# Patient Record
Sex: Female | Born: 1960 | Race: Black or African American | Hispanic: No
Health system: Western US, Academic
[De-identification: ages and names within clinical notes are randomized; demographics above are authoritative.]

## PROBLEM LIST (undated history)

## (undated) DIAGNOSIS — F4310 Post-traumatic stress disorder, unspecified: Secondary | ICD-10-CM

## (undated) DIAGNOSIS — E049 Nontoxic goiter, unspecified: Secondary | ICD-10-CM

## (undated) DIAGNOSIS — M1990 Unspecified osteoarthritis, unspecified site: Secondary | ICD-10-CM

## (undated) DIAGNOSIS — D573 Sickle-cell trait: Secondary | ICD-10-CM

## (undated) DIAGNOSIS — F319 Bipolar disorder, unspecified: Secondary | ICD-10-CM

## (undated) DIAGNOSIS — R7303 Prediabetes: Secondary | ICD-10-CM

## (undated) DIAGNOSIS — E785 Hyperlipidemia, unspecified: Secondary | ICD-10-CM

## (undated) DIAGNOSIS — D571 Sickle-cell disease without crisis: Secondary | ICD-10-CM

## (undated) DIAGNOSIS — F329 Major depressive disorder, single episode, unspecified: Secondary | ICD-10-CM

## (undated) DIAGNOSIS — T7840XA Allergy, unspecified, initial encounter: Secondary | ICD-10-CM

## (undated) DIAGNOSIS — L4050 Arthropathic psoriasis, unspecified: Secondary | ICD-10-CM

## (undated) DIAGNOSIS — F32A Depression, unspecified: Secondary | ICD-10-CM

## (undated) DIAGNOSIS — L409 Psoriasis, unspecified: Secondary | ICD-10-CM

## (undated) HISTORY — DX: Hyperlipidemia, unspecified: E78.5

## (undated) HISTORY — PX: TONSILLECTOMY: SUR1361

## (undated) HISTORY — DX: Post-traumatic stress disorder, unspecified: F43.10

## (undated) HISTORY — DX: Bipolar disorder, unspecified: F31.9

## (undated) HISTORY — PX: COLONOSCOPY: SHX174

## (undated) HISTORY — DX: Psoriasis, unspecified: L40.9

## (undated) HISTORY — DX: Nontoxic goiter, unspecified: E04.9

## (undated) HISTORY — DX: Arthropathic psoriasis, unspecified: L40.50

## (undated) HISTORY — DX: Unspecified osteoarthritis, unspecified site: M19.90

## (undated) HISTORY — DX: Allergy, unspecified, initial encounter: T78.40XA

## (undated) HISTORY — DX: Sickle-cell trait: D57.3

## (undated) HISTORY — DX: Prediabetes: R73.03

## (undated) HISTORY — DX: Depression, unspecified: F32.A

## (undated) HISTORY — PX: OTHER SURGICAL HISTORY: SHX169

## (undated) HISTORY — PX: WISDOM TOOTH EXTRACTION: SHX21

## (undated) HISTORY — PX: KNEE ARTHROSCOPY: SUR90

## (undated) HISTORY — PX: EYE SURGERY: SHX253

---
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
## 1999-01-21 ENCOUNTER — Other Ambulatory Visit: Admission: RE | Admit: 1999-01-21 | Discharge: 1999-01-21 | Payer: Self-pay | Admitting: *Deleted

## 1999-02-09 ENCOUNTER — Ambulatory Visit (HOSPITAL_COMMUNITY): Admission: RE | Admit: 1999-02-09 | Discharge: 1999-02-09 | Payer: Self-pay | Admitting: *Deleted

## 1999-02-09 ENCOUNTER — Encounter: Payer: Self-pay | Admitting: *Deleted

## 1999-04-04 ENCOUNTER — Ambulatory Visit (HOSPITAL_COMMUNITY): Admission: RE | Admit: 1999-04-04 | Discharge: 1999-04-04 | Payer: Self-pay | Admitting: *Deleted

## 1999-04-04 ENCOUNTER — Encounter (INDEPENDENT_AMBULATORY_CARE_PROVIDER_SITE_OTHER): Payer: Self-pay | Admitting: *Deleted

## 1999-09-15 ENCOUNTER — Other Ambulatory Visit: Admission: RE | Admit: 1999-09-15 | Discharge: 1999-09-15 | Payer: Self-pay | Admitting: *Deleted

## 2005-09-06 ENCOUNTER — Encounter: Admission: RE | Admit: 2005-09-06 | Discharge: 2005-09-06 | Payer: Self-pay | Admitting: Obstetrics and Gynecology

## 2006-11-05 ENCOUNTER — Inpatient Hospital Stay (HOSPITAL_COMMUNITY): Admission: RE | Admit: 2006-11-05 | Discharge: 2006-11-07 | Payer: Self-pay | Admitting: Obstetrics and Gynecology

## 2006-11-05 ENCOUNTER — Encounter (HOSPITAL_COMMUNITY): Payer: Self-pay | Admitting: Obstetrics and Gynecology

## 2008-11-05 ENCOUNTER — Other Ambulatory Visit (HOSPITAL_COMMUNITY): Admission: RE | Admit: 2008-11-05 | Discharge: 2008-11-16 | Payer: Self-pay | Admitting: Psychiatry

## 2008-11-06 ENCOUNTER — Ambulatory Visit: Payer: Self-pay | Admitting: Psychiatry

## 2009-10-22 ENCOUNTER — Other Ambulatory Visit: Admission: RE | Admit: 2009-10-22 | Discharge: 2009-10-22 | Payer: Self-pay | Admitting: Family Medicine

## 2010-08-30 NOTE — H&P (Signed)
NAME:  Nichole Fox, Nichole Fox            ACCOUNT NO.:  [id number]   MEDICAL RECORD NO.:  [id number]          PATIENT TYPE:  AMB   LOCATION:                                F[facility]:  [facility]   PHYSICIAN:  Zelphia Cairo, MD    DATE OF BIRTH:  20-Dec-1960   DATE OF ADMISSION:  11/05/2006  DATE OF DISCHARGE:                              HISTORY & PHYSICAL   HISTORY:  A 50 year old African-American female who complains of  increasing dysmenorrhea and menorrhagia.  Ultrasound has shown enlarging  fibroids.  The patient presents today for surgical management.   PAST MEDICAL HISTORY:  Negative.   SURGICAL HISTORY:  1. Tonsillectomy.  2. LASIK eye surgery.   ALLERGIES:  Erythromycin.   MEDICATIONS:  Vitex and multivitamin.   OBSTETRICAL HISTORY:  One vaginal delivery, one miscarriage without  complications.  She does have a history of abnormal Pap smears and is  status post cryotherapy.  Her most recent Pap smear showed low-grade  SIL.  Colposcopy has been performed.   FAMILY HISTORY:  Significant for diabetes, high blood pressure, lung  disease, and thyroid disease   PHYSICAL EXAMINATION:  VITAL SIGNS:  On exam height is 5 feet 4 inches,  weight 197, blood pressure 120/76. Hemoglobin 12.3.  HEAD AND NECK EXAM:  Normal.  No thyromegaly or nodularity.  HEART:  Regular rate and rhythm.  LUNGS:  Clear bilaterally.  ABDOMEN:  Soft, nontender, nondistended.  PELVIC EXAM:  Shows normal external female genitalia.  Vagina and cervix  are normal without lesions.  Uterus is enlarged and slightly tender.  No  adnexal masses are identified.  Ultrasound performed Sep 11, 2006 shows  uterine fibroids, two subserosal fibroids measuring 2.6 and 5.2 cm.  There are also 7.3 cm, 3.2 cm, 3.2 cm, 1.5 cm intramural fibroids.  Bilateral ovaries appear normal by ultrasound.   ASSESSMENT/PLAN:  A 50 year old black female with dysmenorrhea and  pelvic pain who presents today for abdominal myomectomy.     Zelphia Cairo, MD  Electronically Signed    GA/MEDQ  D:  10/26/2006  T:  10/28/2006  Job:  [id number]

## 2010-08-30 NOTE — Op Note (Signed)
NAME:  Nichole Fox, Nichole Fox            ACCOUNT NO.:  [id number]   MEDICAL RECORD NO.:  [id number]          PATIENT TYPE:  INP   LOCATION:  9310                          FACILITY:  [facility]   PHYSICIAN:  Zelphia Cairo, MD    DATE OF BIRTH:  Sep 09, 1960   DATE OF PROCEDURE:  11/06/2006  DATE OF DISCHARGE:                               OPERATIVE REPORT   PREOPERATIVE DIAGNOSIS:  1. Menorrhagia.  2. Fibroid uterus.   POSTOPERATIVE DIAGNOSIS:  1. Menorrhagia.  2. Fibroid uterus.   PROCEDURE:  Was abdominal myomectomy.   SURGEON:  Dr. Renaldo Fiddler   ASSISTANT:  Juluis Mire, M.D.   ESTIMATED BLOOD LOSS:  300 mL   COMPLICATIONS:  None.   CONDITION:  Stable and extubated to recovery room.   PROCEDURE:  The patient was taken to the operating room where general  anesthesia was obtained. She was prepped and draped in sterile fashion  and a Foley catheter was inserted sterilely.  A Pfannenstiel skin  incision was made with a scalpel and this was carried down to the  underlying fascia.  The fascia was incised in the midline.  This was  extended laterally using Mayo scissors.  The fascia was then grasped  with Kocher clamps, tented upwards and dissected off the underlying  rectus muscles using the Bovie.  The Kocher clamps were then moved to  the inferior portion of the fascia which were then tented upwards and  the underlying rectus muscles dissected off using the Bovie.  The  peritoneum was then identified and entered sharply using Metzenbaum  scissors.  This was extended superiorly and inferiorly with good  visualization of the bladder. The fibroid uterus was then delivered  through the abdominal incision.  There was noted the patient had one  large approximately 4.5 cm fibroid located adjacent to the left  fallopian tube.  Another large uterine fibroid approximately 4 cm in  size on the anterior uterine wall.  Multiple smaller fibroids were also  noted throughout the endometrium. An incision  at the transverse incision  over the myoma was made using the scalpel.  The myomatas were then  bluntly dissected free of the myometrium.  The Bovie and Metzenbaum  scissors were also used for sharp dissection at the base.  Once all  palpable myomas were removed from the uterine cavity the uterine defects  were closed in a double layer closure using 2-0 Vicryl.  The pelvis was  then irrigated with warm normal saline. Interceed was placed over the  uterine incisions and the uterus was placed back  into the pelvic cavity.  The fascia was then closed in a running locked  stitch using Monocryl and the skin was closed using 3-0 Vicryl  subcuticular stitch.  The patient tolerated the procedure well.  Sponge,  lap, needle and instrument counts were correct x2.  She was taken to the  recovery room in stable condition.      Zelphia Cairo, MD  Electronically Signed     GA/MEDQ  D:  11/06/2006  T:  11/07/2006  Job:  [id number]

## 2011-01-30 LAB — CBC
HCT: 26.9 — ABNORMAL LOW
Hemoglobin: 8.9 — ABNORMAL LOW
MCHC: 33
MCHC: 33
MCV: 82
MCV: 83.2
Platelets: 231
Platelets: 349
RDW: 13.8
RDW: 13.9

## 2011-01-30 LAB — ABO/RH: ABO/RH(D): O POS

## 2011-01-30 LAB — TYPE AND SCREEN: ABO/RH(D): O POS

## 2011-01-30 LAB — PREGNANCY, URINE: Preg Test, Ur: NEGATIVE

## 2015-02-08 DIAGNOSIS — M25569 Pain in unspecified knee: Secondary | ICD-10-CM | POA: Insufficient documentation

## 2015-02-08 DIAGNOSIS — F319 Bipolar disorder, unspecified: Secondary | ICD-10-CM | POA: Insufficient documentation

## 2015-02-08 DIAGNOSIS — F4310 Post-traumatic stress disorder, unspecified: Secondary | ICD-10-CM | POA: Insufficient documentation

## 2015-03-05 DIAGNOSIS — Z9889 Other specified postprocedural states: Secondary | ICD-10-CM | POA: Insufficient documentation

## 2015-07-28 ENCOUNTER — Ambulatory Visit (HOSPITAL_BASED_OUTPATIENT_CLINIC_OR_DEPARTMENT_OTHER)

## 2015-09-08 DIAGNOSIS — L301 Dyshidrosis [pompholyx]: Secondary | ICD-10-CM | POA: Insufficient documentation

## 2015-09-08 DIAGNOSIS — E041 Nontoxic single thyroid nodule: Secondary | ICD-10-CM | POA: Insufficient documentation

## 2015-09-08 DIAGNOSIS — L732 Hidradenitis suppurativa: Secondary | ICD-10-CM | POA: Insufficient documentation

## 2016-08-31 NOTE — Telephone Encounter (Signed)
Call to patient, Patient is due for health care maintenance, mammogram and pap.  Pt needs appointment with new PCP for transfer  Of care first per office practice.  Left message to call back.

## 2016-09-12 NOTE — Telephone Encounter (Signed)
Pt returned my call.  We are no longer the PCP.  EMR updated

## 2017-09-26 ENCOUNTER — Ambulatory Visit: Admitting: Family

## 2017-09-26 LAB — HX LIPID PANEL
CASE NUMBER: 2019163001148
HX CHOL: 200 mg/dL — NL
HX HDL: 64 mg/dL — NL
HX LDL: 116 mg/dL — NL
HX TRIG: 98 mg/dL — NL

## 2017-09-26 LAB — HX HEMOGLOBIN A1C
CASE NUMBER: 2019163001148
HX EST AVERAGE GLUCOSE (EAG): 131 mg/dL
HX HBF (INTERNAL): 1.1 % — NL
HX HEMOGLOBIN A1C: 6.2 % — ABNORMAL HIGH
HX LA1C (INTERNAL): 1.3 % — NL
HX P3 PEAK (INTERNAL): 2.4 % — NL
HX P4 PEAK (INTERNAL): 1 % — NL
HX TOTAL AREA RANGE (INTERNAL): 1.66 microvolt/sec — NL (ref 1.0–3.5)
HX VARIANT WINDOW: 33.4 % — ABNORMAL HIGH (ref 0.0–1.0)

## 2017-09-26 LAB — HX COMPREHENSIVE METABOLIC PANEL
CASE NUMBER: 2019163001148
HX ALBUMIN LVL: 4 g/dL — NL (ref 3.2–5.0)
HX ALKALINE PHOSPHATASE: 108 U/L — NL (ref 30.0–117.0)
HX ALT: 35 U/L — NL (ref 6.0–55.0)
HX ANION GAP: 3 — NL (ref 3.0–11.0)
HX AST: 21 U/L — NL (ref 6.0–40.0)
HX BILIRUBIN TOTAL: 0.3 mg/dL — NL (ref 0.2–1.2)
HX BUN: 13 mg/dL — NL (ref 6.0–20.0)
HX CALCIUM LVL: 9.8 mg/dL — NL (ref 8.5–10.5)
HX CHLORIDE: 108 mmol/L — NL (ref 98.0–110.0)
HX CO2: 29 mmol/L — NL (ref 21.0–32.0)
HX CREATININE: 0.613 mg/dL — NL (ref 0.55–1.3)
HX GLUCOSE LVL: 90 mg/dL — NL (ref 70.0–110.0)
HX POTASSIUM LVL: 3.8 mmol/L — NL (ref 3.6–5.2)
HX SODIUM LVL: 140 mmol/L — NL (ref 136.0–146.0)
HX TOTAL PROTEIN: 6.8 g/dL — NL (ref 6.0–8.4)

## 2017-09-26 LAB — HX HEPATITIS C ANTIBODY
CASE NUMBER: 2019163001148
HX HEP C AB INST: NONREACTIVE
HX HEP C AB MEASURED: <0.02 — NL (ref 0.0–0.8)
HX HEP C AB: NONREACTIVE

## 2017-09-26 LAB — HX TSH REFLEX PANEL (RECOMMENDED)
CASE NUMBER: 2019163001148
HX 3RD GEN TSH: 2.17 u[IU]/mL — NL (ref 0.358–3.74)

## 2017-09-26 LAB — HX CBC W/ INDICES
CASE NUMBER: 2019163001148
HX ABSOLUTE NRBC COUNT: 0 10*3/uL
HX HCT: 39 % — NL (ref 36.0–47.0)
HX HGB: 12.9 g/dL — NL (ref 11.8–16.0)
HX MCH: 27.2 pg — NL (ref 26.0–34.0)
HX MCHC: 33.1 g/dL — NL (ref 31.0–37.0)
HX MCV: 82.1 fL — NL (ref 80.0–100.0)
HX MPV: 10 fL — NL (ref 9.4–12.4)
HX NRBC PERCENT: 0 % — NL
HX PLATELET: 238 10*3/uL — NL (ref 150.0–400.0)
HX RBC: 4.75 10*6/uL — NL (ref 3.9–5.2)
HX RDW-CV: 13.8 % — NL (ref 11.5–14.5)
HX RDW-SD: 40.9 fL — NL (ref 35.0–51.0)
HX WBC: 4.8 10*3/uL — NL (ref 3.7–11.2)

## 2017-09-26 LAB — HX GLOMERULAR FILTRATION RATE (ESTIMATED)
CASE NUMBER: 2019163001148
HX AFN AMER GLOMERULAR FILTRATION RATE: >90
HX NON-AFN AMER GLOMERULAR FILTRATION RATE: >90

## 2017-09-26 LAB — HX HIV 1/2 ANTIGEN/ANTIBODY COMBINATION ASS
CASE NUMBER: 2019163001149
HX HIV-1/HIV-2 AG/AB COMBO SCREEN: NONREACTIVE

## 2017-09-27 LAB — HX SEXUALLY TRAN DIS (AMP PRB)
CASE NUMBER: 2019163001181
HX C TRACHOMATIS DNA: NOT DETECTED — NL
HX N. GONORRHOEAE DNA: NOT DETECTED — NL
HX TOTAL RLU: 9

## 2017-09-27 LAB — HX RPR
CASE NUMBER: 2019163001148
HX RPR QUAL: NONREACTIVE — NL

## 2017-12-28 ENCOUNTER — Ambulatory Visit (HOSPITAL_BASED_OUTPATIENT_CLINIC_OR_DEPARTMENT_OTHER): Admitting: "Endocrinology

## 2017-12-28 NOTE — Progress Notes (Signed)
Electronically signed by: Geannie Risen on 12/29/2017 12:03 PM    ATTENTION: Wess Botts M.D.  From and electronically signed by: Geannie Risen 12/29/2017 12:03 PM  Electronically signed by: Wess Botts M.D. on 12/30/2017 2:32 PM

## 2017-12-30 ENCOUNTER — Ambulatory Visit (HOSPITAL_BASED_OUTPATIENT_CLINIC_OR_DEPARTMENT_OTHER): Admitting: "Endocrinology

## 2017-12-30 NOTE — Progress Notes (Signed)
Electronically signed by: Geannie Risen on 12/31/2017 8:49 AM    ATTENTION: Wess Botts M.D.  From and electronically signed by: Geannie Risen 12/31/2017 8:49 AM  Electronically signed by: Wess Botts M.D. on 12/31/2017 3:08 PM

## 2018-08-19 ENCOUNTER — Ambulatory Visit: Payer: Self-pay | Admitting: Family Medicine

## 2018-10-07 ENCOUNTER — Telehealth: Payer: Self-pay | Admitting: Family Medicine

## 2018-10-07 NOTE — Telephone Encounter (Signed)
Records received from [facility]. Pt was scheduled for new appt and was cancelled due to [place]. Paper chart was made with records.

## 2018-10-28 ENCOUNTER — Ambulatory Visit: Payer: Self-pay | Admitting: Family Medicine

## 2018-11-18 ENCOUNTER — Ambulatory Visit (INDEPENDENT_AMBULATORY_CARE_PROVIDER_SITE_OTHER): Payer: Medicare Other | Admitting: Family Medicine

## 2018-11-18 ENCOUNTER — Other Ambulatory Visit: Payer: Self-pay

## 2018-11-18 ENCOUNTER — Encounter: Payer: Self-pay | Admitting: Family Medicine

## 2018-11-18 VITALS — BP 138/84 | HR 102 | Temp 98.0°F | Resp 17 | Ht 64.0 in | Wt 241.8 lb

## 2018-11-18 DIAGNOSIS — R632 Polyphagia: Secondary | ICD-10-CM | POA: Diagnosis not present

## 2018-11-18 DIAGNOSIS — R7301 Impaired fasting glucose: Secondary | ICD-10-CM

## 2018-11-18 DIAGNOSIS — R635 Abnormal weight gain: Secondary | ICD-10-CM

## 2018-11-18 DIAGNOSIS — E785 Hyperlipidemia, unspecified: Secondary | ICD-10-CM

## 2018-11-18 DIAGNOSIS — F3170 Bipolar disorder, currently in remission, most recent episode unspecified: Secondary | ICD-10-CM | POA: Diagnosis not present

## 2018-11-18 DIAGNOSIS — L4050 Arthropathic psoriasis, unspecified: Secondary | ICD-10-CM

## 2018-11-18 DIAGNOSIS — L409 Psoriasis, unspecified: Secondary | ICD-10-CM | POA: Insufficient documentation

## 2018-11-18 DIAGNOSIS — Z5181 Encounter for therapeutic drug level monitoring: Secondary | ICD-10-CM

## 2018-11-18 MED ORDER — ZOLPIDEM TARTRATE 10 MG PO TABS: 10.0000 mg | ORAL_TABLET | Freq: Every evening | ORAL | 3 refills | Status: DC | PRN

## 2018-11-18 MED ORDER — BETAMETHASONE DIPROPIONATE 0.05 % EX OINT: TOPICAL_OINTMENT | Freq: Two times a day (BID) | CUTANEOUS | 11 refills | Status: DC

## 2018-11-18 MED ORDER — ARIPIPRAZOLE 10 MG PO TABS: 10.0000 mg | ORAL_TABLET | Freq: Every day | ORAL | 0 refills | Status: DC

## 2018-11-18 MED ORDER — VENLAFAXINE HCL ER 150 MG PO CP24: 150.0000 mg | ORAL_CAPSULE | Freq: Every day | ORAL | 1 refills | Status: DC

## 2018-11-18 MED ORDER — FLUTICASONE FUROATE 27.5 MCG/SPRAY NA SUSP: 2.0000 | Freq: Every day | NASAL | 11 refills | Status: DC

## 2018-11-18 MED ORDER — METFORMIN HCL ER 500 MG PO TB24: 1000.0000 mg | ORAL_TABLET | Freq: Two times a day (BID) | ORAL | 3 refills | Status: DC

## 2018-11-18 MED ORDER — CLOBETASOL PROPIONATE 0.05 % EX OINT: 1.0000 "application " | TOPICAL_OINTMENT | Freq: Two times a day (BID) | CUTANEOUS | 11 refills | Status: AC

## 2018-11-18 NOTE — Patient Instructions (Addendum)
If you have lab work done today you will be contacted with your lab results within the next 2 weeks.  If you have not heard from us then please contact us. The fastest way to get your results is to register for My Chart.   IF you received an x-ray today, you will receive an invoice from [facility]. Please contact [facility] at [phone] with questions or concerns regarding your invoice.   IF you received labwork today, you will receive an invoice from [facility]. Please contact [facility] at [phone] with questions or concerns regarding your invoice.   Our billing staff will not be able to assist you with questions regarding bills from these companies.  You will be contacted with the lab results as soon as they are available. The fastest way to get your results is to activate your My Chart account. Instructions are located on the last page of this paperwork. If you have not heard from us regarding the results in 2 weeks, please contact this office.     Managing Bipolar Disorder When someone is diagnosed with bipolar disorder, the person may be relieved to now know why he or she has felt or behaved a certain way. The person may also feel overwhelmed about the treatment ahead, how to get needed support, and how to deal with the condition each day. With care and support, a person with bipolar disorder can learn to manage his or her symptoms and live with the condition. How to manage lifestyle changes Managing stress Stress is your body's reaction to life changes and events, both good and bad. Stress can play a major role in bipolar disorder, so it is important to learn how to manage stress. Some techniques to help you manage stress include:  Meditation, muscle relaxation, and breathing exercises.  Exercise. Even a short daily walk can help to lower stress levels.  Getting enough good-quality sleep. Too little sleep can cause mania to start (can trigger  mania).  Making a schedule to manage your time. Knowing your daily schedule can help to keep you from feeling overwhelmed by tasks and deadlines.  Spending time on hobbies you enjoy.  Medicines Your health care provider may suggest certain medicines if he or she feels that they will help improve your condition. Avoid using caffeine, alcohol, and other substances that may prevent your medicines from working properly. It is also important to:  Talk with your pharmacist or health care provider about all the medicines that you take, their possible side effects, and which medicines are safe to take together.  Make it your goal to take part in all treatment decisions (shared decision-making). Ask about possible side effects of medicines that your health care provider recommends, and tell him or her how you feel about having those side effects. It is best if shared decision-making with your health care provider is part of your total treatment plan. If you are taking medicines as part of your treatment, do not stop taking medicines before you ask your health care provider if it is safe to stop. You may need to have the medicine slowly decreased (tapered) over time to lower the risk of harmful side effects. Relationships Spend time with people whom you trust and with whom you feel a sense of understanding and calm. Try to find friends or family members who make you feel safe and can help you control feelings of mania. Consider going to couples counseling, family education classes, or family therapy to:  Educate your loved ones about your condition and offer suggestions about how they can support you.  Help resolve conflicts.  Help develop communication skills in your relationships.  How to recognize changes in your condition Everyone responds differently to treatment for bipolar disorder. Some signs that your condition is improving include:  Leveling of your mood. You may have less anger and  excitement about daily activities, and your low moods may not be as bad.  Your symptoms being less intense.  Feeling calm more often.  Thinking clearly.  Not experiencing consequences for extreme behavior.  Feeling like your life is settling down.  Your behavior seeming more normal to you and to other people. Some signs that your condition may be getting worse include:  Sleep problems.  Moods cycling between deep lows and unusually high (excess) energy.  Extreme emotions.  More anger at loved ones.  Staying away from others, or isolating yourself.  A feeling of power or superiority.  Completing a lot of tasks in a very short amount of time.  Unusual thoughts and behaviors.  Suicidal thoughts. Follow these instructions at home: Medicines  Take over-the-counter and prescription medicines only as told by your health care provider or pharmacist.  Ask your pharmacist what over-the-counter cold medicines you should avoid. Some medicines can make symptoms worse. General instructions  Ask for support from trusted family members or friends to make sure you stay on track with your treatment.  Keep a journal to write down your daily moods, medicines, sleep habits, and life events. This may help you have more success with your treatment.  Make and follow a routine for daily meal times. Eat healthy foods, such as whole grains, vegetables, and fresh fruit.  Try to go to sleep and wake up around the same time every day.  Keep all follow-up visits as told by your health care provider. This is important. Where to find support Talking to others  Try making a list of the people you may want to tell about your condition, such as the people you trust most.  Plan what you are willing and not willing to talk about. Think about your needs ahead of time, and how your friends and family members can support you.  Let your loved ones know when they can share advice and when you would just  like them to listen.  Give your loved ones information about bipolar disorder, and encourage them to learn about the condition. Finances Not all insurance plans cover mental health care, so it is important to check with your insurance carrier. If paying for co-pays or counseling services is a problem, search for a local or county mental health care center. Public mental health care services may be offered there at a low cost or no cost when you are not able to see a private health care provider. If you are taking medicine for depression, you may be able to get the generic form, which may be less expensive than brand-name medicine. Some makers of prescription medicines also offer help to patients who cannot afford the medicines they need. Questions to ask your health care provider:  If you are taking medicines: ? How long do I need to take medicine? ? Are there any long-term side effects of my medicine? ? Are there any alternatives to taking medicine?  How would I benefit from therapy?  How often should I follow up with a health care provider? Contact a health care provider if:  Your symptoms get worse  or they do not get better with treatment. Get help right away if:  You have thoughts about harming yourself or others. If you ever feel like you may hurt yourself or others, or have thoughts about taking your own life, get help right away. You can go to your nearest emergency department or call:  Your local emergency services (911 in the [place]  A suicide crisis helpline, such as the National Suicide Prevention Lifeline at 434 269 39581-413-501-9306. This is open 24-hours a day. Summary  Learning ways to manage stress can help to calm you and may also help your treatment work better.  There is a wide range of medicines that can help to treat bipolar disorder.  Having healthy relationships can help to make your moods more stable.  Contact a health care provider if your symptoms get worse or they  do not get better with treatment. This information is not intended to replace advice given to you by your health care provider. Make sure you discuss any questions you have with your health care provider. Document Released: 08/03/2016 Document Revised: 07/26/2018 Document Reviewed: 08/03/2016 Elsevier Patient Education  2020 [employer]

## 2018-11-18 NOTE — Progress Notes (Signed)
New Patient Office Visit  Subjective:  Patient ID: Nichole Fox, female    DOB: 10-Jul-1960  Age: 59 y.o. MRN: [id number]  CC:  Chief Complaint  Patient presents with  . New Patient (Initial Visit)    establish care.  Needs referral for mental health and endocrinology and rheumatology  . Medication Refill    90 day supply on all: venlafaxine, clobetasol, betamethasone, aripiprazole metformin, zolpidem and fluticasone    HPI [name] presents for   Weight Gain Patient reports a 30# weight gain She reports that she is not exercising due to chronic knee issues but feels like she is hungrier than before and is eating more She would like something to cut her appetite She is on metformin for her sugars which incrased on ability '10mg'$  She denies polyuria, polydipsia, +polyphagia  Bipolar Disorder She reports that she is now stable on the venlafaxine and abilify She reports that she previously had depression and prior suicide attempts years ago requiring hospitalization She would like a referral to Psych for counseling and psychiatry   Psoriasis She has a history of rash on her feet and hands are affected by psoriasis She also has psoriatic arthritis She reports that she would like a referral to Rheumatology She is using Clobetasol and Betamethasone for her psoriasis She takes ibuprofen and tylenol when her arthritis gets bad. She also uses aspercreme. She reports that she is on hydroxychloroquine   Past Medical History:  Diagnosis Date  . Allergy   . Arthritis   . Depression   . Psoriasis   . Psoriatic arthritis ([place])   . Sickle cell anemia ([facility])    trait    Past Surgical History:  Procedure Laterality Date  . EYE SURGERY     lasik  . TONSILLECTOMY      Family History  Problem Relation Age of Onset  . Diabetes Mother   . Depression Mother   . Mental illness Mother   . Diabetes Sister     Social History   Socioeconomic History  . Marital status:  Single    Spouse name: Not on file  . Number of children: Not on file  . Years of education: Not on file  . Highest education level: Not on file  Occupational History    Comment: on disability due to mood disorder  Social Needs  . Financial resource strain: Not hard at all  . Food insecurity    Worry: Never true    Inability: Never true  . Transportation needs    Medical: No    Non-medical: No  Tobacco Use  . Smoking status: Never Smoker  . Smokeless tobacco: Never Used  Substance and Sexual Activity  . Alcohol use: Yes    Alcohol/week: 1.0 standard drinks    Types: 1 Glasses of wine per week    Comment: socially  . Drug use: Never  . Sexual activity: Not Currently  Lifestyle  . Physical activity    Days per week: 0 days    Minutes per session: 0 min  . Stress: To some extent  Relationships  . Social connections    Talks on phone: More than three times a week    Gets together: More than three times a week    Attends religious service: More than 4 times per year    Active member of club or organization: Yes    Attends meetings of clubs or organizations: More than 4 times per year    Relationship status:  Divorced  . Intimate partner violence    Fear of current or ex partner: No    Emotionally abused: No    Physically abused: No    Forced sexual activity: No  Other Topics Concern  . Not on file  Social History Narrative  . Not on file    ROS Review of Systems Review of Systems  Constitutional: Negative for activity change, appetite change, chills and fever.  HENT: Negative for congestion, nosebleeds, trouble swallowing and voice change.   Respiratory: Negative for cough, shortness of breath and wheezing.   Gastrointestinal: Negative for diarrhea, nausea and vomiting.  Genitourinary: Negative for difficulty urinating, dysuria, flank pain and hematuria.  Musculoskeletal: Negative for back pain, joint swelling and neck pain.  Neurological: Negative for dizziness,  speech difficulty, light-headedness and numbness.  See HPI. All other review of systems negative.   Objective:   Today's Vitals: BP 138/84 (BP Location: Left Arm, Patient Position: Sitting, Cuff Size: Large)   Pulse (!) 102   Temp 98 F (36.7 C) (Oral)   Resp 17   Ht '5\' 4"'$  (1.626 m)   Wt 241 lb 12.8 oz (109.7 kg)   SpO2 95%   BMI 41.50 kg/m   Physical Exam Physical Exam  Constitutional: Oriented to person, place, and time. Appears well-developed and well-nourished.  HENT:  Head: Normocephalic and atraumatic.  Eyes: Conjunctivae and EOM are normal.  Cardiovascular: Normal rate, regular rhythm, normal heart sounds and intact distal pulses.  No murmur heard. Pulmonary/Chest: Effort normal and breath sounds normal. No stridor. No respiratory distress. Has no wheezes.  Neurological: Is alert and oriented to person, place, and time.  Skin: Skin is warm. Capillary refill takes less than 2 seconds.  Psychiatric: Has a normal mood and affect. Behavior is normal. Judgment and thought content normal.   Assessment & Plan:   Problem List Items Addressed This Visit      Musculoskeletal and Integument   Psoriatic arthritis ([place]) - Primary - will refer to Rheumatology for continuity   Relevant Orders   Ambulatory referral to Rheumatology   CBC     Other   Bipolar affective disorder ([place])  - discussed that she should establish with psychiatry and psychology Patient informed that her records indicate that she has Bipolar disorder and not just depression   Relevant Orders   Ambulatory referral to Psychiatry   Ambulatory referral to Psychology   Lipid panel   Hemoglobin A1c   CMP14+EGFR   TSH   CBC    Other Visit Diagnoses    Increased appetite    -  Discussed that stimulant meds like phentermine can trigger mania Would recommend a weight watchers approach   Relevant Orders   Lipid panel   Hemoglobin A1c   CMP14+EGFR   TSH   CBC   Weight gain    - will assess for diabetes    Relevant Orders   Lipid panel   Hemoglobin A1c   CMP14+EGFR   TSH   Impaired fasting glucose       Relevant Orders   Lipid panel   Hemoglobin A1c   CMP14+EGFR   TSH   Encounter for medication monitoring       Relevant Orders   Lipid panel   Hemoglobin A1c   CMP14+EGFR   TSH   CBC   Morbid obesity ([place])    - discussed weight watchers   Relevant Medications   metFORMIN (GLUCOPHAGE-XR) 500 MG 24 hr tablet  Outpatient Encounter Medications as of 11/18/2018  Medication Sig  . ARIPiprazole (ABILIFY) 10 MG tablet Take 1 tablet (10 mg total) by mouth daily.  . betamethasone dipropionate (DIPROLENE) 0.05 % ointment Apply topically 2 (two) times daily.  . clobetasol ointment (TEMOVATE) 1.61 % Apply 1 application topically 2 (two) times daily.  . fluticasone (VERAMYST) 27.5 MCG/SPRAY nasal spray Place 2 sprays into the nose daily.  Marland Kitchen venlafaxine XR (EFFEXOR-XR) 150 MG 24 hr capsule Take 1 capsule (150 mg total) by mouth daily with breakfast.  . zolpidem (AMBIEN) 10 MG tablet Take 1 tablet (10 mg total) by mouth at bedtime as needed for sleep.  . [DISCONTINUED] ARIPiprazole (ABILIFY) 10 MG tablet Take 10 mg by mouth daily.  . [DISCONTINUED] betamethasone dipropionate (DIPROLENE) 0.05 % ointment Apply topically 2 (two) times daily.  . [DISCONTINUED] clobetasol ointment (TEMOVATE) 0.96 % Apply 1 application topically 2 (two) times daily.  . [DISCONTINUED] fluticasone (VERAMYST) 27.5 MCG/SPRAY nasal spray Place 2 sprays into the nose daily.  . [DISCONTINUED] metFORMIN (GLUMETZA) 1000 MG (MOD) 24 hr tablet Take 1,000 mg by mouth daily with breakfast.  . [DISCONTINUED] venlafaxine XR (EFFEXOR-XR) 150 MG 24 hr capsule Take 150 mg by mouth daily with breakfast.  . [DISCONTINUED] zolpidem (AMBIEN) 10 MG tablet Take 10 mg by mouth at bedtime as needed for sleep.  . metFORMIN (GLUCOPHAGE-XR) 500 MG 24 hr tablet Take 2 tablets (1,000 mg total) by mouth 2 (two) times daily with breakfast and lunch.    No facility-administered encounter medications on file as of 11/18/2018.     Follow-up: No follow-ups on file.   Forrest Moron, MD

## 2018-11-19 ENCOUNTER — Telehealth: Payer: Self-pay | Admitting: Family Medicine

## 2018-11-19 ENCOUNTER — Encounter: Payer: Self-pay | Admitting: Family Medicine

## 2018-11-19 DIAGNOSIS — E785 Hyperlipidemia, unspecified: Secondary | ICD-10-CM | POA: Insufficient documentation

## 2018-11-19 LAB — TSH: TSH: 1.2 u[IU]/mL (ref 0.450–4.500)

## 2018-11-19 LAB — CMP14+EGFR
ALT: 46 IU/L — ABNORMAL HIGH (ref 0–32)
AST: 33 IU/L (ref 0–40)
Albumin/Globulin Ratio: 2.4 — ABNORMAL HIGH (ref 1.2–2.2)
Albumin: 4.7 g/dL (ref 3.8–4.9)
Alkaline Phosphatase: 110 IU/L (ref 39–117)
BUN/Creatinine Ratio: 16 (ref 9–23)
BUN: 9 mg/dL (ref 6–24)
Bilirubin Total: 0.3 mg/dL (ref 0.0–1.2)
CO2: 24 mmol/L (ref 20–29)
Calcium: 10.1 mg/dL (ref 8.7–10.2)
Chloride: 105 mmol/L (ref 96–106)
Creatinine, Ser: 0.58 mg/dL (ref 0.57–1.00)
GFR calc Af Amer: 119 mL/min/{1.73_m2} (ref >59)
GFR calc non Af Amer: 103 mL/min/{1.73_m2} (ref >59)
Globulin, Total: 2 g/dL (ref 1.5–4.5)
Glucose: 88 mg/dL (ref 65–99)
Potassium: 3.9 mmol/L (ref 3.5–5.2)
Sodium: 144 mmol/L (ref 134–144)
Total Protein: 6.7 g/dL (ref 6.0–8.5)

## 2018-11-19 LAB — CBC
Hematocrit: 38.9 % (ref 34.0–46.6)
Hemoglobin: 12.2 g/dL (ref 11.1–15.9)
MCH: 26.8 pg (ref 26.6–33.0)
MCHC: 31.4 g/dL — ABNORMAL LOW (ref 31.5–35.7)
MCV: 86 fL (ref 79–97)
Platelets: 244 10*3/uL (ref 150–450)
RBC: 4.55 x10E6/uL (ref 3.77–5.28)
RDW: 14.3 % (ref 11.7–15.4)
WBC: 5.8 10*3/uL (ref 3.4–10.8)

## 2018-11-19 LAB — LIPID PANEL
Chol/HDL Ratio: 4.2 ratio (ref 0.0–4.4)
Cholesterol, Total: 250 mg/dL — ABNORMAL HIGH (ref 100–199)
HDL: 60 mg/dL (ref >39)
LDL Calculated: 171 mg/dL — ABNORMAL HIGH (ref 0–99)
Triglycerides: 94 mg/dL (ref 0–149)
VLDL Cholesterol Cal: 19 mg/dL (ref 5–40)

## 2018-11-19 LAB — HEMOGLOBIN A1C
Est. average glucose Bld gHb Est-mCnc: 120 mg/dL
Hgb A1c MFr Bld: 5.8 % — ABNORMAL HIGH (ref 4.8–5.6)

## 2018-11-19 NOTE — Telephone Encounter (Signed)
Sent My-Chart message

## 2018-11-20 ENCOUNTER — Other Ambulatory Visit: Payer: Self-pay

## 2018-11-20 DIAGNOSIS — Z20822 Contact with and (suspected) exposure to covid-19: Secondary | ICD-10-CM

## 2018-11-22 ENCOUNTER — Encounter: Payer: Self-pay | Admitting: Family Medicine

## 2018-11-22 LAB — NOVEL CORONAVIRUS, NAA: SARS-CoV-2, NAA: NOT DETECTED

## 2018-11-24 ENCOUNTER — Encounter: Payer: Self-pay | Admitting: Family Medicine

## 2018-11-25 ENCOUNTER — Telehealth: Payer: Self-pay | Admitting: Family Medicine

## 2018-11-25 ENCOUNTER — Other Ambulatory Visit: Payer: Self-pay | Admitting: Family Medicine

## 2018-11-25 ENCOUNTER — Encounter: Payer: Self-pay | Admitting: Family Medicine

## 2018-11-25 DIAGNOSIS — R635 Abnormal weight gain: Secondary | ICD-10-CM

## 2018-11-25 NOTE — Telephone Encounter (Signed)
Patient is calling to request through her [place] that has a program renew active that will support her going to the gym.  The patient is requesting a prescription to the [employer] to work out.  Patient is requesting a call when the prescription is ready for pick up.  Pleae advise CB- [phone]

## 2018-11-25 NOTE — Telephone Encounter (Signed)
Medication: betamethasone dipropionate (DIPROLENE) 0.05 % ointment [[id number]]  Patient states that she should be receiving 45 g  ° °Has the patient contacted their pharmacy? Yes  °(Agent: If no, request that the patient contact the pharmacy for the refill.) °(Agent: If yes, when and what did the pharmacy advise?) ° °Preferred Pharmacy (with phone number or street name):  °[id number] - [place], [place] - [address] [phone] (Phone) °[phone] (Fax) ° ° °Agent: Please be advised that RX refills may take up to 3 business days. We ask that you follow-up with your pharmacy. °

## 2018-11-25 NOTE — Telephone Encounter (Signed)
Requested medications are due for refill today?  No  Requested medications are on the active medication list?  Yes  Last refill: 11/18/2018  Future visit scheduled?  Yes- 02/18/2019  Notes to clinic: Patient is requesting 45 gram tube.   Requested Prescriptions  Pending Prescriptions Disp Refills   betamethasone dipropionate (DIPROLENE) 0.05 % ointment 30 g 11    Sig: Apply topically 2 (two) times daily.     Off-Protocol Failed - 11/25/2018  3:00 PM      Failed - Medication not assigned to a protocol, review manually.      Passed - Valid encounter within last 12 months    Recent Outpatient Visits          1 week ago Psoriatic arthritis [facility])   [facility] [place], [place]      Future Appointments            In 1 month Deveshwar, Abel Presto, MD [place]   In 2 months Forrest Moron, MD [facility] at [place], [facility]

## 2018-11-25 NOTE — Telephone Encounter (Signed)
Medication: betamethasone dipropionate (DIPROLENE) 0.05 % ointment [[id number]]  Patient states that she should be receiving 45 g   Has the patient contacted their pharmacy? Yes  (Agent: If no, request that the patient contact the pharmacy for the refill.) (Agent: If yes, when and what did the pharmacy advise?)  Preferred Pharmacy (with phone number or street name):  [facility] [phone] - [place], [place] - [address] [phone] (Phone) [phone] (Fax)   Agent: Please be advised that RX refills may take up to 3 business days. We ask that you follow-up with your pharmacy.

## 2018-11-27 MED ORDER — BETAMETHASONE DIPROPIONATE 0.05 % EX OINT: TOPICAL_OINTMENT | Freq: Two times a day (BID) | CUTANEOUS | 6 refills | Status: DC

## 2018-11-27 NOTE — Telephone Encounter (Signed)
Completed paper script. Please scan a copy and call the patient to pick up the original.

## 2018-11-27 NOTE — Addendum Note (Signed)
Addended by: Delia Chimes A on: 11/27/2018 02:44 PM   Modules accepted: Orders

## 2018-11-27 NOTE — Telephone Encounter (Signed)
New tube sent in with refills to pharmacy on file. Please notify the patient.

## 2018-11-27 NOTE — Telephone Encounter (Signed)
Tried contacting pt no answer and no voicemail option. Copy made for scanning and original with Delores. Dgaddy, CMA

## 2018-12-01 NOTE — Telephone Encounter (Signed)
Pt picked up original script on 11/29/2018.

## 2018-12-03 ENCOUNTER — Ambulatory Visit (INDEPENDENT_AMBULATORY_CARE_PROVIDER_SITE_OTHER): Payer: Medicare Other | Admitting: Psychology

## 2018-12-03 DIAGNOSIS — F3189 Other bipolar disorder: Secondary | ICD-10-CM

## 2018-12-12 ENCOUNTER — Ambulatory Visit (INDEPENDENT_AMBULATORY_CARE_PROVIDER_SITE_OTHER): Payer: Medicare Other | Admitting: Psychology

## 2018-12-12 DIAGNOSIS — F3189 Other bipolar disorder: Secondary | ICD-10-CM

## 2018-12-15 ENCOUNTER — Other Ambulatory Visit: Payer: Self-pay | Admitting: Family Medicine

## 2018-12-18 NOTE — Progress Notes (Signed)
Office Visit Note  Patient: Nichole Fox Wilbert             Date of Birth: 10/11/1960           MRN: [id number]             PCP: Doristine BosworthStallings, Zoe A, MD Referring: Doristine BosworthStallings, Zoe A, MD Visit Date: 01/01/2019 Occupation: disability  Subjective:  Pain in multiple joints.   History of Present Illness: Nichole Fox Rotert is a 58 y.o. female seen in consultation per request of her PCP.  According to patient she started having rashes when she was a child and was diagnosed with eczema.  In her 720s she was seen by dermatologist who diagnosed her with psoriasis.  She was given topical agents and light therapy for many years.  She has not been under care of a dermatologist recently.  She states for the last 1 year she has been experiencing increased pain in her hands.  She states her hands lock up at times.  She has not seen any visible swelling in her hands.  She also gives history of intermittent pain in her left knee joint where she had meniscal tear repair in the past.  She has lower back pain for the last month.  She states she has gained about 30 pounds since the onset of the pandemic and it is hard for her to tell if there is any swelling in her joints.  She continues to have psoriasis flares.  She also gives history of hidradenitis involving her inguinal region.  She states she has recurrent flares.  Last flare was last month.  She usually uses topical clindamycin to clear it up.  Activities of Daily Living:  Patient reports morning stiffness for several hours.   Patient Denies nocturnal pain.  Difficulty dressing/grooming: Denies Difficulty climbing stairs: Reports Difficulty getting out of chair: Reports Difficulty using hands for taps, buttons, cutlery, and/or writing: Reports  Review of Systems  Constitutional: Positive for fatigue and weight gain. Negative for night sweats and weight loss.  HENT: Negative for mouth sores, trouble swallowing, trouble swallowing, mouth dryness and nose dryness.    Eyes: Negative for pain, redness, itching, visual disturbance and dryness.  Respiratory: Negative for cough, shortness of breath and difficulty breathing.   Cardiovascular: Negative for chest pain, palpitations, hypertension, irregular heartbeat and swelling in legs/feet.  Gastrointestinal: Positive for constipation. Negative for abdominal pain, blood in stool and diarrhea.  Endocrine: Negative for increased urination.  Genitourinary: Negative for difficulty urinating, painful urination and vaginal dryness.  Musculoskeletal: Positive for arthralgias, joint pain and morning stiffness. Negative for joint swelling, myalgias, muscle weakness, muscle tenderness and myalgias.  Skin: Positive for rash. Negative for color change, hair loss, skin tightness, ulcers and sensitivity to sunlight.  Allergic/Immunologic: Negative for susceptible to infections.  Neurological: Positive for memory loss. Negative for dizziness, numbness, headaches, night sweats and weakness.  Hematological: Negative for bruising/bleeding tendency and swollen glands.  Psychiatric/Behavioral: Positive for depressed mood and sleep disturbance. Negative for confusion. The patient is nervous/anxious.     PMFS History:  Patient Active Problem List   Diagnosis Date Noted  . Dyslipidemia 11/19/2018  . Psoriatic arthritis (HCC)   . Thyroid nodule 09/08/2015  . 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 Hidradenitis 09/08/2015  . S/P arthroscopic surgery of left knee 03/05/2015  . Bipolar affective disorder (HCC) 02/08/2015  . PTSD (post-traumatic stress disorder) 02/08/2015  . Lateral knee pain 02/08/2015    Past Medical History:  Diagnosis Date  .  Allergy   . Arthritis   . Depression   . Psoriasis   . Psoriatic arthritis ([place])   . Sickle cell anemia (HCC)    trait    Family History  Problem Relation Age of Onset  . Diabetes Mother   . Depression Mother   . Mental illness Mother   . Thyroid disease Father   .  Anemia Father    Past Surgical History:  Procedure Laterality Date  . abortions     x2  . COLONOSCOPY    . EYE SURGERY     lasik  . TONSILLECTOMY     Social History   Social History Narrative  . Not on file   Immunization History  Administered Date(s) Administered  . Tdap 02/24/2015     Objective: Vital Signs: BP (!) 145/82 (BP Location: Right Wrist, Patient Position: Sitting, Cuff Size: Normal)   Pulse 83   Resp 15   Ht 5\' 5"  (1.651 m)   Wt 245 lb (111.1 kg)   BMI 40.77 kg/m    Physical Exam Vitals signs and nursing note reviewed.  Constitutional:      Appearance: She is well-developed.  HENT:     Head: Normocephalic and atraumatic.  Eyes:     Conjunctiva/sclera: Conjunctivae normal.  Neck:     Musculoskeletal: Normal range of motion.  Cardiovascular:     Rate and Rhythm: Normal rate and regular rhythm.     Heart sounds: Normal heart sounds.  Pulmonary:     Effort: Pulmonary effort is normal.     Breath sounds: Normal breath sounds.  Abdominal:     General: Bowel sounds are normal.     Palpations: Abdomen is soft.  Lymphadenopathy:     Cervical: No cervical adenopathy.  Skin:    General: Skin is warm and dry.     Capillary Refill: Capillary refill takes less than 2 seconds.     Comments: She has hyperpigmented pigmented dry scales on the dorsum of her right foot.  She also has dry scales on her bilateral hands and bilateral feet.  No other lesions were noted.  Neurological:     Mental Status: She is alert and oriented to person, place, and time.  Psychiatric:        Behavior: Behavior normal.      Musculoskeletal Exam: C-spine thoracic and lumbar spine with good range of motion.  She had no SI joint tenderness.  Shoulder joints, elbow joints, wrist joints, MCPs PIPs DIPs with good range of motion.  She had mild tenderness on palpation of her PIP joints of her hands.  Hip joints knee joints, ankles or MTPs PIPs with good range of motion.  She has some  discomfort range of motion of her left knee joint with crepitus.  No warmth swelling or effusion was noted.  CDAI Exam: CDAI Score: - Patient Global: -; Provider Global: - Swollen: -; Tender: - Joint Exam   No joint exam has been documented for this visit   There is currently no information documented on the homunculus. Go to the Rheumatology activity and complete the homunculus joint exam.  Investigation: No additional findings.  Imaging: Xr Hand 2 View Left  Result Date: 01/01/2019 No PIP DIP, CMC, intercarpal radiocarpal joint space narrowing was noted.  No erosive changes were noted. Impression: Unremarkable x-ray of the hand.  Xr Hand 2 View Right  Result Date: 01/01/2019 No MCP, PIP or DIP joint space narrowing was noted.  No intercarpal radiocarpal joint space  narrowing was noted.  No erosive changes were noted. Impression: X-ray was consistent with mild osteoarthritic changes.  Xr Knee 3 View Left  Result Date: 01/01/2019 Moderate medial compartment narrowing with medial osteophytes and intercondylar osteophytes was noted.  No chondrocalcinosis was noted.  Severe patellofemoral narrowing was noted. Impression: These findings are consistent with moderate osteoarthritis and severe chondromalacia patella.   Recent Labs: Lab Results  Component Value Date   WBC 5.8 11/18/2018   HGB 12.2 11/18/2018   PLT 244 11/18/2018   NA 144 11/18/2018   K 3.9 11/18/2018   CL 105 11/18/2018   CO2 24 11/18/2018   GLUCOSE 88 11/18/2018   BUN 9 11/18/2018   CREATININE 0.58 11/18/2018   BILITOT 0.3 11/18/2018   ALKPHOS 110 11/18/2018   AST 33 11/18/2018   ALT 46 (H) 11/18/2018   PROT 6.7 11/18/2018   ALBUMIN 4.7 11/18/2018   CALCIUM 10.1 11/18/2018   GFRAA 119 11/18/2018    Speciality Comments: No specialty comments available.  Procedures:  No procedures performed Allergies: Erythromycin, Lithium, Shellfish allergy, and Strawberry extract   Assessment / Plan:     Visit  Diagnoses: Pain in both hands -patient complains of increased pain and stiffness in her hands for the last 1 year.  The clinical findings and the radiographic findings are consistent with mild osteoarthritis.  I do not see any synovitis on examination.  There were no radiographic changes.  I have advised her to contact me in case she develops any increased swelling.  Plan: XR Hand 2 View Right, XR Hand 2 View Left, I will obtain following labs today.  Sedimentation rate, Rheumatoid factor, Cyclic citrul peptide antibody, IgG, Uric acid, 14-3-3 eta Protein.according to patient Dr. Gwyneth Sprout at [facility] in [place].  Diagnosed her with psoriatic arthritis and started her on Plaquenil.  Chronic pain of left knee -she has moderate osteoarthritis and severe chondromalacia patella on the radiographic examination.  She may benefit from knee joint aches strengthening exercises.  Have given her a handout.  Plan: XR KNEE 3 VIEW LEFT.  Weight loss diet and exercise was discussed.  We will discuss cortisone injection and possible Visco supplement injections at the follow-up visit.  Psoriasis-patient gives history of psoriasis since she was in her 12s.  She has been treated with topical agents and prover therapy.  Per her request I will refer her to dermatology.  Hidradenitis - inguinal, recurrent.  Patient states she really responds to topical clindamycin.  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  S/P arthroscopic surgery of left knee  PTSD (post-traumatic stress disorder)  Bipolar affective disorder in remission (HCC)  Dyslipidemia  Thyroid nodule  Orders: Orders Placed This Encounter  Procedures  . XR Hand 2 View Right  . XR Hand 2 View Left  . XR KNEE 3 VIEW LEFT  . Sedimentation rate  . Rheumatoid factor  . Cyclic citrul peptide antibody, IgG  . Uric acid  . 14-3-3 eta Protein  . Ambulatory referral to Dermatology   No orders of the defined types  were placed in this encounter.   Face-to-face time spent with patient was 45 minutes. Greater than 50% of time was spent in counseling and coordination of care.  Follow-Up Instructions: Return for Osteoarthritis, psoriasis.   Pollyann Savoy, MD  Note - This record has been created using Animal nutritionist.  Chart creation errors have been sought, but may not always  have been located. Such creation errors do not  reflect on  the standard of medical care.

## 2018-12-25 ENCOUNTER — Ambulatory Visit: Payer: Self-pay | Admitting: Psychology

## 2019-01-01 ENCOUNTER — Ambulatory Visit: Payer: Self-pay

## 2019-01-01 ENCOUNTER — Other Ambulatory Visit: Payer: Self-pay

## 2019-01-01 ENCOUNTER — Encounter: Payer: Self-pay | Admitting: Rheumatology

## 2019-01-01 ENCOUNTER — Ambulatory Visit (INDEPENDENT_AMBULATORY_CARE_PROVIDER_SITE_OTHER): Payer: Medicare Other | Admitting: Rheumatology

## 2019-01-01 VITALS — BP 145/82 | HR 83 | Resp 15 | Ht 65.0 in | Wt 245.0 lb

## 2019-01-01 DIAGNOSIS — M79642 Pain in left hand: Secondary | ICD-10-CM | POA: Diagnosis not present

## 2019-01-01 DIAGNOSIS — L409 Psoriasis, unspecified: Secondary | ICD-10-CM | POA: Diagnosis not present

## 2019-01-01 DIAGNOSIS — L732 Hidradenitis suppurativa: Secondary | ICD-10-CM

## 2019-01-01 DIAGNOSIS — G8929 Other chronic pain: Secondary | ICD-10-CM

## 2019-01-01 DIAGNOSIS — E041 Nontoxic single thyroid nodule: Secondary | ICD-10-CM

## 2019-01-01 DIAGNOSIS — M25562 Pain in left knee: Secondary | ICD-10-CM | POA: Diagnosis not present

## 2019-01-01 DIAGNOSIS — L301 Dyshidrosis [pompholyx]: Secondary | ICD-10-CM

## 2019-01-01 DIAGNOSIS — M79641 Pain in right hand: Secondary | ICD-10-CM

## 2019-01-01 DIAGNOSIS — F4310 Post-traumatic stress disorder, unspecified: Secondary | ICD-10-CM

## 2019-01-01 DIAGNOSIS — Z9889 Other specified postprocedural states: Secondary | ICD-10-CM

## 2019-01-01 DIAGNOSIS — F3170 Bipolar disorder, currently in remission, most recent episode unspecified: Secondary | ICD-10-CM

## 2019-01-01 DIAGNOSIS — E785 Hyperlipidemia, unspecified: Secondary | ICD-10-CM

## 2019-01-01 NOTE — Patient Instructions (Signed)
Hand Exercises Hand exercises can be helpful for almost anyone. These exercises can strengthen the hands, improve flexibility and movement, and increase blood flow to the hands. These results can make work and daily tasks easier. Hand exercises can be especially helpful for people who have joint pain from arthritis or have nerve damage from overuse (carpal tunnel syndrome). These exercises can also help people who have injured a hand. Exercises Most of these hand exercises are gentle stretching and motion exercises. It is usually safe to do them often throughout the day. Warming up your hands before exercise may help to reduce stiffness. You can do this with gentle massage or by placing your hands in warm water for 10-15 minutes. It is normal to feel some stretching, pulling, tightness, or mild discomfort as you begin new exercises. This will gradually improve. Stop an exercise right away if you feel sudden, severe pain or your pain gets worse. Ask your health care provider which exercises are best for you. Knuckle bend or "claw" fist 1. Stand or sit with your arm, hand, and all five fingers pointed straight up. Make sure to keep your wrist straight during the exercise. 2. Gently bend your fingers down toward your palm until the tips of your fingers are touching the top of your palm. Keep your big knuckle straight and just bend the small knuckles in your fingers. 3. Hold this position for __________ seconds. 4. Straighten (extend) your fingers back to the starting position. Repeat this exercise 5-10 times with each hand. Full finger fist 1. Stand or sit with your arm, hand, and all five fingers pointed straight up. Make sure to keep your wrist straight during the exercise. 2. Gently bend your fingers into your palm until the tips of your fingers are touching the middle of your palm. 3. Hold this position for __________ seconds. 4. Extend your fingers back to the starting position, stretching every  joint fully. Repeat this exercise 5-10 times with each hand. Straight fist 1. Stand or sit with your arm, hand, and all five fingers pointed straight up. Make sure to keep your wrist straight during the exercise. 2. Gently bend your fingers at the big knuckle, where your fingers meet your hand, and the middle knuckle. Keep the knuckle at the tips of your fingers straight and try to touch the bottom of your palm. 3. Hold this position for __________ seconds. 4. Extend your fingers back to the starting position, stretching every joint fully. Repeat this exercise 5-10 times with each hand. Tabletop 1. Stand or sit with your arm, hand, and all five fingers pointed straight up. Make sure to keep your wrist straight during the exercise. 2. Gently bend your fingers at the big knuckle, where your fingers meet your hand, as far down as you can while keeping the small knuckles in your fingers straight. Think of forming a tabletop with your fingers. 3. Hold this position for __________ seconds. 4. Extend your fingers back to the starting position, stretching every joint fully. Repeat this exercise 5-10 times with each hand. Finger spread 1. Place your hand flat on a table with your palm facing down. Make sure your wrist stays straight as you do this exercise. 2. Spread your fingers and thumb apart from each other as far as you can until you feel a gentle stretch. Hold this position for __________ seconds. 3. Bring your fingers and thumb tight together again. Hold this position for __________ seconds. Repeat this exercise 5-10 times with each hand.   Making circles 1. Stand or sit with your arm, hand, and all five fingers pointed straight up. Make sure to keep your wrist straight during the exercise. 2. Make a circle by touching the tip of your thumb to the tip of your index finger. 3. Hold for __________ seconds. Then open your hand wide. 4. Repeat this motion with your thumb and each finger on your hand.  Repeat this exercise 5-10 times with each hand. Thumb motion 1. Sit with your forearm resting on a table and your wrist straight. Your thumb should be facing up toward the ceiling. Keep your fingers relaxed as you move your thumb. 2. Lift your thumb up as high as you can toward the ceiling. Hold for __________ seconds. 3. Bend your thumb across your palm as far as you can, reaching the tip of your thumb for the small finger (pinkie) side of your palm. Hold for __________ seconds. Repeat this exercise 5-10 times with each hand. Grip strengthening  1. Hold a stress ball or other soft ball in the middle of your hand. 2. Slowly increase the pressure, squeezing the ball as much as you can without causing pain. Think of bringing the tips of your fingers into the middle of your palm. All of your finger joints should bend when doing this exercise. 3. Hold your squeeze for __________ seconds, then relax. Repeat this exercise 5-10 times with each hand. Contact a health care provider if:  Your hand pain or discomfort gets much worse when you do an exercise.  Your hand pain or discomfort does not improve within 2 hours after you exercise. If you have any of these problems, stop doing these exercises right away. Do not do them again unless your health care provider says that you can. Get help right away if:  You develop sudden, severe hand pain or swelling. If this happens, stop doing these exercises right away. Do not do them again unless your health care provider says that you can. This information is not intended to replace advice given to you by your health care provider. Make sure you discuss any questions you have with your health care provider. Document Released: 03/15/2015 Document Revised: 07/25/2018 Document Reviewed: 04/04/2018 Elsevier Patient Education  2020 [employer] Inc[employer] Journal for Nurse Practitioners, 15(4), 263-267. Retrieved January 21, 2018 from http://clinicalkey.com/nursing">  Knee  Exercises Ask your health care provider which exercises are safe for you. Do exercises exactly as told by your health care provider and adjust them as directed. It is normal to feel mild stretching, pulling, tightness, or discomfort as you do these exercises. Stop right away if you feel sudden pain or your pain gets worse. Do not begin these exercises until told by your health care provider. Stretching and range-of-motion exercises These exercises warm up your muscles and joints and improve the movement and flexibility of your knee. These exercises also help to relieve pain and swelling. Knee extension, prone 5. Lie on your abdomen (prone position) on a bed. 6. Place your left / right knee just beyond the edge of the surface so your knee is not on the bed. You can put a towel under your left / right thigh just above your kneecap for comfort. 7. Relax your leg muscles and allow gravity to straighten your knee (extension). You should feel a stretch behind your left / right knee. 8. Hold this position for __________ seconds. 9. Scoot up so your knee is supported between repetitions. Repeat __________ times. Complete this exercise __________ times   a day. Knee flexion, active  5. Lie on your back with both legs straight. If this causes back discomfort, bend your left / right knee so your foot is flat on the floor. 6. Slowly slide your left / right heel back toward your buttocks. Stop when you feel a gentle stretch in the front of your knee or thigh (flexion). 7. Hold this position for __________ seconds. 8. Slowly slide your left / right heel back to the starting position. Repeat __________ times. Complete this exercise __________ times a day. Quadriceps stretch, prone  5. Lie on your abdomen on a firm surface, such as a bed or padded floor. 6. Bend your left / right knee and hold your ankle. If you cannot reach your ankle or pant leg, loop a belt around your foot and grab the belt instead. 7.  Gently pull your heel toward your buttocks. Your knee should not slide out to the side. You should feel a stretch in the front of your thigh and knee (quadriceps). 8. Hold this position for __________ seconds. Repeat __________ times. Complete this exercise __________ times a day. Hamstring, supine 1. Lie on your back (supine position). 2. Loop a belt or towel over the ball of your left / right foot. The ball of your foot is on the walking surface, right under your toes. 3. Straighten your left / right knee and slowly pull on the belt to raise your leg until you feel a gentle stretch behind your knee (hamstring). ? Do not let your knee bend while you do this. ? Keep your other leg flat on the floor. 4. Hold this position for __________ seconds. Repeat __________ times. Complete this exercise __________ times a day. Strengthening exercises These exercises build strength and endurance in your knee. Endurance is the ability to use your muscles for a long time, even after they get tired. Quadriceps, isometric This exercise stretches the muscles in front of your thigh (quadriceps) without moving your knee joint (isometric). 4. Lie on your back with your left / right leg extended and your other knee bent. Put a rolled towel or small pillow under your knee if told by your health care provider. 5. Slowly tense the muscles in the front of your left / right thigh. You should see your kneecap slide up toward your hip or see increased dimpling just above the knee. This motion will push the back of the knee toward the floor. 6. For __________ seconds, hold the muscle as tight as you can without increasing your pain. 7. Relax the muscles slowly and completely. Repeat __________ times. Complete this exercise __________ times a day. Straight leg raises This exercise stretches the muscles in front of your thigh (quadriceps) and the muscles that move your hips (hip flexors). 5. Lie on your back with your left /  right leg extended and your other knee bent. 6. Tense the muscles in the front of your left / right thigh. You should see your kneecap slide up or see increased dimpling just above the knee. Your thigh may even shake a bit. 7. Keep these muscles tight as you raise your leg 4-6 inches (10-15 cm) off the floor. Do not let your knee bend. 8. Hold this position for __________ seconds. 9. Keep these muscles tense as you lower your leg. 10. Relax your muscles slowly and completely after each repetition. Repeat __________ times. Complete this exercise __________ times a day. Hamstring, isometric 4. Lie on your back on a firm surface. 5.   Bend your left / right knee about __________ degrees. 6. Dig your left / right heel into the surface as if you are trying to pull it toward your buttocks. Tighten the muscles in the back of your thighs (hamstring) to "dig" as hard as you can without increasing any pain. 7. Hold this position for __________ seconds. 8. Release the tension gradually and allow your muscles to relax completely for __________ seconds after each repetition. Repeat __________ times. Complete this exercise __________ times a day. Hamstring curls If told by your health care provider, do this exercise while wearing ankle weights. Begin with __________ lb weights. Then increase the weight by 1 lb (0.5 kg) increments. Do not wear ankle weights that are more than __________ lb. 4. Lie on your abdomen with your legs straight. 5. Bend your left / right knee as far as you can without feeling pain. Keep your hips flat against the floor. 6. Hold this position for __________ seconds. 7. Slowly lower your leg to the starting position. Repeat __________ times. Complete this exercise __________ times a day. Squats This exercise strengthens the muscles in front of your thigh and knee (quadriceps). 1. Stand in front of a table, with your feet and knees pointing straight ahead. You may rest your hands on the  table for balance but not for support. 2. Slowly bend your knees and lower your hips like you are going to sit in a chair. ? Keep your weight over your heels, not over your toes. ? Keep your lower legs upright so they are parallel with the table legs. ? Do not let your hips go lower than your knees. ? Do not bend lower than told by your health care provider. ? If your knee pain increases, do not bend as low. 3. Hold the squat position for __________ seconds. 4. Slowly push with your legs to return to standing. Do not use your hands to pull yourself to standing. Repeat __________ times. Complete this exercise __________ times a day. Wall slides This exercise strengthens the muscles in front of your thigh and knee (quadriceps). 1. Lean your back against a smooth wall or door, and walk your feet out 18-24 inches (46-61 cm) from it. 2. Place your feet hip-width apart. 3. Slowly slide down the wall or door until your knees bend __________ degrees. Keep your knees over your heels, not over your toes. Keep your knees in line with your hips. 4. Hold this position for __________ seconds. Repeat __________ times. Complete this exercise __________ times a day. Straight leg raises This exercise strengthens the muscles that rotate the leg at the hip and move it away from your body (hip abductors). 1. Lie on your side with your left / right leg in the top position. Lie so your head, shoulder, knee, and hip line up. You may bend your bottom knee to help you keep your balance. 2. Roll your hips slightly forward so your hips are stacked directly over each other and your left / right knee is facing forward. 3. Leading with your heel, lift your top leg 4-6 inches (10-15 cm). You should feel the muscles in your outer hip lifting. ? Do not let your foot drift forward. ? Do not let your knee roll toward the ceiling. 4. Hold this position for __________ seconds. 5. Slowly return your leg to the starting position.  6. Let your muscles relax completely after each repetition. Repeat __________ times. Complete this exercise __________ times a day. Straight leg raises   This exercise stretches the muscles that move your hips away from the front of the pelvis (hip extensors). 1. Lie on your abdomen on a firm surface. You can put a pillow under your hips if that is more comfortable. 2. Tense the muscles in your buttocks and lift your left / right leg about 4-6 inches (10-15 cm). Keep your knee straight as you lift your leg. 3. Hold this position for __________ seconds. 4. Slowly lower your leg to the starting position. 5. Let your leg relax completely after each repetition. Repeat __________ times. Complete this exercise __________ times a day. This information is not intended to replace advice given to you by your health care provider. Make sure you discuss any questions you have with your health care provider. Document Released: 02/15/2005 Document Revised: 01/22/2018 Document Reviewed: 01/22/2018 Elsevier Patient Education  2020 Elsevier Inc.  

## 2019-01-05 LAB — SEDIMENTATION RATE: Sed Rate: 6 mm/h (ref 0–30)

## 2019-01-05 LAB — URIC ACID: Uric Acid, Serum: 5.3 mg/dL (ref 2.5–7.0)

## 2019-01-05 LAB — RHEUMATOID FACTOR: Rhuematoid fact SerPl-aCnc: <14 IU/mL (ref <14)

## 2019-01-05 LAB — 14-3-3 ETA PROTEIN: 14-3-3 eta Protein: <0.2 ng/mL (ref <0.2)

## 2019-01-05 LAB — CYCLIC CITRUL PEPTIDE ANTIBODY, IGG: Cyclic Citrullin Peptide Ab: <16 UNITS

## 2019-01-07 NOTE — Progress Notes (Signed)
Office Visit Note  Patient: Nichole Fox             Date of Birth: 1960/09/28           MRN: [id number]             PCP: Forrest Moron, MD Referring: Forrest Moron, MD Visit Date: 01/21/2019 Occupation: '@GUAROCC'$ @  Subjective:  Pain in hands and knees  History of Present Illness: Nichole Fox is a 58 y.o. female with history of psoriasis and osteoarthritis.  She is just continues to have some discomfort in her hands and her knee joints.  She denies any joint swelling.  She states she has appointment coming up with the dermatologist.  She has not had any recurrence of hidradenitis.  Activities of Daily Living:  Patient reports morning stiffness for 2 minutes.   Patient Denies nocturnal pain.  Difficulty dressing/grooming: Denies Difficulty climbing stairs: Reports Difficulty getting out of chair: Denies Difficulty using hands for taps, buttons, cutlery, and/or writing: Denies  Review of Systems  Constitutional: Positive for fatigue. Negative for night sweats, weight gain and weight loss.  HENT: Negative for mouth sores, trouble swallowing, trouble swallowing, mouth dryness and nose dryness.   Eyes: Negative for pain, redness, visual disturbance and dryness.  Respiratory: Negative for cough, shortness of breath and difficulty breathing.   Cardiovascular: Negative for chest pain, palpitations, hypertension, irregular heartbeat and swelling in legs/feet.  Gastrointestinal: Negative for blood in stool, constipation and diarrhea.  Endocrine: Negative for increased urination.  Genitourinary: Negative for vaginal dryness.  Musculoskeletal: Positive for arthralgias, joint pain and morning stiffness. Negative for joint swelling, myalgias, muscle weakness, muscle tenderness and myalgias.  Skin: Positive for rash. Negative for color change, hair loss, skin tightness, ulcers and sensitivity to sunlight.  Allergic/Immunologic: Negative for susceptible to infections.   Neurological: Negative for dizziness, memory loss, night sweats and weakness.  Hematological: Negative for swollen glands.  Psychiatric/Behavioral: Positive for depressed mood and sleep disturbance. The patient is not nervous/anxious.     PMFS History:  Patient Active Problem List   Diagnosis Date Noted  . Dyslipidemia 11/19/2018  . Psoriatic arthritis ([place])   . Thyroid nodule 09/08/2015  . 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 Hidradenitis 09/08/2015  . S/P arthroscopic surgery of left knee 03/05/2015  . Bipolar affective disorder ([place]) 02/08/2015  . PTSD (post-traumatic stress disorder) 02/08/2015  . Lateral knee pain 02/08/2015    Past Medical History:  Diagnosis Date  . Allergy   . Arthritis   . Depression   . Psoriasis   . Psoriatic arthritis ([place])   . Sickle cell anemia ([facility])    trait    Family History  Problem Relation Age of Onset  . Diabetes Mother   . Depression Mother   . Mental illness Mother   . Thyroid disease Father   . Anemia Father    Past Surgical History:  Procedure Laterality Date  . abortions     x2  . COLONOSCOPY    . EYE SURGERY     lasik  . TONSILLECTOMY     Social History   Social History Narrative  . Not on file   Immunization History  Administered Date(s) Administered  . Tdap 02/24/2015     Objective: Vital Signs: BP 125/80 (BP Location: Left Arm, Patient Position: Sitting, Cuff Size: Large)   Pulse 93   Resp 14   Ht '5\' 4"'$  (1.626 m)   Wt 245 lb (111.1 kg)   BMI 42.05  kg/m    Physical Exam Vitals signs and nursing note reviewed.  Constitutional:      Appearance: She is well-developed.  HENT:     Head: Normocephalic and atraumatic.  Eyes:     Conjunctiva/sclera: Conjunctivae normal.  Neck:     Musculoskeletal: Normal range of motion.  Cardiovascular:     Rate and Rhythm: Normal rate and regular rhythm.     Heart sounds: Normal heart sounds.  Pulmonary:     Effort: Pulmonary effort is normal.     Breath  sounds: Normal breath sounds.  Abdominal:     General: Bowel sounds are normal.     Palpations: Abdomen is soft.  Lymphadenopathy:     Cervical: No cervical adenopathy.  Skin:    General: Skin is warm and dry.     Capillary Refill: Capillary refill takes less than 2 seconds.  Neurological:     Mental Status: She is alert and oriented to person, place, and time.  Psychiatric:        Behavior: Behavior normal.      Musculoskeletal Exam: C-spine, thoracic and lumbar spine were in good range of motion.  She had no SI joint tenderness.  Shoulder joints, elbow joints, wrist joints, MCPs PIPs DIPs with good range of motion with no synovitis.  Hip joints, knee joints, ankles, MTPs PIPs DIPs been good range of motion with no synovitis.  CDAI Exam: CDAI Score: - Patient Global: -; Provider Global: - Swollen: -; Tender: - Joint Exam   No joint exam has been documented for this visit   There is currently no information documented on the homunculus. Go to the Rheumatology activity and complete the homunculus joint exam.  Investigation: No additional findings.  Imaging: Xr Hand 2 View Left  Result Date: 01/01/2019 No PIP DIP, CMC, intercarpal radiocarpal joint space narrowing was noted.  No erosive changes were noted. Impression: Unremarkable x-ray of the hand.  Xr Hand 2 View Right  Result Date: 01/01/2019 No MCP, PIP or DIP joint space narrowing was noted.  No intercarpal radiocarpal joint space narrowing was noted.  No erosive changes were noted. Impression: X-ray was consistent with mild osteoarthritic changes.  Xr Knee 3 View Left  Result Date: 01/01/2019 Moderate medial compartment narrowing with medial osteophytes and intercondylar osteophytes was noted.  No chondrocalcinosis was noted.  Severe patellofemoral narrowing was noted. Impression: These findings are consistent with moderate osteoarthritis and severe chondromalacia patella.   Recent Labs: Lab Results  Component Value  Date   WBC 5.8 11/18/2018   HGB 12.2 11/18/2018   PLT 244 11/18/2018   NA 144 11/18/2018   K 3.9 11/18/2018   CL 105 11/18/2018   CO2 24 11/18/2018   GLUCOSE 88 11/18/2018   BUN 9 11/18/2018   CREATININE 0.58 11/18/2018   BILITOT 0.3 11/18/2018   ALKPHOS 110 11/18/2018   AST 33 11/18/2018   ALT 46 (H) 11/18/2018   PROT 6.7 11/18/2018   ALBUMIN 4.7 11/18/2018   CALCIUM 10.1 11/18/2018   GFRAA 119 11/18/2018  January 01, 2019 ESR 6, RF negative, anti-CCP negative, '14 3 3 '$ eta negative, uric acid 5.3  Speciality Comments: No specialty comments available.  Procedures:  No procedures performed Allergies: Erythromycin, Lithium, Shellfish allergy, and Strawberry extract   Assessment / Plan:     Visit Diagnoses: Pain in both hands - X-rays were consistent with osteoarthritis.  All autoimmune work-up was negative.  Patient was treated with Plaquenil in the past for psoriatic arthritis in [place].  Patient has no synovitis on examination.  I have advised her to contact me in case she develops any increased pain or swelling.  I also discussed possible use of ultrasound to look for synovitis.  Joint protection muscle strengthening was discussed.  A handout on exercises was given.  Primary osteoarthritis of both knees - Bilateral moderate osteoarthritis and severe chondromalacia patella.  Weight loss diet and exercise was discussed.  A handout on exercises was given.  We also discussed in the future she may benefit from cortisone injection if she has ongoing discomfort.  Visco supplement injections but also could be beneficial.  Psoriasis - She was diagnosed in her 37s and was treated with topical agents.  We referred her to dermatology.  Her appointment is pending at this time.  Hidradenitis-patient had no recurrence.  Vesicular palmoplantar eczema  S/P arthroscopic surgery of left knee  Bipolar affective disorder in remission (HCC)  PTSD (post-traumatic stress disorder)   Dyslipidemia  Thyroid nodule  Orders: No orders of the defined types were placed in this encounter.  No orders of the defined types were placed in this encounter.   Face-to-face time spent with patient was  minutes. Greater than 50% of time was spent in counseling and coordination of care.  Follow-Up Instructions: Return in 3 months (on 04/23/2019) for Osteoarthritis.   Bo Merino, MD  Note - This record has been created using Editor, commissioning.  Chart creation errors have been sought, but may not always  have been located. Such creation errors do not reflect on  the standard of medical care.

## 2019-01-21 ENCOUNTER — Other Ambulatory Visit: Payer: Self-pay

## 2019-01-21 ENCOUNTER — Encounter: Payer: Self-pay | Admitting: Rheumatology

## 2019-01-21 ENCOUNTER — Ambulatory Visit (INDEPENDENT_AMBULATORY_CARE_PROVIDER_SITE_OTHER): Payer: Medicare Other | Admitting: Rheumatology

## 2019-01-21 VITALS — BP 125/80 | HR 93 | Resp 14 | Ht 64.0 in | Wt 245.0 lb

## 2019-01-21 DIAGNOSIS — M79641 Pain in right hand: Secondary | ICD-10-CM | POA: Diagnosis not present

## 2019-01-21 DIAGNOSIS — E785 Hyperlipidemia, unspecified: Secondary | ICD-10-CM

## 2019-01-21 DIAGNOSIS — M79642 Pain in left hand: Secondary | ICD-10-CM

## 2019-01-21 DIAGNOSIS — L301 Dyshidrosis [pompholyx]: Secondary | ICD-10-CM

## 2019-01-21 DIAGNOSIS — L732 Hidradenitis suppurativa: Secondary | ICD-10-CM

## 2019-01-21 DIAGNOSIS — L409 Psoriasis, unspecified: Secondary | ICD-10-CM | POA: Diagnosis not present

## 2019-01-21 DIAGNOSIS — F3170 Bipolar disorder, currently in remission, most recent episode unspecified: Secondary | ICD-10-CM

## 2019-01-21 DIAGNOSIS — M170 Bilateral primary osteoarthritis of knee: Secondary | ICD-10-CM | POA: Diagnosis not present

## 2019-01-21 DIAGNOSIS — F4310 Post-traumatic stress disorder, unspecified: Secondary | ICD-10-CM

## 2019-01-21 DIAGNOSIS — Z9889 Other specified postprocedural states: Secondary | ICD-10-CM

## 2019-01-21 DIAGNOSIS — E041 Nontoxic single thyroid nodule: Secondary | ICD-10-CM

## 2019-01-21 NOTE — Patient Instructions (Addendum)
Journal for Nurse Practitioners, 15(4), 263-267. Retrieved January 21, 2018 from [email or web address]">  Knee Exercises Ask your health care provider which exercises are safe for you. Do exercises exactly as told by your health care provider and adjust them as directed. It is normal to feel mild stretching, pulling, tightness, or discomfort as you do these exercises. Stop right away if you feel sudden pain or your pain gets worse. Do not begin these exercises until told by your health care provider. Stretching and range-of-motion exercises These exercises warm up your muscles and joints and improve the movement and flexibility of your knee. These exercises also help to relieve pain and swelling. Knee extension, prone 1. Lie on your abdomen (prone position) on a bed. 2. Place your left / right knee just beyond the edge of the surface so your knee is not on the bed. You can put a towel under your left / right thigh just above your kneecap for comfort. 3. Relax your leg muscles and allow gravity to straighten your knee (extension). You should feel a stretch behind your left / right knee. 4. Hold this position for __________ seconds. 5. Scoot up so your knee is supported between repetitions. Repeat __________ times. Complete this exercise __________ times a day. Knee flexion, active  1. Lie on your back with both legs straight. If this causes back discomfort, bend your left / right knee so your foot is flat on the floor. 2. Slowly slide your left / right heel back toward your buttocks. Stop when you feel a gentle stretch in the front of your knee or thigh (flexion). 3. Hold this position for __________ seconds. 4. Slowly slide your left / right heel back to the starting position. Repeat __________ times. Complete this exercise __________ times a day. Quadriceps stretch, prone  1. Lie on your abdomen on a firm surface, such as a bed or padded floor. 2. Bend your left / right knee and hold  your ankle. If you cannot reach your ankle or pant leg, loop a belt around your foot and grab the belt instead. 3. Gently pull your heel toward your buttocks. Your knee should not slide out to the side. You should feel a stretch in the front of your thigh and knee (quadriceps). 4. Hold this position for __________ seconds. Repeat __________ times. Complete this exercise __________ times a day. Hamstring, supine 1. Lie on your back (supine position). 2. Loop a belt or towel over the ball of your left / right foot. The ball of your foot is on the walking surface, right under your toes. 3. Straighten your left / right knee and slowly pull on the belt to raise your leg until you feel a gentle stretch behind your knee (hamstring). ? Do not let your knee bend while you do this. ? Keep your other leg flat on the floor. 4. Hold this position for __________ seconds. Repeat __________ times. Complete this exercise __________ times a day. Strengthening exercises These exercises build strength and endurance in your knee. Endurance is the ability to use your muscles for a long time, even after they get tired. Quadriceps, isometric This exercise stretches the muscles in front of your thigh (quadriceps) without moving your knee joint (isometric). 1. Lie on your back with your left / right leg extended and your other knee bent. Put a rolled towel or small pillow under your knee if told by your health care provider. 2. Slowly tense the muscles in the front of your left /   right thigh. You should see your kneecap slide up toward your hip or see increased dimpling just above the knee. This motion will push the back of the knee toward the floor. 3. For __________ seconds, hold the muscle as tight as you can without increasing your pain. 4. Relax the muscles slowly and completely. Repeat __________ times. Complete this exercise __________ times a day. Straight leg raises This exercise stretches the muscles in front  of your thigh (quadriceps) and the muscles that move your hips (hip flexors). 1. Lie on your back with your left / right leg extended and your other knee bent. 2. Tense the muscles in the front of your left / right thigh. You should see your kneecap slide up or see increased dimpling just above the knee. Your thigh may even shake a bit. 3. Keep these muscles tight as you raise your leg 4-6 inches (10-15 cm) off the floor. Do not let your knee bend. 4. Hold this position for __________ seconds. 5. Keep these muscles tense as you lower your leg. 6. Relax your muscles slowly and completely after each repetition. Repeat __________ times. Complete this exercise __________ times a day. Hamstring, isometric 1. Lie on your back on a firm surface. 2. Bend your left / right knee about __________ degrees. 3. Dig your left / right heel into the surface as if you are trying to pull it toward your buttocks. Tighten the muscles in the back of your thighs (hamstring) to "dig" as hard as you can without increasing any pain. 4. Hold this position for __________ seconds. 5. Release the tension gradually and allow your muscles to relax completely for __________ seconds after each repetition. Repeat __________ times. Complete this exercise __________ times a day. Hamstring curls If told by your health care provider, do this exercise while wearing ankle weights. Begin with __________ lb weights. Then increase the weight by 1 lb (0.5 kg) increments. Do not wear ankle weights that are more than __________ lb. 1. Lie on your abdomen with your legs straight. 2. Bend your left / right knee as far as you can without feeling pain. Keep your hips flat against the floor. 3. Hold this position for __________ seconds. 4. Slowly lower your leg to the starting position. Repeat __________ times. Complete this exercise __________ times a day. Squats This exercise strengthens the muscles in front of your thigh and knee  (quadriceps). 1. Stand in front of a table, with your feet and knees pointing straight ahead. You may rest your hands on the table for balance but not for support. 2. Slowly bend your knees and lower your hips like you are going to sit in a chair. ? Keep your weight over your heels, not over your toes. ? Keep your lower legs upright so they are parallel with the table legs. ? Do not let your hips go lower than your knees. ? Do not bend lower than told by your health care provider. ? If your knee pain increases, do not bend as low. 3. Hold the squat position for __________ seconds. 4. Slowly push with your legs to return to standing. Do not use your hands to pull yourself to standing. Repeat __________ times. Complete this exercise __________ times a day. Wall slides This exercise strengthens the muscles in front of your thigh and knee (quadriceps). 1. Lean your back against a smooth wall or door, and walk your feet out 18-24 inches (46-61 cm) from it. 2. Place your feet hip-width apart. 3.   Slowly slide down the wall or door until your knees bend __________ degrees. Keep your knees over your heels, not over your toes. Keep your knees in line with your hips. 4. Hold this position for __________ seconds. Repeat __________ times. Complete this exercise __________ times a day. Straight leg raises This exercise strengthens the muscles that rotate the leg at the hip and move it away from your body (hip abductors). 1. Lie on your side with your left / right leg in the top position. Lie so your head, shoulder, knee, and hip line up. You may bend your bottom knee to help you keep your balance. 2. Roll your hips slightly forward so your hips are stacked directly over each other and your left / right knee is facing forward. 3. Leading with your heel, lift your top leg 4-6 inches (10-15 cm). You should feel the muscles in your outer hip lifting. ? Do not let your foot drift forward. ? Do not let your knee  roll toward the ceiling. 4. Hold this position for __________ seconds. 5. Slowly return your leg to the starting position. 6. Let your muscles relax completely after each repetition. Repeat __________ times. Complete this exercise __________ times a day. Straight leg raises This exercise stretches the muscles that move your hips away from the front of the pelvis (hip extensors). 1. Lie on your abdomen on a firm surface. You can put a pillow under your hips if that is more comfortable. 2. Tense the muscles in your buttocks and lift your left / right leg about 4-6 inches (10-15 cm). Keep your knee straight as you lift your leg. 3. Hold this position for __________ seconds. 4. Slowly lower your leg to the starting position. 5. Let your leg relax completely after each repetition. Repeat __________ times. Complete this exercise __________ times a day. This information is not intended to replace advice given to you by your health care provider. Make sure you discuss any questions you have with your health care provider. Document Released: 02/15/2005 Document Revised: 01/22/2018 Document Reviewed: 01/22/2018 [employer] Patient Education  2020 [employer] Hand Exercises Hand exercises can be helpful for almost anyone. These exercises can strengthen the hands, improve flexibility and movement, and increase blood flow to the hands. These results can make work and daily tasks easier. Hand exercises can be especially helpful for people who have joint pain from arthritis or have nerve damage from overuse (carpal tunnel syndrome). These exercises can also help people who have injured a hand. Exercises Most of these hand exercises are gentle stretching and motion exercises. It is usually safe to do them often throughout the day. Warming up your hands before exercise may help to reduce stiffness. You can do this with gentle massage or by placing your hands in warm water for 10-15 minutes. It is normal to feel  some stretching, pulling, tightness, or mild discomfort as you begin new exercises. This will gradually improve. Stop an exercise right away if you feel sudden, severe pain or your pain gets worse. Ask your health care provider which exercises are best for you. Knuckle bend or "claw" fist 6. Stand or sit with your arm, hand, and all five fingers pointed straight up. Make sure to keep your wrist straight during the exercise. 7. Gently bend your fingers down toward your palm until the tips of your fingers are touching the top of your palm. Keep your big knuckle straight and just bend the small knuckles in your fingers. 8. Hold   this position for __________ seconds. 9. Straighten (extend) your fingers back to the starting position. Repeat this exercise 5-10 times with each hand. Full finger fist 5. Stand or sit with your arm, hand, and all five fingers pointed straight up. Make sure to keep your wrist straight during the exercise. 6. Gently bend your fingers into your palm until the tips of your fingers are touching the middle of your palm. 7. Hold this position for __________ seconds. 8. Extend your fingers back to the starting position, stretching every joint fully. Repeat this exercise 5-10 times with each hand. Straight fist 5. Stand or sit with your arm, hand, and all five fingers pointed straight up. Make sure to keep your wrist straight during the exercise. 6. Gently bend your fingers at the big knuckle, where your fingers meet your hand, and the middle knuckle. Keep the knuckle at the tips of your fingers straight and try to touch the bottom of your palm. 7. Hold this position for __________ seconds. 8. Extend your fingers back to the starting position, stretching every joint fully. Repeat this exercise 5-10 times with each hand. Tabletop 1. Stand or sit with your arm, hand, and all five fingers pointed straight up. Make sure to keep your wrist straight during the exercise. 2. Gently bend  your fingers at the big knuckle, where your fingers meet your hand, as far down as you can while keeping the small knuckles in your fingers straight. Think of forming a tabletop with your fingers. 3. Hold this position for __________ seconds. 4. Extend your fingers back to the starting position, stretching every joint fully. Repeat this exercise 5-10 times with each hand. Finger spread 5. Place your hand flat on a table with your palm facing down. Make sure your wrist stays straight as you do this exercise. 6. Spread your fingers and thumb apart from each other as far as you can until you feel a gentle stretch. Hold this position for __________ seconds. 7. Bring your fingers and thumb tight together again. Hold this position for __________ seconds. Repeat this exercise 5-10 times with each hand. Making circles 7. Stand or sit with your arm, hand, and all five fingers pointed straight up. Make sure to keep your wrist straight during the exercise. 8. Make a circle by touching the tip of your thumb to the tip of your index finger. 9. Hold for __________ seconds. Then open your hand wide. 10. Repeat this motion with your thumb and each finger on your hand. Repeat this exercise 5-10 times with each hand. Thumb motion 6. Sit with your forearm resting on a table and your wrist straight. Your thumb should be facing up toward the ceiling. Keep your fingers relaxed as you move your thumb. 7. Lift your thumb up as high as you can toward the ceiling. Hold for __________ seconds. 8. Bend your thumb across your palm as far as you can, reaching the tip of your thumb for the small finger (pinkie) side of your palm. Hold for __________ seconds. Repeat this exercise 5-10 times with each hand. Grip strengthening  5. Hold a stress ball or other soft ball in the middle of your hand. 6. Slowly increase the pressure, squeezing the ball as much as you can without causing pain. Think of bringing the tips of your  fingers into the middle of your palm. All of your finger joints should bend when doing this exercise. 7. Hold your squeeze for __________ seconds, then relax. Repeat this exercise 5-10   times with each hand. Contact a health care provider if:  Your hand pain or discomfort gets much worse when you do an exercise.  Your hand pain or discomfort does not improve within 2 hours after you exercise. If you have any of these problems, stop doing these exercises right away. Do not do them again unless your health care provider says that you can. Get help right away if:  You develop sudden, severe hand pain or swelling. If this happens, stop doing these exercises right away. Do not do them again unless your health care provider says that you can. This information is not intended to replace advice given to you by your health care provider. Make sure you discuss any questions you have with your health care provider. Document Released: 03/15/2015 Document Revised: 07/25/2018 Document Reviewed: 04/04/2018 [employer] Patient Education  2020 [employer].  Journal for Nurse Practitioners, 15(4), [phone]. Retrieved January 21, 2018 from [email or web address]">  Knee Exercises Ask your health care provider which exercises are safe for you. Do exercises exactly as told by your health care provider and adjust them as directed. It is normal to feel mild stretching, pulling, tightness, or discomfort as you do these exercises. Stop right away if you feel sudden pain or your pain gets worse. Do not begin these exercises until told by your health care provider. Stretching and range-of-motion exercises These exercises warm up your muscles and joints and improve the movement and flexibility of your knee. These exercises also help to relieve pain and swelling. Knee extension, prone 10. Lie on your abdomen (prone position) on a bed. 11. Place your left / right knee just beyond the edge of the surface so your knee is  not on the bed. You can put a towel under your left / right thigh just above your kneecap for comfort. 12. Relax your leg muscles and allow gravity to straighten your knee (extension). You should feel a stretch behind your left / right knee. 13. Hold this position for __________ seconds. 14. Scoot up so your knee is supported between repetitions. Repeat __________ times. Complete this exercise __________ times a day. Knee flexion, active  9. Lie on your back with both legs straight. If this causes back discomfort, bend your left / right knee so your foot is flat on the floor. 10. Slowly slide your left / right heel back toward your buttocks. Stop when you feel a gentle stretch in the front of your knee or thigh (flexion). 11. Hold this position for __________ seconds. 12. Slowly slide your left / right heel back to the starting position. Repeat __________ times. Complete this exercise __________ times a day. Quadriceps stretch, prone  9. Lie on your abdomen on a firm surface, such as a bed or padded floor. 10. Bend your left / right knee and hold your ankle. If you cannot reach your ankle or pant leg, loop a belt around your foot and grab the belt instead. 11. Gently pull your heel toward your buttocks. Your knee should not slide out to the side. You should feel a stretch in the front of your thigh and knee (quadriceps). 12. Hold this position for __________ seconds. Repeat __________ times. Complete this exercise __________ times a day. Hamstring, supine 5. Lie on your back (supine position). 6. Loop a belt or towel over the ball of your left / right foot. The ball of your foot is on the walking surface, right under your toes. 7. Straighten your left / right knee and  slowly pull on the belt to raise your leg until you feel a gentle stretch behind your knee (hamstring). ? Do not let your knee bend while you do this. ? Keep your other leg flat on the floor. 8. Hold this position for  __________ seconds. Repeat __________ times. Complete this exercise __________ times a day. Strengthening exercises These exercises build strength and endurance in your knee. Endurance is the ability to use your muscles for a long time, even after they get tired. Quadriceps, isometric This exercise stretches the muscles in front of your thigh (quadriceps) without moving your knee joint (isometric). 8. Lie on your back with your left / right leg extended and your other knee bent. Put a rolled towel or small pillow under your knee if told by your health care provider. 9. Slowly tense the muscles in the front of your left / right thigh. You should see your kneecap slide up toward your hip or see increased dimpling just above the knee. This motion will push the back of the knee toward the floor. 10. For __________ seconds, hold the muscle as tight as you can without increasing your pain. 11. Relax the muscles slowly and completely. Repeat __________ times. Complete this exercise __________ times a day. Straight leg raises This exercise stretches the muscles in front of your thigh (quadriceps) and the muscles that move your hips (hip flexors). 81. Lie on your back with your left / right leg extended and your other knee bent. 12. Tense the muscles in the front of your left / right thigh. You should see your kneecap slide up or see increased dimpling just above the knee. Your thigh may even shake a bit. 13. Keep these muscles tight as you raise your leg 4-6 inches (10-15 cm) off the floor. Do not let your knee bend. 14. Hold this position for __________ seconds. 15. Keep these muscles tense as you lower your leg. 16. Relax your muscles slowly and completely after each repetition. Repeat __________ times. Complete this exercise __________ times a day. Hamstring, isometric 9. Lie on your back on a firm surface. Keyes your left / right knee about __________ degrees. 11. Dig your left / right heel  into the surface as if you are trying to pull it toward your buttocks. Tighten the muscles in the back of your thighs (hamstring) to "dig" as hard as you can without increasing any pain. 12. Hold this position for __________ seconds. 13. Release the tension gradually and allow your muscles to relax completely for __________ seconds after each repetition. Repeat __________ times. Complete this exercise __________ times a day. Hamstring curls If told by your health care provider, do this exercise while wearing ankle weights. Begin with __________ lb weights. Then increase the weight by 1 lb (0.5 kg) increments. Do not wear ankle weights that are more than __________ lb. 8. Lie on your abdomen with your legs straight. Portis your left / right knee as far as you can without feeling pain. Keep your hips flat against the floor. 10. Hold this position for __________ seconds. 11. Slowly lower your leg to the starting position. Repeat __________ times. Complete this exercise __________ times a day. Squats This exercise strengthens the muscles in front of your thigh and knee (quadriceps). 5. Stand in front of a table, with your feet and knees pointing straight ahead. You may rest your hands on the table for balance but not for support. 6. Slowly bend your knees and lower your hips  like you are going to sit in a chair. ? Keep your weight over your heels, not over your toes. ? Keep your lower legs upright so they are parallel with the table legs. ? Do not let your hips go lower than your knees. ? Do not bend lower than told by your health care provider. ? If your knee pain increases, do not bend as low. 7. Hold the squat position for __________ seconds. 8. Slowly push with your legs to return to standing. Do not use your hands to pull yourself to standing. Repeat __________ times. Complete this exercise __________ times a day. Wall slides This exercise strengthens the muscles in front of your thigh and  knee (quadriceps). 5. Lean your back against a smooth wall or door, and walk your feet out 18-24 inches (46-61 cm) from it. 6. Place your feet hip-width apart. 7. Slowly slide down the wall or door until your knees bend __________ degrees. Keep your knees over your heels, not over your toes. Keep your knees in line with your hips. 8. Hold this position for __________ seconds. Repeat __________ times. Complete this exercise __________ times a day. Straight leg raises This exercise strengthens the muscles that rotate the leg at the hip and move it away from your body (hip abductors). 7. Lie on your side with your left / right leg in the top position. Lie so your head, shoulder, knee, and hip line up. You may bend your bottom knee to help you keep your balance. 8. Roll your hips slightly forward so your hips are stacked directly over each other and your left / right knee is facing forward. 9. Leading with your heel, lift your top leg 4-6 inches (10-15 cm). You should feel the muscles in your outer hip lifting. ? Do not let your foot drift forward. ? Do not let your knee roll toward the ceiling. 10. Hold this position for __________ seconds. 11. Slowly return your leg to the starting position. 12. Let your muscles relax completely after each repetition. Repeat __________ times. Complete this exercise __________ times a day. Straight leg raises This exercise stretches the muscles that move your hips away from the front of the pelvis (hip extensors). 6. Lie on your abdomen on a firm surface. You can put a pillow under your hips if that is more comfortable. 7. Tense the muscles in your buttocks and lift your left / right leg about 4-6 inches (10-15 cm). Keep your knee straight as you lift your leg. 8. Hold this position for __________ seconds. 9. Slowly lower your leg to the starting position. 10. Let your leg relax completely after each repetition. Repeat __________ times. Complete this exercise  __________ times a day. This information is not intended to replace advice given to you by your health care provider. Make sure you discuss any questions you have with your health care provider. Document Released: 02/15/2005 Document Revised: 01/22/2018 Document Reviewed: 01/22/2018 Else[employer] Patient Education  2020 [employer]  Hand Exercises Hand exercises can be helpful for almost anyone. These exercises can strengthen the hands, improve flexibility and movement, and increase blood flow to the hands. These results can make work and daily tasks easier. Hand exercises can be especially helpful for people who have joint pain from arthritis or have nerve damage from overuse (carpal tunnel syndrome). These exercises can also help people who have injured a hand. Exercises Most of these hand exercises are gentle stretching and motion exercises. It is usually safe to do  them often throughout the day. Warming up your hands before exercise may help to reduce stiffness. You can do this with gentle massage or by placing your hands in warm water for 10-15 minutes. It is normal to feel some stretching, pulling, tightness, or mild discomfort as you begin new exercises. This will gradually improve. Stop an exercise right away if you feel sudden, severe pain or your pain gets worse. Ask your health care provider which exercises are best for you. Knuckle bend or "claw" fist 15. Stand or sit with your arm, hand, and all five fingers pointed straight up. Make sure to keep your wrist straight during the exercise. 16. Gently bend your fingers down toward your palm until the tips of your fingers are touching the top of your palm. Keep your big knuckle straight and just bend the small knuckles in your fingers. 17. Hold this position for __________ seconds. 18. Straighten (extend) your fingers back to the starting position. Repeat this exercise 5-10 times with each hand. Full finger fist 13. Stand or sit with your arm,  hand, and all five fingers pointed straight up. Make sure to keep your wrist straight during the exercise. 14. Gently bend your fingers into your palm until the tips of your fingers are touching the middle of your palm. 15. Hold this position for __________ seconds. 16. Extend your fingers back to the starting position, stretching every joint fully. Repeat this exercise 5-10 times with each hand. Straight fist 13. Stand or sit with your arm, hand, and all five fingers pointed straight up. Make sure to keep your wrist straight during the exercise. 14. Gently bend your fingers at the big knuckle, where your fingers meet your hand, and the middle knuckle. Keep the knuckle at the tips of your fingers straight and try to touch the bottom of your palm. 15. Hold this position for __________ seconds. 16. Extend your fingers back to the starting position, stretching every joint fully. Repeat this exercise 5-10 times with each hand. Tabletop 5. Stand or sit with your arm, hand, and all five fingers pointed straight up. Make sure to keep your wrist straight during the exercise. 6. Gently bend your fingers at the big knuckle, where your fingers meet your hand, as far down as you can while keeping the small knuckles in your fingers straight. Think of forming a tabletop with your fingers. 7. Hold this position for __________ seconds. 8. Extend your fingers back to the starting position, stretching every joint fully. Repeat this exercise 5-10 times with each hand. Finger spread 12. Place your hand flat on a table with your palm facing down. Make sure your wrist stays straight as you do this exercise. 13. Spread your fingers and thumb apart from each other as far as you can until you feel a gentle stretch. Hold this position for __________ seconds. 14. Bring your fingers and thumb tight together again. Hold this position for __________ seconds. Repeat this exercise 5-10 times with each hand. Making circles  17. Stand or sit with your arm, hand, and all five fingers pointed straight up. Make sure to keep your wrist straight during the exercise. 18. Make a circle by touching the tip of your thumb to the tip of your index finger. 19. Hold for __________ seconds. Then open your hand wide. 20. Repeat this motion with your thumb and each finger on your hand. Repeat this exercise 5-10 times with each hand. Thumb motion 14. Sit with your forearm resting on a table  and your wrist straight. Your thumb should be facing up toward the ceiling. Keep your fingers relaxed as you move your thumb. 15. Lift your thumb up as high as you can toward the ceiling. Hold for __________ seconds. 16. Bend your thumb across your palm as far as you can, reaching the tip of your thumb for the small finger (pinkie) side of your palm. Hold for __________ seconds. Repeat this exercise 5-10 times with each hand. Grip strengthening  12. Hold a stress ball or other soft ball in the middle of your hand. 13. Slowly increase the pressure, squeezing the ball as much as you can without causing pain. Think of bringing the tips of your fingers into the middle of your palm. All of your finger joints should bend when doing this exercise. 14. Hold your squeeze for __________ seconds, then relax. Repeat this exercise 5-10 times with each hand. Contact a health care provider if:  Your hand pain or discomfort gets much worse when you do an exercise.  Your hand pain or discomfort does not improve within 2 hours after you exercise. If you have any of these problems, stop doing these exercises right away. Do not do them again unless your health care provider says that you can. Get help right away if:  You develop sudden, severe hand pain or swelling. If this happens, stop doing these exercises right away. Do not do them again unless your health care provider says that you can. This information is not intended to replace advice given to you by  your health care provider. Make sure you discuss any questions you have with your health care provider. Document Released: 03/15/2015 Document Revised: 07/25/2018 Document Reviewed: 04/04/2018 Elsevier Patient Education  2020 [employer].

## 2019-02-18 ENCOUNTER — Encounter: Payer: Medicare Other | Admitting: Family Medicine

## 2019-02-21 ENCOUNTER — Other Ambulatory Visit: Payer: Self-pay

## 2019-02-21 ENCOUNTER — Ambulatory Visit (INDEPENDENT_AMBULATORY_CARE_PROVIDER_SITE_OTHER): Payer: Medicare Other | Admitting: Family Medicine

## 2019-02-21 ENCOUNTER — Encounter: Payer: Self-pay | Admitting: Family Medicine

## 2019-02-21 ENCOUNTER — Other Ambulatory Visit (HOSPITAL_COMMUNITY)
Admission: RE | Admit: 2019-02-21 | Discharge: 2019-02-21 | Disposition: A | Discharge status: Home / Self Care | Payer: Medicare Other | Source: Ambulatory Visit | Attending: Family Medicine | Admitting: Family Medicine

## 2019-02-21 ENCOUNTER — Encounter: Payer: Self-pay | Admitting: Gastroenterology

## 2019-02-21 VITALS — BP 115/76 | HR 89 | Temp 98.7°F | Resp 12 | Ht 65.0 in | Wt 242.0 lb

## 2019-02-21 DIAGNOSIS — Z0000 Encounter for general adult medical examination without abnormal findings: Secondary | ICD-10-CM

## 2019-02-21 DIAGNOSIS — Z0001 Encounter for general adult medical examination with abnormal findings: Secondary | ICD-10-CM

## 2019-02-21 DIAGNOSIS — E785 Hyperlipidemia, unspecified: Secondary | ICD-10-CM | POA: Diagnosis not present

## 2019-02-21 DIAGNOSIS — Z113 Encounter for screening for infections with a predominantly sexual mode of transmission: Secondary | ICD-10-CM

## 2019-02-21 DIAGNOSIS — Z1231 Encounter for screening mammogram for malignant neoplasm of breast: Secondary | ICD-10-CM

## 2019-02-21 DIAGNOSIS — Z1211 Encounter for screening for malignant neoplasm of colon: Secondary | ICD-10-CM

## 2019-02-21 DIAGNOSIS — E041 Nontoxic single thyroid nodule: Secondary | ICD-10-CM | POA: Diagnosis not present

## 2019-02-21 DIAGNOSIS — Z124 Encounter for screening for malignant neoplasm of cervix: Secondary | ICD-10-CM | POA: Diagnosis not present

## 2019-02-21 DIAGNOSIS — Z1151 Encounter for screening for human papillomavirus (HPV): Secondary | ICD-10-CM | POA: Diagnosis not present

## 2019-02-21 DIAGNOSIS — R7301 Impaired fasting glucose: Secondary | ICD-10-CM

## 2019-02-21 DIAGNOSIS — B373 Candidiasis of vulva and vagina: Secondary | ICD-10-CM | POA: Insufficient documentation

## 2019-02-21 DIAGNOSIS — Z1159 Encounter for screening for other viral diseases: Secondary | ICD-10-CM

## 2019-02-21 MED ORDER — NYSTATIN 100000 UNIT/GM EX POWD: Freq: Two times a day (BID) | CUTANEOUS | 1 refills | Status: AC

## 2019-02-21 NOTE — Progress Notes (Signed)
Chief Complaint  Patient presents with  . Annual Exam    Subjective:  Nichole Fox is a 58 y.o. female here for a medicare wellness visit.  Patient is established pt  Colon Cancer Screening She had a colonoscopy more than 10 years ago without any polyps She denies blood in her stool, unexpected weight loss or pain with defecation No rectal itching She does not smoke She does not have a family history of colon cancer    Patient Active Problem List   Diagnosis Date Noted  . Dyslipidemia 11/19/2018  . Psoriatic arthritis ([place])   . Thyroid nodule 09/08/2015  . 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 Hidradenitis 09/08/2015  . S/P arthroscopic surgery of left knee 03/05/2015  . Bipolar affective disorder ([place] [address]) 02/08/2015  . PTSD (post-traumatic stress disorder) 02/08/2015  . Lateral knee pain 02/08/2015    Past Medical History:  Diagnosis Date  . Allergy   . Arthritis   . Bipolar disorder ([place])   . Depression   . Goiter    on thyrodi, MD just watching  . Hyperlipidemia    diet controlled no meds  . Pre-diabetes    metformin, ozempic  . Psoriasis   . Psoriasis   . Psoriatic arthritis ([place])   . PTSD (post-traumatic stress disorder)   . Sickle cell trait [facility])     Past Surgical History:  Procedure Laterality Date  . abortions     x2  . COLONOSCOPY     greater 10 yrs ago no polyps  . EYE SURGERY     lasik  . KNEE ARTHROSCOPY Left   . TONSILLECTOMY    . WISDOM TOOTH EXTRACTION       Outpatient Medications Prior to Visit  Medication Sig Dispense Refill  . betamethasone dipropionate (DIPROLENE) 0.05 % ointment Apply topically 2 (two) times daily. 45 g 6  . clobetasol ointment (TEMOVATE) 5.78 % Apply 1 application topically 2 (two) times daily. 30 g 11  . fluticasone (VERAMYST) 27.5 MCG/SPRAY nasal spray Place 2 sprays into the nose daily. (Patient not taking: Reported on 03/06/2019) 10 g 11  . ARIPiprazole (ABILIFY) 10 MG tablet Take 1 tablet (10 mg  total) by mouth daily. 90 tablet 0  . metFORMIN (GLUCOPHAGE-XR) 500 MG 24 hr tablet Take 2 tablets (1,000 mg total) by mouth 2 (two) times daily with breakfast and lunch. 180 tablet 3  . venlafaxine XR (EFFEXOR-XR) 150 MG 24 hr capsule Take 1 capsule (150 mg total) by mouth daily with breakfast. 90 capsule 1  . zolpidem (AMBIEN) 10 MG tablet Take 1 tablet (10 mg total) by mouth at bedtime as needed for sleep. 30 tablet 3   No facility-administered medications prior to visit.     Allergies  Allergen Reactions  . Erythromycin Nausea Only, Other (See Comments) and Nausea And Vomiting    Upset stomach  . Lithium Rash    Exacerbates eczema and psoriasis  . Shellfish Allergy Swelling, Rash and Nausea And Vomiting  . Strawberry Extract      Family History  Problem Relation Age of Onset  . Diabetes Mother   . Depression Mother   . Mental illness Mother   . Thyroid disease Father   . Anemia Father   . Colon cancer Neg Hx   . Stomach cancer Neg Hx   . Rectal cancer Neg Hx      Health Habits: Dental Exam: up to date Eye Exam: up to date Exercise: 0 times/week on average Current exercise activities: none  Diet: balanced  Social History   Socioeconomic History  . Marital status: Single    Spouse name: Not on file  . Number of children: Not on file  . Years of education: Not on file  . Highest education level: Not on file  Occupational History    Comment: on disability due to mood disorder  Social Needs  . Financial resource strain: Not hard at all  . Food insecurity    Worry: Never true    Inability: Never true  . Transportation needs    Medical: No    Non-medical: No  Tobacco Use  . Smoking status: Never Smoker  . Smokeless tobacco: Never Used  Substance and Sexual Activity  . Alcohol use: Yes    Comment: socially  . Drug use: Never  . Sexual activity: Not Currently    Birth control/protection: Post-menopausal  Lifestyle  . Physical activity    Days per week: 0  days    Minutes per session: 0 min  . Stress: To some extent  Relationships  . Social connections    Talks on phone: More than three times a week    Gets together: More than three times a week    Attends religious service: More than 4 times per year    Active member of club or organization: Yes    Attends meetings of clubs or organizations: More than 4 times per year    Relationship status: Divorced  . Intimate partner violence    Fear of current or ex partner: No    Emotionally abused: No    Physically abused: No    Forced sexual activity: No  Other Topics Concern  . Not on file  Social History Narrative  . Not on file   Social History   Substance and Sexual Activity  Alcohol Use Yes   Comment: socially   Social History   Tobacco Use  Smoking Status Never Smoker  Smokeless Tobacco Never Used   Social History   Substance and Sexual Activity  Drug Use Never    GYN: Sexual Health Menstrual status: regular menses LMP: No LMP recorded. Patient is postmenopausal. Last pap smear: see HM section History of abnormal pap smears:  Sexually active: with  partner Current contraception: none  Health Maintenance: See under health Maintenance activity for review of completion dates as well. Immunization History  Administered Date(s) Administered  . Tdap 02/24/2015    Depression Screen-PHQ2/9 Depression screen [facility] 2/9 02/21/2019 11/18/2018  Decreased Interest 3 0  Down, Depressed, Hopeless 1 0  PHQ - 2 Score 4 0  Altered sleeping 3 -  Tired, decreased energy 1 -  Change in appetite 3 -  Feeling bad or failure about yourself  0 -  Trouble concentrating 0 -  Moving slowly or fidgety/restless 3 -  Suicidal thoughts 1 -  PHQ-9 Score 15 -  Difficult doing work/chores Somewhat difficult -    Depression Severity and Treatment Recommendations:  0-4= None  5-9= Mild / Treatment: Support, educate to call if worse; return in one month  10-14= Moderate / Treatment: Support,  watchful waiting; Antidepressant or Psycotherapy  15-19= Moderately severe / Treatment: Antidepressant OR Psychotherapy  >= 20 = Major depression, severe / Antidepressant AND Psychotherapy  Functional Status Survey: Is the patient deaf or have difficulty hearing?: No Does the patient have difficulty seeing, even when wearing glasses/contacts?: No Does the patient have difficulty concentrating, remembering, or making decisions?: No Does the patient have difficulty walking or climbing stairs?:  No Does the patient have difficulty dressing or bathing?: No Does the patient have difficulty doing errands alone such as visiting a doctor's office or shopping?: No   Review of Systems   ROS  See HPI for ROS as well.  Review of Systems  Constitutional: Negative for activity change, appetite change, chills and fever.  HENT: Negative for congestion, nosebleeds, trouble swallowing and voice change.   Respiratory: Negative for cough, shortness of breath and wheezing.   Gastrointestinal: Negative for diarrhea, nausea and vomiting.  Genitourinary: Negative for difficulty urinating, dysuria, flank pain and hematuria.  Musculoskeletal: Negative for back pain, joint swelling and neck pain.  Neurological: Negative for dizziness, speech difficulty, light-headedness and numbness.  See HPI. All other review of systems negative.    Objective:   Vitals:   02/21/19 1120  BP: 115/76  Pulse: 89  Resp: 12  Temp: 98.7 F (37.1 C)  SpO2: 95%  Weight: 242 lb (109.8 kg)  Height: '5\' 5"'$  (1.651 m)   Wt Readings from Last 3 Encounters:  03/06/19 242 lb 12.8 oz (110.1 kg)  02/21/19 242 lb (109.8 kg)  01/21/19 245 lb (111.1 kg)    Body mass index is 40.27 kg/m.  Physical Exam  Physical Exam  Constitutional: Oriented to person, place, and time. Appears well-developed and well-nourished.  HENT:  Head: Normocephalic and atraumatic.  Eyes: Conjunctivae and EOM are normal.  Cardiovascular: Normal rate,  regular rhythm, normal heart sounds and intact distal pulses.  No murmur heard. Pulmonary/Chest: Effort normal and breath sounds normal. No stridor. No respiratory distress. Has no wheezes.  Abdomen/; nondistended, normoactive bs, soft, nontender Neurological: Is alert and oriented to person, place, and time.  Skin: Skin is warm. Capillary refill takes less than 2 seconds.  Psychiatric: Has a normal mood and affect. Behavior is normal. Judgment and thought content normal.  Chaperone present Breast exam showed symmetric breasts without masses External vaginal labia normal, no palpable masses, normal cervix, uterus nontender, pap smear collected   Assessment/Plan:   Patient was seen for a health maintenance exam.  Counseled the patient on health maintenance issues. Reviewed her health mainteance schedule and ordered appropriate tests (see orders.) Counseled on regular exercise and weight management. Recommend regular eye exams and dental cleaning.   The following issues were addressed today for health maintenance:   Val was seen today for annual exam.  Diagnoses and all orders for this visit:  Encounter for Medicare annual wellness exam Women's Health Maintenance Plan Advised monthly breast exam and annual mammogram Advised dental exam every six months Discussed stress management Discussed pap smear screening guidelines   Dyslipidemia -     CMP14+EGFR -     Lipid panel  Impaired fasting glucose -     Hemoglobin A1c  Encounter for Papanicolaou smear for cervical cancer screening -     Cytology - PAP  Screen for STD (sexually transmitted disease) -     HIV antibody -     Hepatitis C antibody, Reflex if positive  Screening for colon cancer -     Ambulatory referral to Gastroenterology  Encounter for screening mammogram for malignant neoplasm of breast -     MM Digital Screening; Future  Thyroid nodule -     TSH  Other orders -     nystatin (MYCOSTATIN/NYSTOP)  powder; Apply topically 2 (two) times daily. Apply to creases. Keep area dry. -     metFORMIN (GLUCOPHAGE-XR) 500 MG 24 hr tablet; Take 2 tablets (1,000 mg total)  by mouth 2 (two) times daily with breakfast and lunch. -     venlafaxine XR (EFFEXOR-XR) 150 MG 24 hr capsule; Take 1 capsule (150 mg total) by mouth daily with breakfast. -     zolpidem (AMBIEN) 10 MG tablet; Take 1 tablet (10 mg total) by mouth at bedtime as needed for sleep. -     Interpretation:    No follow-ups on file.    Body mass index is 40.27 kg/m.:  Discussed the patient's BMI with patient. The BMI body mass index is 40.27 kg/m.     Future Appointments  Date Time Provider [place]  03/18/2019  2:00 PM [facility] PREVISIT RM 68 [facility]  03/21/2019  1:30 PM Mauri Pole, MD [facility]  04/22/2019  4:10 PM [facility] MM 3 [facility] GI-BRE[facility] CE  04/30/2019 11:15 AM Deveshwar, Abel Presto, MD [facility] None    Patient Instructions  We recommend that you schedule a mammogram for breast cancer screening. Typically, you do not need a referral to do this. Please contact a local imaging center to schedule your mammogram.  [facility] ([place]) - [phone] or [phone]    Intertrigo Intertrigo is skin irritation or inflammation (dermatitis) that occurs when folds of skin rub together. The irritation can cause a rash and make skin raw and itchy. This condition most commonly occurs in the skin folds of these areas:  Toes.  Armpits.  Groin.  Under the belly.  Under the breasts.  Buttocks. Intertrigo is not passed from person to person (is not contagious). What are the causes? This condition is caused by heat, moisture, rubbing (friction), and not enough air circulation. The condition can be made worse by:  Sweat.  Bacteria.  A fungus, such as yeast. What increases the risk? This condition is more likely to occur if you have moisture in your skin folds. You are  more likely to develop this condition if you:  Have diabetes.  Are overweight.  Are not able to move around or are not active.  Live in a warm and moist climate.  Wear splints, braces, or other medical devices.  Are not able to control your bowels or bladder (have incontinence). What are the signs or symptoms? Symptoms of this condition include:  A pink or red skin rash in the skin fold or near the skin fold.  Raw or scaly skin.  Itchiness.  A burning feeling.  Bleeding.  Leaking fluid.  A bad smell. How is this diagnosed? This condition is diagnosed with a medical history and physical exam. You may also have a skin swab to test for bacteria or a fungus. How is this treated? This condition may be treated by:  Cleaning and drying your skin.  Taking an antibiotic medicine or using an antibiotic skin cream for a bacterial infection.  Using an antifungal cream on your skin or taking pills for an infection that was caused by a fungus, such as yeast.  Using a steroid ointment to relieve itchiness and irritation.  Separating the skin fold with a clean cotton cloth to absorb moisture and allow air to flow into the area. Follow these instructions at home:  Keep the affected area clean and dry.  Do not scratch your skin.  Stay in a cool environment as much as possible. Use an air conditioner or fan, if available.  Apply over-the-counter and prescription medicines only as told by your health care provider.  If you were prescribed an antibiotic medicine, use it  as told by your health care provider. Do not stop using the antibiotic even if your condition improves.  Keep all follow-up visits as told by your health care provider. This is important. How is this prevented?   Maintain a healthy weight.  Take care of your feet, especially if you have diabetes. Foot care includes: ? Wearing shoes that fit well. ? Keeping your feet dry. ? Wearing clean, breathable socks.   Protect the skin around your groin and buttocks, especially if you have incontinence. Skin protection includes: ? Following a regular cleaning routine. ? Using skin protectant creams, powders, or ointments. ? Changing protection pads frequently.  Do not wear tight clothes. Wear clothes that are loose, absorbent, and made of cotton.  Wear a bra that gives good support, if needed.  Shower and dry yourself well after activity or exercise. Use a hair dryer on a cool setting to dry between skin folds, especially after you bathe.  If you have diabetes, keep your blood sugar under control. Contact a health care provider if:  Your symptoms do not improve with treatment.  Your symptoms get worse or they spread.  You notice increased redness and warmth.  You have a fever. Summary  Intertrigo is skin irritation or inflammation (dermatitis) that occurs when folds of skin rub together.  This condition is caused by heat, moisture, rubbing (friction), and not enough air circulation.  This condition may be treated by cleaning and drying your skin and with medicines.  Apply over-the-counter and prescription medicines only as told by your health care provider.  Keep all follow-up visits as told by your health care provider. This is important. This information is not intended to replace advice given to you by your health care provider. Make sure you discuss any questions you have with your health care provider. Document Released: 04/03/2005 Document Revised: 09/03/2017 Document Reviewed: 09/03/2017 Elsevier Patient Education  2020 [employer].

## 2019-02-21 NOTE — Patient Instructions (Addendum)
We recommend that you schedule a mammogram for breast cancer screening. Typically, you do not need a referral to do this. Please contact a local imaging center to schedule your mammogram.  [facility] ([place]) - [phone] or [phone]    Intertrigo Intertrigo is skin irritation or inflammation (dermatitis) that occurs when folds of skin rub together. The irritation can cause a rash and make skin raw and itchy. This condition most commonly occurs in the skin folds of these areas:  Toes.  Armpits.  Groin.  Under the belly.  Under the breasts.  Buttocks. Intertrigo is not passed from person to person (is not contagious). What are the causes? This condition is caused by heat, moisture, rubbing (friction), and not enough air circulation. The condition can be made worse by:  Sweat.  Bacteria.  A fungus, such as yeast. What increases the risk? This condition is more likely to occur if you have moisture in your skin folds. You are more likely to develop this condition if you:  Have diabetes.  Are overweight.  Are not able to move around or are not active.  Live in a warm and moist climate.  Wear splints, braces, or other medical devices.  Are not able to control your bowels or bladder (have incontinence). What are the signs or symptoms? Symptoms of this condition include:  A pink or red skin rash in the skin fold or near the skin fold.  Raw or scaly skin.  Itchiness.  A burning feeling.  Bleeding.  Leaking fluid.  A bad smell. How is this diagnosed? This condition is diagnosed with a medical history and physical exam. You may also have a skin swab to test for bacteria or a fungus. How is this treated? This condition may be treated by:  Cleaning and drying your skin.  Taking an antibiotic medicine or using an antibiotic skin cream for a bacterial infection.  Using an antifungal cream on your skin or taking pills for an infection  that was caused by a fungus, such as yeast.  Using a steroid ointment to relieve itchiness and irritation.  Separating the skin fold with a clean cotton cloth to absorb moisture and allow air to flow into the area. Follow these instructions at home:  Keep the affected area clean and dry.  Do not scratch your skin.  Stay in a cool environment as much as possible. Use an air conditioner or fan, if available.  Apply over-the-counter and prescription medicines only as told by your health care provider.  If you were prescribed an antibiotic medicine, use it as told by your health care provider. Do not stop using the antibiotic even if your condition improves.  Keep all follow-up visits as told by your health care provider. This is important. How is this prevented?   Maintain a healthy weight.  Take care of your feet, especially if you have diabetes. Foot care includes: ? Wearing shoes that fit well. ? Keeping your feet dry. ? Wearing clean, breathable socks.  Protect the skin around your groin and buttocks, especially if you have incontinence. Skin protection includes: ? Following a regular cleaning routine. ? Using skin protectant creams, powders, or ointments. ? Changing protection pads frequently.  Do not wear tight clothes. Wear clothes that are loose, absorbent, and made of cotton.  Wear a bra that gives good support, if needed.  Shower and dry yourself well after activity or exercise. Use a hair dryer on a cool setting to dry  between skin folds, especially after you bathe.  If you have diabetes, keep your blood sugar under control. Contact a health care provider if:  Your symptoms do not improve with treatment.  Your symptoms get worse or they spread.  You notice increased redness and warmth.  You have a fever. Summary  Intertrigo is skin irritation or inflammation (dermatitis) that occurs when folds of skin rub together.  This condition is caused by heat,  moisture, rubbing (friction), and not enough air circulation.  This condition may be treated by cleaning and drying your skin and with medicines.  Apply over-the-counter and prescription medicines only as told by your health care provider.  Keep all follow-up visits as told by your health care provider. This is important. This information is not intended to replace advice given to you by your health care provider. Make sure you discuss any questions you have with your health care provider. Document Released: 04/03/2005 Document Revised: 09/03/2017 Document Reviewed: 09/03/2017 Elsevier Patient Education  2020 [employer].

## 2019-02-22 LAB — CMP14+EGFR
ALT: 42 IU/L — ABNORMAL HIGH (ref 0–32)
AST: 28 IU/L (ref 0–40)
Albumin/Globulin Ratio: 2.2 (ref 1.2–2.2)
Albumin: 4.7 g/dL (ref 3.8–4.9)
Alkaline Phosphatase: 126 IU/L — ABNORMAL HIGH (ref 39–117)
BUN/Creatinine Ratio: 13 (ref 9–23)
BUN: 8 mg/dL (ref 6–24)
Bilirubin Total: 0.3 mg/dL (ref 0.0–1.2)
CO2: 22 mmol/L (ref 20–29)
Calcium: 10 mg/dL (ref 8.7–10.2)
Chloride: 106 mmol/L (ref 96–106)
Creatinine, Ser: 0.64 mg/dL (ref 0.57–1.00)
GFR calc Af Amer: 115 mL/min/{1.73_m2} (ref >59)
GFR calc non Af Amer: 99 mL/min/{1.73_m2} (ref >59)
Globulin, Total: 2.1 g/dL (ref 1.5–4.5)
Glucose: 97 mg/dL (ref 65–99)
Potassium: 4 mmol/L (ref 3.5–5.2)
Sodium: 143 mmol/L (ref 134–144)
Total Protein: 6.8 g/dL (ref 6.0–8.5)

## 2019-02-22 LAB — TSH: TSH: 1.12 u[IU]/mL (ref 0.450–4.500)

## 2019-02-22 LAB — HEMOGLOBIN A1C
Est. average glucose Bld gHb Est-mCnc: 123 mg/dL
Hgb A1c MFr Bld: 5.9 % — ABNORMAL HIGH (ref 4.8–5.6)

## 2019-02-22 LAB — LIPID PANEL
Chol/HDL Ratio: 4.6 ratio — ABNORMAL HIGH (ref 0.0–4.4)
Cholesterol, Total: 230 mg/dL — ABNORMAL HIGH (ref 100–199)
HDL: 50 mg/dL (ref >39)
LDL Chol Calc (NIH): 156 mg/dL — ABNORMAL HIGH (ref 0–99)
Triglycerides: 132 mg/dL (ref 0–149)
VLDL Cholesterol Cal: 24 mg/dL (ref 5–40)

## 2019-02-22 LAB — HCV INTERPRETATION

## 2019-02-22 LAB — HCV AB W/RFLX TO VERIFICATION: HCV Ab: <0.1 s/co ratio (ref 0.0–0.9)

## 2019-02-22 LAB — HIV ANTIBODY (ROUTINE TESTING W REFLEX): HIV Screen 4th Generation wRfx: NONREACTIVE

## 2019-02-23 ENCOUNTER — Encounter: Payer: Self-pay | Admitting: Family Medicine

## 2019-02-25 ENCOUNTER — Telehealth: Payer: Self-pay | Admitting: *Deleted

## 2019-02-25 ENCOUNTER — Encounter: Payer: Self-pay | Admitting: Family Medicine

## 2019-02-25 DIAGNOSIS — F3170 Bipolar disorder, currently in remission, most recent episode unspecified: Secondary | ICD-10-CM

## 2019-02-25 NOTE — Telephone Encounter (Signed)
Dr. Nolon Rod,  Ivin Booty said she spoke with you at her last visit about a referral for physiatrist.  She said she would like to see Apolinar Junes on [facility] [phone].     She states she needs a refill of her Abilify.  She states that she talked about going on oziempic for  Her insurance will only cover the pen .25 or .05  They will cover rybelsus generic.

## 2019-02-26 ENCOUNTER — Encounter: Payer: Self-pay | Admitting: *Deleted

## 2019-02-26 LAB — CYTOLOGY - PAP
Chlamydia: NEGATIVE
Comment: NEGATIVE
Comment: NEGATIVE
Comment: NORMAL
Diagnosis: NEGATIVE
High risk HPV: NEGATIVE
Neisseria Gonorrhea: NEGATIVE

## 2019-02-26 MED ORDER — ARIPIPRAZOLE 10 MG PO TABS: 10.0000 mg | ORAL_TABLET | Freq: Every day | ORAL | 0 refills | Status: DC

## 2019-02-26 MED ORDER — OZEMPIC (0.25 OR 0.5 MG/DOSE) 2 MG/1.5ML ~~LOC~~ SOPN: 0.2500 mg | PEN_INJECTOR | SUBCUTANEOUS | 3 refills | Status: DC

## 2019-02-26 NOTE — Addendum Note (Signed)
Addended by: Delia Chimes A on: 02/26/2019 03:08 PM   Modules accepted: Orders

## 2019-02-26 NOTE — Telephone Encounter (Signed)
Will do one time refill until she establishes with Psychiatry.

## 2019-02-27 ENCOUNTER — Telehealth: Payer: Self-pay | Admitting: Family Medicine

## 2019-02-27 DIAGNOSIS — F3170 Bipolar disorder, currently in remission, most recent episode unspecified: Secondary | ICD-10-CM

## 2019-02-27 NOTE — Telephone Encounter (Signed)
I called for a psychiatrist and they said they need a referral please make a referral out for the following  [name]  Psychiatrist  [facility]  [phone]  [place], [place], [address]  please note the last referral that I received was for therapy and he could not write a prescription and he did not help with finding a provider that would do so. Also I am specifically requesting [name] as a female doctor   Pt sent msg via mychart as an appointment request for another facility. Please advise

## 2019-03-06 ENCOUNTER — Other Ambulatory Visit: Payer: Self-pay

## 2019-03-06 ENCOUNTER — Ambulatory Visit (AMBULATORY_SURGERY_CENTER): Payer: Medicare Other | Admitting: *Deleted

## 2019-03-06 VITALS — Temp 97.0°F | Ht 65.0 in | Wt 242.8 lb

## 2019-03-06 DIAGNOSIS — Z1159 Encounter for screening for other viral diseases: Secondary | ICD-10-CM

## 2019-03-06 DIAGNOSIS — Z1211 Encounter for screening for malignant neoplasm of colon: Secondary | ICD-10-CM

## 2019-03-06 MED ORDER — SUPREP BOWEL PREP KIT 17.5-3.13-1.6 GM/177ML PO SOLN: 1.0000 | Freq: Once | ORAL | 0 refills | Status: AC

## 2019-03-06 NOTE — Progress Notes (Signed)
Patient denies any allergies to egg or soy products. Patient denies complications with anesthesia/sedation.  Patient denies oxygen use at home and denies diet medications. Emmi instructions for colonoscopy/endoscopy explained and given to patient.   

## 2019-03-07 ENCOUNTER — Encounter: Payer: Self-pay | Admitting: Gastroenterology

## 2019-03-15 MED ORDER — METFORMIN HCL ER 500 MG PO TB24: 1000.0000 mg | ORAL_TABLET | Freq: Two times a day (BID) | ORAL | 3 refills | Status: DC

## 2019-03-15 MED ORDER — VENLAFAXINE HCL ER 150 MG PO CP24: 150.0000 mg | ORAL_CAPSULE | Freq: Every day | ORAL | 3 refills | Status: DC

## 2019-03-15 MED ORDER — ZOLPIDEM TARTRATE 10 MG PO TABS: 10.0000 mg | ORAL_TABLET | Freq: Every evening | ORAL | 3 refills | Status: DC | PRN

## 2019-03-18 ENCOUNTER — Other Ambulatory Visit: Payer: Self-pay | Admitting: Gastroenterology

## 2019-03-18 ENCOUNTER — Ambulatory Visit (INDEPENDENT_AMBULATORY_CARE_PROVIDER_SITE_OTHER): Payer: Medicare Other

## 2019-03-18 DIAGNOSIS — Z1159 Encounter for screening for other viral diseases: Secondary | ICD-10-CM

## 2019-03-19 LAB — SARS CORONAVIRUS 2 (TAT 6-24 HRS): SARS Coronavirus 2: NEGATIVE

## 2019-03-20 NOTE — Telephone Encounter (Signed)
Please advise 

## 2019-03-21 ENCOUNTER — Ambulatory Visit (AMBULATORY_SURGERY_CENTER): Payer: Medicare Other | Admitting: Gastroenterology

## 2019-03-21 ENCOUNTER — Other Ambulatory Visit: Payer: Self-pay

## 2019-03-21 ENCOUNTER — Encounter: Payer: Self-pay | Admitting: Gastroenterology

## 2019-03-21 VITALS — BP 120/56 | HR 71 | Temp 98.6°F | Resp 14 | Ht 65.0 in | Wt 242.8 lb

## 2019-03-21 DIAGNOSIS — Z1211 Encounter for screening for malignant neoplasm of colon: Secondary | ICD-10-CM

## 2019-03-21 MED ORDER — SODIUM CHLORIDE 0.9 % IV SOLN: 500.0000 mL | Freq: Once | INTRAVENOUS | Status: DC

## 2019-03-21 NOTE — Op Note (Signed)
[facility] Patient Name: Nichole BardSharon Nicolosi Procedure Date: 03/21/2019 1:25 PM MRN: [id number] Endoscopist: Napoleon FormKavitha V. Nandigam , MD Age: [age] Referring MD:  Date of Birth: Jul 14, 1960 Gender: Female Account #: [id number] Procedure:                Colonoscopy Indications:              Screening for colorectal malignant neoplasm Medicines:                Monitored Anesthesia Care Procedure:                Pre-Anesthesia Assessment:                           - Prior to the procedure, a History and Physical                            was performed, and patient medications and                            allergies were reviewed. The patient's tolerance of                            previous anesthesia was also reviewed. The risks                            and benefits of the procedure and the sedation                            options and risks were discussed with the patient.                            All questions were answered, and informed consent                            was obtained. Prior Anticoagulants: The patient has                            taken no previous anticoagulant or antiplatelet                            agents. ASA Grade Assessment: II - A patient with                            mild systemic disease. After reviewing the risks                            and benefits, the patient was deemed in                            satisfactory condition to undergo the procedure.                           After obtaining informed consent, the colonoscope  was passed under direct vision. Throughout the                            procedure, the patient's blood pressure, pulse, and                            oxygen saturations were monitored continuously. The                            Colonoscope was introduced through the anus and                            advanced to the the cecum, identified by                            appendiceal orifice  and ileocecal valve. The                            colonoscopy was performed without difficulty. The                            patient tolerated the procedure well. The quality                            of the bowel preparation was adequate. The                            ileocecal valve, appendiceal orifice, and rectum                            were photographed. Scope In: 1:32:13 PM Scope Out: 1:48:23 PM Scope Withdrawal Time: 0 hours 12 minutes 22 seconds  Total Procedure Duration: 0 hours 16 minutes 10 seconds  Findings:                 The perianal and digital rectal examinations were                            normal.                           Non-bleeding internal hemorrhoids were found during                            retroflexion. The hemorrhoids were small.                           The exam was otherwise without abnormality. Complications:            No immediate complications. Estimated Blood Loss:     Estimated blood loss: none. Impression:               - Non-bleeding internal hemorrhoids.                           - The examination was otherwise normal.                           -  No specimens collected. Recommendation:           - Patient has a contact number available for                            emergencies. The signs and symptoms of potential                            delayed complications were discussed with the                            patient. Return to normal activities tomorrow.                            Written discharge instructions were provided to the                            patient.                           - Resume previous diet.                           - Continue present medications.                           - Repeat colonoscopy in 10 years for screening                            purposes. Mauri Pole, MD 03/21/2019 1:53:20 PM This report has been signed electronically.

## 2019-03-21 NOTE — Patient Instructions (Signed)
Information on hemorrhoids given to you today.  YOU HAD AN ENDOSCOPIC PROCEDURE TODAY AT THE [facility]:   Refer to the procedure report that was given to you for any specific questions about what was found during the examination.  If the procedure report does not answer your questions, please call your gastroenterologist to clarify.  If you requested that your care partner not be given the details of your procedure findings, then the procedure report has been included in a sealed envelope for you to review at your convenience later.  YOU SHOULD EXPECT: Some feelings of bloating in the abdomen. Passage of more gas than usual.  Walking can help get rid of the air that was put into your GI tract during the procedure and reduce the bloating. If you had a lower endoscopy (such as a colonoscopy or flexible sigmoidoscopy) you may notice spotting of blood in your stool or on the toilet paper. If you underwent a bowel prep for your procedure, you may not have a normal bowel movement for a few days.  Please Note:  You might notice some irritation and congestion in your nose or some drainage.  This is from the oxygen used during your procedure.  There is no need for concern and it should clear up in a day or so.  SYMPTOMS TO REPORT IMMEDIATELY:   Following lower endoscopy (colonoscopy or flexible sigmoidoscopy):  Excessive amounts of blood in the stool  Significant tenderness or worsening of abdominal pains  Swelling of the abdomen that is new, acute  Fever of 100F or higher   For urgent or emergent issues, a gastroenterologist can be reached at any hour by calling [phone].   DIET:  We do recommend a small meal at first, but then you may proceed to your regular diet.  Drink plenty of fluids but you should avoid alcoholic beverages for 24 hours.  ACTIVITY:  You should plan to take it easy for the rest of today and you should NOT DRIVE or use heavy machinery until tomorrow (because  of the sedation medicines used during the test).    FOLLOW UP: Our staff will call the number listed on your records 48-72 hours following your procedure to check on you and address any questions or concerns that you may have regarding the information given to you following your procedure. If we do not reach you, we will leave a message.  We will attempt to reach you two times.  During this call, we will ask if you have developed any symptoms of COVID 19. If you develop any symptoms (ie: fever, flu-like symptoms, shortness of breath, cough etc.) before then, please call [phone].  If you test positive for Covid 19 in the 2 weeks post procedure, please call and report this information to us.    If any biopsies were taken you will be contacted by phone or by letter within the next 1-3 weeks.  Please call us at [phone] if you have not heard about the biopsies in 3 weeks.    SIGNATURES/CONFIDENTIALITY: You and/or your care partner have signed paperwork which will be entered into your electronic medical record.  These signatures attest to the fact that that the information above on your After Visit Summary has been reviewed and is understood.  Full responsibility of the confidentiality of this discharge information lies with you and/or your care-partner. 

## 2019-03-21 NOTE — Progress Notes (Signed)
VS-[place] Temp- JB  Pt's states no medical or surgical changes since previsit or office visit.  

## 2019-03-21 NOTE — Progress Notes (Signed)
To PACU, VSS. Report to [email or web address].tb 

## 2019-03-24 ENCOUNTER — Telehealth: Payer: Self-pay | Admitting: Family Medicine

## 2019-03-24 NOTE — Telephone Encounter (Signed)
Referral in [place]ax to pcp faxed to [facility] referral pool  12.07.2020   FR

## 2019-03-25 ENCOUNTER — Telehealth: Payer: Self-pay

## 2019-03-25 NOTE — Telephone Encounter (Signed)
  Follow up Call-  Call back number 03/21/2019  Post procedure Call Back phone  # [phone]  Permission to leave phone message Yes  Some recent data might be hidden     Patient questions:  Do you have a fever, pain , or abdominal swelling? No. Pain Score  0 *  Have you tolerated food without any problems? Yes.    Have you been able to return to your normal activities? Yes.    Do you have any questions about your discharge instructions: Diet   No. Medications  No. Follow up visit  No.  Do you have questions or concerns about your Care? No.  Actions: * If pain score is 4 or above: No action needed, pain <4. 1. Have you developed a fever since your procedure? no  2.   Have you had an respiratory symptoms (SOB or cough) since your procedure? no  3.   Have you tested positive for COVID 19 since your procedure no  4.   Have you had any family members/close contacts diagnosed with the COVID 19 since your procedure?  no   If yes to any of these questions please route to Joylene John, RN and Alphonsa Gin, Therapist, sports.

## 2019-03-26 ENCOUNTER — Ambulatory Visit: Payer: Medicare Other | Admitting: Psychiatry

## 2019-03-27 ENCOUNTER — Encounter: Payer: Self-pay | Admitting: Family Medicine

## 2019-04-01 ENCOUNTER — Telehealth: Payer: Self-pay | Admitting: Family Medicine

## 2019-04-01 NOTE — Telephone Encounter (Signed)
Medication Refill - Medication: Semaglutide,0.25 or 0.5MG /DOS, (OZEMPIC, 0.25 OR 0.5 MG/DOSE,) 2 MG/1.5ML SOPN  Pt would like the dose increased to 0.5  Has the patient contacted their pharmacy? No. (Agent: If no, request that the patient contact the pharmacy for the refill.) (Agent: If yes, when and what did the pharmacy advise?)  Preferred Pharmacy (with phone number or street name): [facility]  [id number] - [place], [address] Phone:  [phone]       Agent: Please be advised that RX refills may take up to 3 business days. We ask that you follow-up with your pharmacy.

## 2019-04-08 ENCOUNTER — Telehealth: Payer: Self-pay | Admitting: Family Medicine

## 2019-04-08 NOTE — Telephone Encounter (Signed)
Pt is wanting to increase Semaglutide,0.25 or 0.5MG /DOS, (OZEMPIC, 0.25 OR 0.5 MG/DOSE,) 2 MG/1.5ML SOPN   Wants to increase to a different dose. Patient states that it has been more than a week for this request FR

## 2019-04-09 MED ORDER — OZEMPIC (0.25 OR 0.5 MG/DOSE) 2 MG/1.5ML ~~LOC~~ SOPN: 0.5000 mg | PEN_INJECTOR | SUBCUTANEOUS | 3 refills | Status: DC

## 2019-04-09 NOTE — Telephone Encounter (Signed)
Updated and increased rx has been sent to pharmacy

## 2019-04-09 NOTE — Telephone Encounter (Addendum)
Please advise on the medication dose increase for OZEMPIC.   Thanks,  I have spoken to the provider and increase of medication has been sent to pharmacy. I have called the pt and informed her of this and she stated understanding.

## 2019-04-17 ENCOUNTER — Ambulatory Visit (INDEPENDENT_AMBULATORY_CARE_PROVIDER_SITE_OTHER): Payer: Medicare Other | Admitting: Psychiatry

## 2019-04-17 ENCOUNTER — Other Ambulatory Visit: Payer: Self-pay

## 2019-04-17 ENCOUNTER — Telehealth: Payer: Self-pay

## 2019-04-17 ENCOUNTER — Encounter: Payer: Self-pay | Admitting: Psychiatry

## 2019-04-17 DIAGNOSIS — Z9189 Other specified personal risk factors, not elsewhere classified: Secondary | ICD-10-CM

## 2019-04-17 DIAGNOSIS — F3162 Bipolar disorder, current episode mixed, moderate: Secondary | ICD-10-CM

## 2019-04-17 DIAGNOSIS — F4310 Post-traumatic stress disorder, unspecified: Secondary | ICD-10-CM | POA: Diagnosis not present

## 2019-04-17 MED ORDER — ARIPIPRAZOLE 15 MG PO TABS: 15.0000 mg | ORAL_TABLET | Freq: Every day | ORAL | 1 refills | Status: DC

## 2019-04-17 MED ORDER — GABAPENTIN 100 MG PO CAPS: 100.0000 mg | ORAL_CAPSULE | Freq: Every day | ORAL | 1 refills | Status: DC

## 2019-04-17 MED ORDER — ZOLPIDEM TARTRATE ER 12.5 MG PO TBCR: 12.5000 mg | EXTENDED_RELEASE_TABLET | Freq: Every evening | ORAL | 0 refills | Status: DC | PRN

## 2019-04-17 NOTE — Telephone Encounter (Signed)
  went online to submit the prior authorization. just a FYI this medication is not in her network plan. it was asked   The following alternative(s) is/are the preferred alternative(s): Belsomra, Ramelteon, Trazodone. Would you like to switch to the provided preferred alternative(s)?   I went ahead and submitted the PA but it may not approve.
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
## 2019-04-17 NOTE — Telephone Encounter (Signed)
We can wait for PA .

## 2019-04-17 NOTE — Telephone Encounter (Signed)
Received a fax that a prior auth was needed for the zolpidemem

## 2019-04-21 ENCOUNTER — Encounter: Payer: Self-pay | Admitting: Family Medicine

## 2019-04-21 MED ORDER — BELSOMRA 10 MG PO TABS: 5.0000 mg | ORAL_TABLET | Freq: Every day | ORAL | 0 refills | Status: DC

## 2019-04-21 NOTE — Telephone Encounter (Signed)
Returned call to patient.  Discussed with her Ambien extended release will not be approved by her health insurance plan.  Discussed Belsomra which is alternative option.  She agrees with plan.  Will start Belsomra 5 mg and advised her to increase it to 10 mg if the 5 does not work.  We will send it to her pharmacy.

## 2019-04-21 NOTE — Telephone Encounter (Signed)
prior Nichole Fox was denied pt need to try and fail two of the following medication   belsomta, ramelteon, trazodone

## 2019-04-22 ENCOUNTER — Ambulatory Visit
Admission: RE | Admit: 2019-04-22 | Discharge: 2019-04-22 | Disposition: A | Discharge status: Home / Self Care | Payer: Medicare Other | Source: Ambulatory Visit | Attending: Family Medicine | Admitting: Family Medicine

## 2019-04-22 ENCOUNTER — Ambulatory Visit (HOSPITAL_COMMUNITY): Payer: Medicare Other | Admitting: Licensed Clinical Social Worker

## 2019-04-22 ENCOUNTER — Other Ambulatory Visit: Payer: Self-pay

## 2019-04-22 DIAGNOSIS — Z1231 Encounter for screening mammogram for malignant neoplasm of breast: Secondary | ICD-10-CM

## 2019-04-24 NOTE — Telephone Encounter (Signed)
Referral placed for Dr. Madaline Guthrie.  Please fax to the office so she can make the appointment.

## 2019-04-28 NOTE — Telephone Encounter (Signed)
Called Dr Madaline Guthrie office left a msg on their machine to see what we need to do or fax to their office to give them information so they can get this patient scheduled to be seen.

## 2019-04-30 ENCOUNTER — Ambulatory Visit: Payer: Medicare Other | Admitting: Rheumatology

## 2019-05-01 NOTE — Telephone Encounter (Signed)
Reach out to Dr Madaline Guthrie office once again to see have they got this patient scheduled to be seen. They stated the patient has registered and they will contact her to see about getting her scheduled for an appt.

## 2019-05-12 ENCOUNTER — Encounter: Payer: Self-pay | Admitting: Psychiatry

## 2019-05-12 ENCOUNTER — Ambulatory Visit (INDEPENDENT_AMBULATORY_CARE_PROVIDER_SITE_OTHER): Payer: Medicare Other | Admitting: Psychiatry

## 2019-05-12 ENCOUNTER — Other Ambulatory Visit: Payer: Self-pay

## 2019-05-12 DIAGNOSIS — F3162 Bipolar disorder, current episode mixed, moderate: Secondary | ICD-10-CM | POA: Diagnosis not present

## 2019-05-12 DIAGNOSIS — F4310 Post-traumatic stress disorder, unspecified: Secondary | ICD-10-CM | POA: Diagnosis not present

## 2019-05-12 MED ORDER — RAMELTEON 8 MG PO TABS: 8.0000 mg | ORAL_TABLET | Freq: Every day | ORAL | 0 refills | Status: DC

## 2019-05-12 NOTE — Progress Notes (Signed)
Virtual Visit via Video Note  I connected with Nichole Fox on 05/12/19 at  9:20 AM EST by a video enabled telemedicine application and verified that I am speaking with the correct person using two identifiers.   I discussed the limitations of evaluation and management by telemedicine and the availability of in person appointments. The patient expressed understanding and agreed to proceed.     I discussed the assessment and treatment plan with the patient. The patient was provided an opportunity to ask questions and all were answered. The patient agreed with the plan and demonstrated an understanding of the instructions.   The patient was advised to call back or seek an in-person evaluation if the symptoms worsen or if the condition fails to improve as anticipated.  BH MD OP Progress Note  05/12/2019 10:36 AM Nichole Fox  MRN:  [id number]  Chief Complaint:  Chief Complaint    Follow-up     [name] is a 59 year old African-American female, on disability, lives currently in [place] with her aunt, has a history of bipolar disorder, PTSD, prediabetes, arthritis was evaluated by telemedicine today.  Patient today reports she is currently struggling with sleep problems.  She reports the Belsomra did not help her so she increased the dosage herself to 20 mg.  She reports it did not give her deep sleep and so she stopped taking it.  She went back to her Ambien.  She reports the Ambien gives her only 4 to 5 hours of sleep.  She continues to have nightmares on and off.  Patient reports mood symptoms as improving.  She denies any significant mood lability, irritability at this time.  Patient reports gabapentin as helpful for her mood swings.  She denies side effects.  Patient denies any suicidality, homicidality or perceptual disturbances.  Patient reports she has appointment scheduled with therapist and she is motivated to start psychotherapy sessions  soon.  Discussed starting Rozerem for her sleep.  Patient agrees with plan.   Visit Diagnosis:    ICD-10-CM   1. Bipolar 1 disorder, mixed, moderate (HCC)  F31.62 ramelteon (ROZEREM) 8 MG tablet  2. PTSD (post-traumatic stress disorder)  F43.10 ramelteon (ROZEREM) 8 MG tablet    Past Psychiatric History: I have reviewed past psychiatric history from my progress note on 04/17/2019.  Past trials of lithium, Effexor, Depakote, Ambien, Belsomra, trazodone.  Past Medical History:  Past Medical History:  Diagnosis Date  . Allergy   . Arthritis   . Bipolar disorder (HCC)   . Depression   . Goiter    on thyrodi, MD just watching  . Hyperlipidemia    diet controlled no meds  . Pre-diabetes    metformin, ozempic  . Psoriasis   . Psoriasis   . Psoriatic arthritis (HCC)   . PTSD (post-traumatic stress disorder)   . Sickle cell trait [facility])     Past Surgical History:  Procedure Laterality Date  . abortions     x2  . COLONOSCOPY     greater 10 yrs ago no polyps  . EYE SURGERY     lasik  . KNEE ARTHROSCOPY Left   . TONSILLECTOMY    . WISDOM TOOTH EXTRACTION      Family Psychiatric History: I have reviewed family psychiatric history from my progress note on 04/17/2019.  Family History:  Family History  Problem Relation Age of Onset  . Diabetes Mother   . Depression Mother   . Mental illness Mother   . Bipolar disorder Mother   .  Thyroid disease Father   . Anemia Father   . Colon cancer Neg Hx   . Stomach cancer Neg Hx   . Rectal cancer Neg Hx   . Esophageal cancer Neg Hx     Social History: Reviewed social history from my progress note on 04/17/2019. Social History   Socioeconomic History  . Marital status: Single    Spouse name: Not on file  . Number of children: Not on file  . Years of education: Not on file  . Highest education level: Not on file  Occupational History    Comment: on disability due to mood disorder  Tobacco Use  . Smoking status: Never  Smoker  . Smokeless tobacco: Never Used  Substance and Sexual Activity  . Alcohol use: Yes    Comment: socially  . Drug use: Never  . Sexual activity: Not Currently    Birth control/protection: Post-menopausal  Other Topics Concern  . Not on file  Social History Narrative  . Not on file   Social Determinants of Health   Financial Resource Strain: Low Risk   . Difficulty of Paying Living Expenses: Not hard at all  Food Insecurity: No Food Insecurity  . Worried About Programme researcher, broadcasting/film/video in the Last Year: Never true  . Ran Out of Food in the Last Year: Never true  Transportation Needs: No Transportation Needs  . Lack of Transportation (Medical): No  . Lack of Transportation (Non-Medical): No  Physical Activity: Inactive  . Days of Exercise per Week: 0 days  . Minutes of Exercise per Session: 0 min  Stress: Stress Concern Present  . Feeling of Stress : To some extent  Social Connections: Slightly Isolated  . Frequency of Communication with Friends and Family: More than three times a week  . Frequency of Social Gatherings with Friends and Family: More than three times a week  . Attends Religious Services: More than 4 times per year  . Active Member of Clubs or Organizations: Yes  . Attends Banker Meetings: More than 4 times per year  . Marital Status: Divorced    Allergies:  Allergies  Allergen Reactions  . Erythromycin Nausea Only, Other (See Comments) and Nausea And Vomiting    Upset stomach  . Lithium Rash    Exacerbates eczema and psoriasis  . Shellfish Allergy Swelling, Rash and Nausea And Vomiting  . Strawberry Extract     Metabolic Disorder Labs: Lab Results  Component Value Date   HGBA1C 5.9 (H) 02/21/2019   No results found for: PROLACTIN Lab Results  Component Value Date   CHOL 230 (H) 02/21/2019   TRIG 132 02/21/2019   HDL 50 02/21/2019   CHOLHDL 4.6 (H) 02/21/2019   LDLCALC 156 (H) 02/21/2019   LDLCALC 171 (H) 11/18/2018   Lab  Results  Component Value Date   TSH 1.120 02/21/2019   TSH 1.200 11/18/2018    Therapeutic Level Labs: No results found for: LITHIUM No results found for: VALPROATE No components found for:  CBMZ  Current Medications: Current Outpatient Medications  Medication Sig Dispense Refill  . ARIPiprazole (ABILIFY) 15 MG tablet Take 1 tablet (15 mg total) by mouth daily with breakfast. 30 tablet 1  . betamethasone dipropionate (DIPROLENE) 0.05 % ointment Apply topically 2 (two) times daily. 45 g 6  . Calcium Carb-Cholecalciferol (CALCIUM 1000 + D PO) Take 1 tablet by mouth.    . clobetasol ointment (TEMOVATE) 0.05 % Apply 1 application topically 2 (two) times daily. 30 g  11  . fluticasone (VERAMYST) 27.5 MCG/SPRAY nasal spray Place 2 sprays into the nose daily. 10 g 11  . gabapentin (NEURONTIN) 100 MG capsule Take 1 capsule (100 mg total) by mouth at bedtime. 30 capsule 1  . metFORMIN (GLUCOPHAGE-XR) 500 MG 24 hr tablet Take 2 tablets (1,000 mg total) by mouth 2 (two) times daily with breakfast and lunch. 180 tablet 3  . nystatin (MYCOSTATIN/NYSTOP) powder Apply topically 2 (two) times daily. Apply to creases. Keep area dry. 15 g 1  . ramelteon (ROZEREM) 8 MG tablet Take 1 tablet (8 mg total) by mouth at bedtime. 10 tablet 0  . Semaglutide,0.25 or 0.5MG /DOS, (OZEMPIC, 0.25 OR 0.5 MG/DOSE,) 2 MG/1.5ML SOPN Inject 0.5 mg into the skin once a week. 4 pen 3  . venlafaxine XR (EFFEXOR-XR) 150 MG 24 hr capsule Take 1 capsule (150 mg total) by mouth daily with breakfast. 90 capsule 3   No current facility-administered medications for this visit.     Musculoskeletal: Strength & Muscle Tone: UTA Gait & Station: normal Patient leans: N/A  Psychiatric Specialty Exam: Review of Systems  Psychiatric/Behavioral: Positive for dysphoric mood and sleep disturbance.  All other systems reviewed and are negative.   There were no vitals taken for this visit.There is no height or weight on file to calculate  BMI.  General Appearance: Casual  Eye Contact:  Fair  Speech:  Clear and Coherent  Volume:  Normal  Mood:  Depressed  Affect:  Congruent  Thought Process:  Goal Directed and Descriptions of Associations: Intact  Orientation:  Full (Time, Place, and Person)  Thought Content: Logical   Suicidal Thoughts:  No  Homicidal Thoughts:  No  Memory:  Immediate;   Fair Recent;   Fair Remote;   Fair  Judgement:  Fair  Insight:  Fair  Psychomotor Activity:  Normal  Concentration:  Concentration: Fair and Attention Span: Fair  Recall:  [employer] of Knowledge: Fair  Language: Fair  Akathisia:  No  Handed:  Right  AIMS (if indicated):denies tremors, rigidity  Assets:  Communication Skills Desire for Improvement Housing Social Support  ADL's:  Intact  Cognition: WNL  Sleep:  Restless   Screenings: GAD-7     Office Visit from 02/21/2019 in Primary Care at [facility]  Total GAD-7 Score  9    PHQ2-9     Office Visit from 02/21/2019 in Primary Care at [place] from 11/18/2018 in [place] at [facility] Total Score  4  0  PHQ-9 Total Score  15  -       Assessment and Plan: Nichole Fox is a 59 year old African-American female who has a history of bipolar disorder, PTSD, arthritis, prediabetes was evaluated by telemedicine today.  Patient is biologically predisposed given her history of trauma as well as health problems and family history of mental health problems.  Patient also with psychosocial stressors of current pandemic, separation from her husband and recent relocation.  Patient however is currently making progress with regards to her mood symptoms although she continues to struggle with sleep.  She will benefit from medication readjustment and psychotherapy sessions.  Plan as noted below.  Plan Bipolar disorder-improving Gabapentin 100 mg p.o. nightly Abilify 15 mg p.o. daily in the morning Venlafaxine as prescribed  PTSD-unstable Patient continues to struggle with  sleep Discontinue Ambien for lack of benefit Discontinue Belsomra. Start Rozerem 8 mg p.o. nightly Patient has upcoming appointment with therapist for psychotherapy sessions.  Pending EKG-to monitor QTC.  Follow-up  in clinic in 2 weeks or sooner if needed.  February 10 at 1 PM  I have spent atleast 20 minutes non face to face with patient today. More than 50 % of the time was spent for ordering medications and test ,psychoeducation and supportive psychotherapy and care coordination,as well as documenting clinical information in electronic health record. This note was generated in part or whole with voice recognition software. Voice recognition is usually quite accurate but there are transcription errors that can and very often do occur. I apologize for any typographical errors that were not detected and corrected.         Jomarie Longs, MD 05/12/2019, 10:36 AM

## 2019-05-19 ENCOUNTER — Telehealth: Payer: Self-pay | Admitting: Psychiatry

## 2019-05-19 DIAGNOSIS — F3162 Bipolar disorder, current episode mixed, moderate: Secondary | ICD-10-CM

## 2019-05-19 DIAGNOSIS — F4310 Post-traumatic stress disorder, unspecified: Secondary | ICD-10-CM

## 2019-05-19 MED ORDER — RAMELTEON 8 MG PO TABS: 8.0000 mg | ORAL_TABLET | Freq: Every day | ORAL | 0 refills | Status: DC

## 2019-05-19 NOTE — Telephone Encounter (Signed)
Returned call to patient - patient reports Rozerem helps to some extent , however she has difficulty falling asleep . Advised to take it 2-3 hrs prior to bedtime and also give it few more days since it works some days.

## 2019-05-20 ENCOUNTER — Telehealth: Payer: Self-pay

## 2019-05-20 NOTE — Telephone Encounter (Signed)
Error

## 2019-05-28 ENCOUNTER — Encounter: Payer: Self-pay | Admitting: Psychiatry

## 2019-05-28 ENCOUNTER — Telehealth: Payer: Self-pay

## 2019-05-28 ENCOUNTER — Ambulatory Visit (INDEPENDENT_AMBULATORY_CARE_PROVIDER_SITE_OTHER): Payer: Medicare Other | Admitting: Psychiatry

## 2019-05-28 ENCOUNTER — Other Ambulatory Visit: Payer: Self-pay

## 2019-05-28 DIAGNOSIS — F3162 Bipolar disorder, current episode mixed, moderate: Secondary | ICD-10-CM

## 2019-05-28 DIAGNOSIS — F4310 Post-traumatic stress disorder, unspecified: Secondary | ICD-10-CM

## 2019-05-28 MED ORDER — ZOLPIDEM TARTRATE ER 12.5 MG PO TBCR: 12.5000 mg | EXTENDED_RELEASE_TABLET | Freq: Every evening | ORAL | 0 refills | Status: DC | PRN

## 2019-05-28 NOTE — Telephone Encounter (Signed)
received notice that a prior auth was needed on the zolpidem er

## 2019-05-28 NOTE — Telephone Encounter (Signed)
went online and submitted the prior auth for the zolpidem - pending review.

## 2019-05-28 NOTE — Progress Notes (Signed)
Virtual Visit via Video Note Provider Location : [facility] Patient Location : Home   I connected with Nichole Fox on 05/28/19 at  1:00 PM EST by a video enabled telemedicine application and verified that I am speaking with the correct person using two identifiers.   I discussed the limitations of evaluation and management by telemedicine and the availability of in person appointments. The patient expressed understanding and agreed to proceed.     I discussed the assessment and treatment plan with the patient. The patient was provided an opportunity to ask questions and all were answered. The patient agreed with the plan and demonstrated an understanding of the instructions.   The patient was advised to call back or seek an in-person evaluation if the symptoms worsen or if the condition fails to improve as anticipated.  BH MD OP Progress Note  05/28/2019 2:15 PM Nichole Fox  MRN:  [id number]  Chief Complaint:  Chief Complaint    Follow-up     HPI: Nichole Fox is a 59 year old African-American female on disability, lives currently in [place] with her aunt, has a history of bipolar disorder, PTSD, prediabetes, arthritis was evaluated by telemedicine today.  A video call was initiated however due to connection problem it had to be changed to a phone call.  Patient today reports she is currently not tolerating the ramelteon well.  She reports she has been struggling with sleep and when she wakes up she feels low energy and is tired during the day.  Patient otherwise reports mood symptoms are stable.  Patient reports she is compliant on the medications as prescribed and denies side effects.  She denies any suicidality, homicidality or perceptual disturbances.   Visit Diagnosis:    ICD-10-CM   1. Bipolar 1 disorder, mixed, moderate (HCC)  F31.62   2. PTSD (post-traumatic stress disorder)  F43.10 zolpidem (AMBIEN CR) 12.5 MG CR tablet    Past Psychiatric History:  I have reviewed past psychiatric history from my progress note on 04/17/2019.  Past trials of lithium, Effexor, Depakote, Ambien, Belsomra, trazodone.  Past Medical History:  Past Medical History:  Diagnosis Date  . Allergy   . Arthritis   . Bipolar disorder (HCC)   . Depression   . Goiter    on thyrodi, MD just watching  . Hyperlipidemia    diet controlled no meds  . Pre-diabetes    metformin, ozempic  . Psoriasis   . Psoriasis   . Psoriatic arthritis (HCC)   . PTSD (post-traumatic stress disorder)   . Sickle cell trait [facility])     Past Surgical History:  Procedure Laterality Date  . abortions     x2  . COLONOSCOPY     greater 10 yrs ago no polyps  . EYE SURGERY     lasik  . KNEE ARTHROSCOPY Left   . TONSILLECTOMY    . WISDOM TOOTH EXTRACTION      Family Psychiatric History: I have reviewed family psychiatric history from my progress note on 04/17/2019.  Family History:  Family History  Problem Relation Age of Onset  . Diabetes Mother   . Depression Mother   . Mental illness Mother   . Bipolar disorder Mother   . Thyroid disease Father   . Anemia Father   . Colon cancer Neg Hx   . Stomach cancer Neg Hx   . Rectal cancer Neg Hx   . Esophageal cancer Neg Hx     Social History: I have reviewed social history  from my progress note on 04/17/2019. Social History   Socioeconomic History  . Marital status: Single    Spouse name: Not on file  . Number of children: Not on file  . Years of education: Not on file  . Highest education level: Not on file  Occupational History    Comment: on disability due to mood disorder  Tobacco Use  . Smoking status: Never Smoker  . Smokeless tobacco: Never Used  Substance and Sexual Activity  . Alcohol use: Yes    Comment: socially  . Drug use: Never  . Sexual activity: Not Currently    Birth control/protection: Post-menopausal  Other Topics Concern  . Not on file  Social History Narrative  . Not on file   Social  Determinants of Health   Financial Resource Strain: Low Risk   . Difficulty of Paying Living Expenses: Not hard at all  Food Insecurity: No Food Insecurity  . Worried About Programme researcher, broadcasting/film/video in the Last Year: Never true  . Ran Out of Food in the Last Year: Never true  Transportation Needs: No Transportation Needs  . Lack of Transportation (Medical): No  . Lack of Transportation (Non-Medical): No  Physical Activity: Inactive  . Days of Exercise per Week: 0 days  . Minutes of Exercise per Session: 0 min  Stress: Stress Concern Present  . Feeling of Stress : To some extent  Social Connections: Slightly Isolated  . Frequency of Communication with Friends and Family: More than three times a week  . Frequency of Social Gatherings with Friends and Family: More than three times a week  . Attends Religious Services: More than 4 times per year  . Active Member of Clubs or Organizations: Yes  . Attends Banker Meetings: More than 4 times per year  . Marital Status: Divorced    Allergies:  Allergies  Allergen Reactions  . Erythromycin Nausea Only, Other (See Comments) and Nausea And Vomiting    Upset stomach  . Lithium Rash    Exacerbates eczema and psoriasis  . Shellfish Allergy Swelling, Rash and Nausea And Vomiting  . Strawberry Extract     Metabolic Disorder Labs: Lab Results  Component Value Date   HGBA1C 5.9 (H) 02/21/2019   No results found for: PROLACTIN Lab Results  Component Value Date   CHOL 230 (H) 02/21/2019   TRIG 132 02/21/2019   HDL 50 02/21/2019   CHOLHDL 4.6 (H) 02/21/2019   LDLCALC 156 (H) 02/21/2019   LDLCALC 171 (H) 11/18/2018   Lab Results  Component Value Date   TSH 1.120 02/21/2019   TSH 1.200 11/18/2018    Therapeutic Level Labs: No results found for: LITHIUM No results found for: VALPROATE No components found for:  CBMZ  Current Medications: Current Outpatient Medications  Medication Sig Dispense Refill  . ARIPiprazole  (ABILIFY) 15 MG tablet Take 1 tablet (15 mg total) by mouth daily with breakfast. 30 tablet 1  . betamethasone dipropionate (DIPROLENE) 0.05 % ointment Apply topically 2 (two) times daily. 45 g 6  . Calcium Carb-Cholecalciferol (CALCIUM 1000 + D PO) Take 1 tablet by mouth.    . clobetasol ointment (TEMOVATE) 0.05 % Apply 1 application topically 2 (two) times daily. 30 g 11  . fluticasone (VERAMYST) 27.5 MCG/SPRAY nasal spray Place 2 sprays into the nose daily. 10 g 11  . gabapentin (NEURONTIN) 100 MG capsule Take 1 capsule (100 mg total) by mouth at bedtime. 30 capsule 1  . metFORMIN (GLUCOPHAGE) 1000 MG  tablet     . metFORMIN (GLUCOPHAGE-XR) 500 MG 24 hr tablet Take 2 tablets (1,000 mg total) by mouth 2 (two) times daily with breakfast and lunch. 180 tablet 3  . nystatin (MYCOSTATIN/NYSTOP) powder Apply topically 2 (two) times daily. Apply to creases. Keep area dry. 15 g 1  . Semaglutide,0.25 or 0.5MG /DOS, (OZEMPIC, 0.25 OR 0.5 MG/DOSE,) 2 MG/1.5ML SOPN Inject 0.5 mg into the skin once a week. 4 pen 3  . venlafaxine XR (EFFEXOR-XR) 150 MG 24 hr capsule Take 1 capsule (150 mg total) by mouth daily with breakfast. 90 capsule 3  . zolpidem (AMBIEN CR) 12.5 MG CR tablet Take 1 tablet (12.5 mg total) by mouth at bedtime as needed for sleep. 30 tablet 0   No current facility-administered medications for this visit.     Musculoskeletal: Strength & Muscle Tone: UTA Gait & Station: Reports as WNL Patient leans: N/A  Psychiatric Specialty Exam: Review of Systems  Constitutional: Positive for fatigue.  Psychiatric/Behavioral: Positive for sleep disturbance.  All other systems reviewed and are negative.   There were no vitals taken for this visit.There is no height or weight on file to calculate BMI.  General Appearance: UTA  Eye Contact:  UTA  Speech:  Clear and Coherent  Volume:  Normal  Mood:  Euthymic  Affect:  UTA  Thought Process:  Goal Directed and Descriptions of Associations: Intact   Orientation:  Full (Time, Place, and Person)  Thought Content: Logical   Suicidal Thoughts:  No  Homicidal Thoughts:  No  Memory:  Immediate;   Fair Recent;   Fair Remote;   Fair  Judgement:  Fair  Insight:  Fair  Psychomotor Activity:  UTA  Concentration:  Concentration: Fair and Attention Span: Fair  Recall:  [employer] of Knowledge: Fair  Language: Fair  Akathisia:  No  Handed:  Right  AIMS (if indicated): UTA  Assets:  Communication Skills Desire for Improvement Housing Social Support  ADL's:  Intact  Cognition: WNL  Sleep:  Poor   Screenings: GAD-7     Office Visit from 02/21/2019 in [facility] Care at [facility]  Total GAD-7 Score  9    PHQ2-9     Office Visit from 02/21/2019 in Primary Care at [place] from 11/18/2018 in [place] at [facility] Total Score  4  0  PHQ-9 Total Score  15  --       Assessment and Plan: Nichole Fox is a 59 year old African-American female who has a history of bipolar disorder, PTSD, arthritis, prediabetic was evaluated by telemedicine today.  Patient is biologically predisposed given her history of trauma as well as health problems and family history of mental health problems.  Patient with psychosocial stressors of the current pandemic, separation from her husband and recent relocation.  She is currently struggling with sleep.  Plan as noted below.  Plan Bipolar disorder-improving Gabapentin 100 mg p.o. nightly Abilify 15 mg p.o. daily in the morning Venlafaxine as prescribed  PTSD-unstable Discontinue ramelteon for lack of benefit. Start Ambien CR 12.5 mg p.o. nightly. Continue psychotherapy sessions.  Patient will benefit from EKG to monitor her QTC since she is on medications like Abilify.  Patient agrees to get this done at her primary care office.  Follow-up in clinic in 2 to 3 weeks or sooner if needed.  February 26 at 9:40 AM  I have spent atleast 20 minutes non face to face with patient today. More than 50 % of  the time  was spent for  ordering medications and test ,psychoeducation and supportive psychotherapy and care coordination,as well as documenting clinical information in electronic health record. This note was generated in part or whole with voice recognition software. Voice recognition is usually quite accurate but there are transcription errors that can and very often do occur. I apologize for any typographical errors that were not detected and corrected.        Jomarie Longs, MD 05/28/2019, 2:15 PM

## 2019-05-29 NOTE — Telephone Encounter (Signed)
zolpidem er 12.5mg  was approved through 04-16-20

## 2019-06-04 ENCOUNTER — Telehealth (INDEPENDENT_AMBULATORY_CARE_PROVIDER_SITE_OTHER): Payer: Medicare Other | Admitting: Family Medicine

## 2019-06-04 ENCOUNTER — Encounter: Payer: Self-pay | Admitting: Family Medicine

## 2019-06-04 ENCOUNTER — Other Ambulatory Visit: Payer: Self-pay

## 2019-06-04 VITALS — BP 104/69 | HR 106 | Temp 97.5°F | Ht 65.0 in | Wt 239.0 lb

## 2019-06-04 DIAGNOSIS — R7301 Impaired fasting glucose: Secondary | ICD-10-CM | POA: Diagnosis not present

## 2019-06-04 DIAGNOSIS — Z13228 Encounter for screening for other metabolic disorders: Secondary | ICD-10-CM | POA: Diagnosis not present

## 2019-06-04 DIAGNOSIS — E785 Hyperlipidemia, unspecified: Secondary | ICD-10-CM | POA: Diagnosis not present

## 2019-06-04 DIAGNOSIS — Z5181 Encounter for therapeutic drug level monitoring: Secondary | ICD-10-CM | POA: Diagnosis not present

## 2019-06-04 LAB — POCT GLYCOSYLATED HEMOGLOBIN (HGB A1C): Hemoglobin A1C: 6.1 % — AB (ref 4.0–5.6)

## 2019-06-04 LAB — GLUCOSE, POCT (MANUAL RESULT ENTRY): POC Glucose: 116 mg/dl — AB (ref 70–99)

## 2019-06-04 MED ORDER — METFORMIN HCL 1000 MG PO TABS: 1000.0000 mg | ORAL_TABLET | Freq: Two times a day (BID) | ORAL | 1 refills | Status: DC

## 2019-06-04 NOTE — Progress Notes (Signed)
Chief Complaint  Patient presents with  . Follow-up    requests EKG for abilify per psych Dr Helyn App  . Medication Refill    rx  for 1000 metformin requested instead of 500 mg    Method of visit: in-person  HPI     Patient is taking metformin '500mg'$  XR She states that she has been only taking metformin '500mg'$  with breakfast and dinner instead of '1000mg'$  bid She states that sometimes she misses her dose of metformin She denies any side effects She is also on ozempic  Obesity Pt is more active and eating less She states that ozempic cuts her appetite She denies any fevers or chills No polyuria, polydipsia, polyphagia  Wt Readings from Last 3 Encounters:  06/04/19 239 lb (108.4 kg)  03/21/19 242 lb 12.8 oz (110.1 kg)  03/06/19 242 lb 12.8 oz (110.1 kg)   Bipolar Disorder She is on abilify and is being monitored for metabolic syndrome Her Psychiatrist needs her to get an ECG and labs  She denies any chest pains She is doing well on her dose She has high cholesterol but is eating more vegetables Lab Results  Component Value Date   CHOL 230 (H) 02/21/2019   CHOL 250 (H) 11/18/2018   Lab Results  Component Value Date   HDL 50 02/21/2019   HDL 60 11/18/2018   Lab Results  Component Value Date   LDLCALC 156 (H) 02/21/2019   LDLCALC 171 (H) 11/18/2018   Lab Results  Component Value Date   TRIG 132 02/21/2019   TRIG 94 11/18/2018   Lab Results  Component Value Date   CHOLHDL 4.6 (H) 02/21/2019   CHOLHDL 4.2 11/18/2018   No results found for: LDLDIRECT   Past Medical History:  Diagnosis Date  . Allergy   . Arthritis   . Bipolar disorder ([place])   . Depression   . Goiter    on thyrodi, MD just watching  . Hyperlipidemia    diet controlled no meds  . Pre-diabetes    metformin, ozempic  . Psoriasis   . Psoriasis   . Psoriatic arthritis ([place])   . PTSD (post-traumatic stress disorder)   . Sickle cell trait ([place])     Current Outpatient Medications    Medication Sig Dispense Refill  . ARIPiprazole (ABILIFY) 15 MG tablet Take 1 tablet (15 mg total) by mouth daily with breakfast. 30 tablet 1  . betamethasone dipropionate (DIPROLENE) 0.05 % ointment Apply topically 2 (two) times daily. 45 g 6  . Calcium Carb-Cholecalciferol (CALCIUM 1000 + D PO) Take 1 tablet by mouth.    . clobetasol ointment (TEMOVATE) 4.12 % Apply 1 application topically 2 (two) times daily. 30 g 11  . gabapentin (NEURONTIN) 100 MG capsule Take 1 capsule (100 mg total) by mouth at bedtime. 30 capsule 1  . nystatin (MYCOSTATIN/NYSTOP) powder Apply topically 2 (two) times daily. Apply to creases. Keep area dry. 15 g 1  . Semaglutide,0.25 or 0.'5MG'$ /DOS, (OZEMPIC, 0.25 OR 0.5 MG/DOSE,) 2 MG/1.5ML SOPN Inject 0.5 mg into the skin once a week. 4 pen 3  . venlafaxine XR (EFFEXOR-XR) 150 MG 24 hr capsule Take 1 capsule (150 mg total) by mouth daily with breakfast. 90 capsule 3  . zolpidem (AMBIEN CR) 12.5 MG CR tablet Take 1 tablet (12.5 mg total) by mouth at bedtime as needed for sleep. 30 tablet 0  . metFORMIN (GLUCOPHAGE) 1000 MG tablet Take 1 tablet (1,000 mg total) by mouth 2 (two) times daily with a  meal. 180 tablet 1   No current facility-administered medications for this visit.    Allergies:  Allergies  Allergen Reactions  . Erythromycin Nausea Only, Other (See Comments) and Nausea And Vomiting    Upset stomach  . Lithium Rash    Exacerbates eczema and psoriasis  . Shellfish Allergy Swelling, Rash and Nausea And Vomiting  . Strawberry Extract     Past Surgical History:  Procedure Laterality Date  . abortions     x2  . COLONOSCOPY     greater 10 yrs ago no polyps  . EYE SURGERY     lasik  . KNEE ARTHROSCOPY Left   . TONSILLECTOMY    . WISDOM TOOTH EXTRACTION      Social History   Socioeconomic History  . Marital status: Single    Spouse name: Not on file  . Number of children: Not on file  . Years of education: Not on file  . Highest education level:  Not on file  Occupational History    Comment: on disability due to mood disorder  Tobacco Use  . Smoking status: Never Smoker  . Smokeless tobacco: Never Used  Substance and Sexual Activity  . Alcohol use: Yes    Comment: socially  . Drug use: Never  . Sexual activity: Not Currently    Birth control/protection: Post-menopausal  Other Topics Concern  . Not on file  Social History Narrative  . Not on file   Social Determinants of Health   Financial Resource Strain: Low Risk   . Difficulty of Paying Living Expenses: Not hard at all  Food Insecurity: No Food Insecurity  . Worried About Charity fundraiser in the Last Year: Never true  . Ran Out of Food in the Last Year: Never true  Transportation Needs: No Transportation Needs  . Lack of Transportation (Medical): No  . Lack of Transportation (Non-Medical): No  Physical Activity: Inactive  . Days of Exercise per Week: 0 days  . Minutes of Exercise per Session: 0 min  Stress: Stress Concern Present  . Feeling of Stress : To some extent  Social Connections: Slightly Isolated  . Frequency of Communication with Friends and Family: More than three times a week  . Frequency of Social Gatherings with Friends and Family: More than three times a week  . Attends Religious Services: More than 4 times per year  . Active Member of Clubs or Organizations: Yes  . Attends Archivist Meetings: More than 4 times per year  . Marital Status: Divorced    Family History  Problem Relation Age of Onset  . Diabetes Mother   . Depression Mother   . Mental illness Mother   . Bipolar disorder Mother   . Thyroid disease Father   . Anemia Father   . Colon cancer Neg Hx   . Stomach cancer Neg Hx   . Rectal cancer Neg Hx   . Esophageal cancer Neg Hx      ROS Review of Systems See HPI Constitution: No fevers or chills No malaise No diaphoresis Skin: No rash or itching Eyes: no blurry vision, no double vision GU: no dysuria or  hematuria Neuro: no dizziness or headaches all others reviewed and negative   Objective: Vitals:   06/04/19 1522  BP: 104/69  Pulse: (!) 106  Temp: (!) 97.5 F (36.4 C)  SpO2: 100%  Weight: 239 lb (108.4 kg)  Height: '5\' 5"'$  (1.651 m)    Physical Exam Physical Exam  Constitutional:  Oriented to person, place, and time. Appears well-developed and well-nourished.  HENT:  Head: Normocephalic and atraumatic.  Eyes: Conjunctivae and EOM are normal.  Cardiovascular: Normal rate, regular rhythm, normal heart sounds and intact distal pulses.  No murmur heard. Pulmonary/Chest: Effort normal and breath sounds normal. No stridor. No respiratory distress. Has no wheezes.  Neurological: Is alert and oriented to person, place, and time.  Skin: Skin is warm. Capillary refill takes less than 2 seconds.  Psychiatric: Has a normal mood and affect. Behavior is normal. Judgment and thought content normal.   ECG - NSR  Assessment and Plan Ixchel was seen today for follow-up and medication refill.  Diagnoses and all orders for this visit:  Dyslipidemia- reviewed last reading Pt to follow up in 3 months -     Lipid panel -     EKG 12-Lead  Encounter for screening for other metabolic disorders -     [id number]  Morbid obesity ([place]) - improving on ozempic -     POCT glucose (manual entry) -     EKG 12-Lead  Impaired fasting glucose- pt prediabetic Continue metformin but at the intended dose of '1000mg'$  bid -     POCT glycosylated hemoglobin (Hb A1C) -     POCT glucose (manual entry)  Encounter for medication monitoring -     EKG 12-Lead  Other orders -     metFORMIN (GLUCOPHAGE) 1000 MG tablet; Take 1 tablet (1,000 mg total) by mouth 2 (two) times daily with a meal.     Kabrina Christiano A Jaunice Mirza

## 2019-06-04 NOTE — Progress Notes (Signed)
Requests EKG per psych dr request for monitoring while taking Abilify. Request a prescription for metformin 1000 instead of 500 mg due to forgetting to take second pill often.  Patient was advised to come in for o/v appt EKG done , labs Vitals - 104/69, p- 106, O2 sat, 100 %, 239 lb, t- 97.5

## 2019-06-05 LAB — CMP14+EGFR
ALT: 44 IU/L — ABNORMAL HIGH (ref 0–32)
AST: 29 IU/L (ref 0–40)
Albumin/Globulin Ratio: 1.8 (ref 1.2–2.2)
Albumin: 4.4 g/dL (ref 3.8–4.9)
Alkaline Phosphatase: 114 IU/L (ref 39–117)
BUN/Creatinine Ratio: 13 (ref 9–23)
BUN: 8 mg/dL (ref 6–24)
Bilirubin Total: 0.2 mg/dL (ref 0.0–1.2)
CO2: 24 mmol/L (ref 20–29)
Calcium: 9.8 mg/dL (ref 8.7–10.2)
Chloride: 104 mmol/L (ref 96–106)
Creatinine, Ser: 0.63 mg/dL (ref 0.57–1.00)
GFR calc Af Amer: 115 mL/min/{1.73_m2} (ref >59)
GFR calc non Af Amer: 100 mL/min/{1.73_m2} (ref >59)
Globulin, Total: 2.4 g/dL (ref 1.5–4.5)
Glucose: 106 mg/dL — ABNORMAL HIGH (ref 65–99)
Potassium: 3.8 mmol/L (ref 3.5–5.2)
Sodium: 143 mmol/L (ref 134–144)
Total Protein: 6.8 g/dL (ref 6.0–8.5)

## 2019-06-05 LAB — LIPID PANEL
Chol/HDL Ratio: 3.8 ratio (ref 0.0–4.4)
Cholesterol, Total: 194 mg/dL (ref 100–199)
HDL: 51 mg/dL (ref >39)
LDL Chol Calc (NIH): 119 mg/dL — ABNORMAL HIGH (ref 0–99)
Triglycerides: 134 mg/dL (ref 0–149)
VLDL Cholesterol Cal: 24 mg/dL (ref 5–40)

## 2019-06-08 ENCOUNTER — Other Ambulatory Visit: Payer: Self-pay | Admitting: Psychiatry

## 2019-06-08 DIAGNOSIS — F4310 Post-traumatic stress disorder, unspecified: Secondary | ICD-10-CM

## 2019-06-08 DIAGNOSIS — F3162 Bipolar disorder, current episode mixed, moderate: Secondary | ICD-10-CM

## 2019-06-11 ENCOUNTER — Other Ambulatory Visit: Payer: Self-pay | Admitting: Family Medicine

## 2019-06-11 DIAGNOSIS — F3170 Bipolar disorder, currently in remission, most recent episode unspecified: Secondary | ICD-10-CM

## 2019-06-13 ENCOUNTER — Other Ambulatory Visit: Payer: Self-pay

## 2019-06-13 ENCOUNTER — Ambulatory Visit (INDEPENDENT_AMBULATORY_CARE_PROVIDER_SITE_OTHER): Payer: Medicare Other | Admitting: Psychiatry

## 2019-06-13 ENCOUNTER — Encounter: Payer: Self-pay | Admitting: Psychiatry

## 2019-06-13 DIAGNOSIS — F3162 Bipolar disorder, current episode mixed, moderate: Secondary | ICD-10-CM

## 2019-06-13 DIAGNOSIS — F4310 Post-traumatic stress disorder, unspecified: Secondary | ICD-10-CM

## 2019-06-13 MED ORDER — HYDROXYZINE HCL 25 MG PO TABS: 12.5000 mg | ORAL_TABLET | Freq: Two times a day (BID) | ORAL | 1 refills | Status: DC | PRN

## 2019-06-13 MED ORDER — ZOLPIDEM TARTRATE ER 12.5 MG PO TBCR: 12.5000 mg | EXTENDED_RELEASE_TABLET | Freq: Every evening | ORAL | 1 refills | Status: DC | PRN

## 2019-06-13 NOTE — Progress Notes (Signed)
Provider Location : [facility] Patient Location : Home   Virtual Visit via Telephone Note  I connected with Nichole Fox on 06/13/19 at  9:40 AM EST by telephone and verified that I am speaking with the correct person using two identifiers.   I discussed the limitations, risks, security and privacy concerns of performing an evaluation and management service by telephone and the availability of in person appointments. I also discussed with the patient that there may be a patient responsible charge related to this service. The patient expressed understanding and agreed to proceed.   I discussed the assessment and treatment plan with the patient. The patient was provided an opportunity to ask questions and all were answered. The patient agreed with the plan and demonstrated an understanding of the instructions.   The patient was advised to call back or seek an in-person evaluation if the symptoms worsen or if the condition fails to improve as anticipated.   BH MD OP Progress Note  06/13/2019 12:31 PM Nichole Fox  MRN:  [id number]  Chief Complaint:  Chief Complaint    Follow-up     HPI: Nichole Fox is a 59 year old African-American female on disability, lives currently in [place] with her aunt, has a history of bipolar disorder, PTSD, prediabetes, arthritis was evaluated by phone today.  Patient preferred to do a phone call.  Patient today reports her sleep has improved on the Ambien CR.  She is able to sleep through the night.  Patient reports she however is anxious about the pandemic.  She has been unmotivated to go anywhere because she is fearful of the situation.  She reports she was planning to go to the gym however they had to shut down the gym since someone got infected with COVID-19 there.  She reports even though they re-opened she is currently anxious about going back there.  She reports this is hence affecting her and she does not know what to do about her  anxiety.  She reports she is currently attending a group with [employer] which has been beneficial.  She continues to wait for psychotherapy sessions and has been unable to find a therapist.  She reports she was scheduled to see a therapist however she did not take Medicare and hence she had to cancel that appointment.  Patient denies any suicidality, homicidality or perceptual disturbances.  Patient denies any other concerns today.  Visit Diagnosis:    ICD-10-CM   1. Bipolar 1 disorder, mixed, moderate (HCC)  F31.62 hydrOXYzine (ATARAX/VISTARIL) 25 MG tablet  2. PTSD (post-traumatic stress disorder)  F43.10 zolpidem (AMBIEN CR) 12.5 MG CR tablet    Past Psychiatric History: I have reviewed past psychiatric history from my progress note on 04/17/2019.  Past trials of lithium, Effexor, Depakote, Ambien, Belsomra, trazodone  Past Medical History:  Past Medical History:  Diagnosis Date  . Allergy   . Arthritis   . Bipolar disorder ([facility])   . Depression   . Goiter    on thyrodi, MD just watching  . Hyperlipidemia    diet controlled no meds  . Pre-diabetes    metformin, ozempic  . Psoriasis   . Psoriasis   . Psoriatic arthritis ([facility])   . PTSD (post-traumatic stress disorder)   . Sickle cell trait [facility])     Past Surgical History:  Procedure Laterality Date  . abortions     x2  . COLONOSCOPY     greater 10 yrs ago no polyps  . EYE SURGERY  lasik  . KNEE ARTHROSCOPY Left   . TONSILLECTOMY    . WISDOM TOOTH EXTRACTION      Family Psychiatric History: I have reviewed family psychiatric history from my progress note on 04/17/2019.  Family History:  Family History  Problem Relation Age of Onset  . Diabetes Mother   . Depression Mother   . Mental illness Mother   . Bipolar disorder Mother   . Thyroid disease Father   . Anemia Father   . Colon cancer Neg Hx   . Stomach cancer Neg Hx   . Rectal cancer Neg Hx   . Esophageal cancer Neg Hx     Social History:  Reviewed social history from my progress note on 04/17/2019. Social History   Socioeconomic History  . Marital status: Single    Spouse name: Not on file  . Number of children: Not on file  . Years of education: Not on file  . Highest education level: Not on file  Occupational History    Comment: on disability due to mood disorder  Tobacco Use  . Smoking status: Never Smoker  . Smokeless tobacco: Never Used  Substance and Sexual Activity  . Alcohol use: Yes    Comment: socially  . Drug use: Never  . Sexual activity: Not Currently    Birth control/protection: Post-menopausal  Other Topics Concern  . Not on file  Social History Narrative  . Not on file   Social Determinants of Health   Financial Resource Strain: Low Risk   . Difficulty of Paying Living Expenses: Not hard at all  Food Insecurity: No Food Insecurity  . Worried About Charity fundraiser in the Last Year: Never true  . Ran Out of Food in the Last Year: Never true  Transportation Needs: No Transportation Needs  . Lack of Transportation (Medical): No  . Lack of Transportation (Non-Medical): No  Physical Activity: Inactive  . Days of Exercise per Week: 0 days  . Minutes of Exercise per Session: 0 min  Stress: Stress Concern Present  . Feeling of Stress : To some extent  Social Connections: Slightly Isolated  . Frequency of Communication with Friends and Family: More than three times a week  . Frequency of Social Gatherings with Friends and Family: More than three times a week  . Attends Religious Services: More than 4 times per year  . Active Member of Clubs or Organizations: Yes  . Attends Archivist Meetings: More than 4 times per year  . Marital Status: Divorced    Allergies:  Allergies  Allergen Reactions  . Erythromycin Nausea Only, Other (See Comments) and Nausea And Vomiting    Upset stomach  . Lithium Rash    Exacerbates eczema and psoriasis  . Shellfish Allergy Swelling, Rash and  Nausea And Vomiting  . Strawberry Extract     Metabolic Disorder Labs: Lab Results  Component Value Date   HGBA1C 6.1 (A) 06/04/2019   No results found for: PROLACTIN Lab Results  Component Value Date   CHOL 194 06/04/2019   TRIG 134 06/04/2019   HDL 51 06/04/2019   CHOLHDL 3.8 06/04/2019   LDLCALC 119 (H) 06/04/2019   LDLCALC 156 (H) 02/21/2019   Lab Results  Component Value Date   TSH 1.120 02/21/2019   TSH 1.200 11/18/2018    Therapeutic Level Labs: No results found for: LITHIUM No results found for: VALPROATE No components found for:  CBMZ  Current Medications: Current Outpatient Medications  Medication Sig Dispense Refill  .  ARIPiprazole (ABILIFY) 15 MG tablet TAKE 1 TABLET(15 MG) BY MOUTH DAILY WITH BREAKFAST 30 tablet 1  . betamethasone dipropionate (DIPROLENE) 0.05 % ointment Apply topically 2 (two) times daily. 45 g 6  . Calcium Carb-Cholecalciferol (CALCIUM 1000 + D PO) Take 1 tablet by mouth.    . clobetasol ointment (TEMOVATE) 0.05 % Apply 1 application topically 2 (two) times daily. 30 g 11  . gabapentin (NEURONTIN) 100 MG capsule TAKE 1 CAPSULE(100 MG) BY MOUTH AT BEDTIME 30 capsule 1  . hydrOXYzine (ATARAX/VISTARIL) 25 MG tablet Take 0.5-1 tablets (12.5-25 mg total) by mouth 2 (two) times daily as needed. Severe anxiety attacks only 60 tablet 1  . metFORMIN (GLUCOPHAGE) 1000 MG tablet Take 1 tablet (1,000 mg total) by mouth 2 (two) times daily with a meal. 180 tablet 1  . nystatin (MYCOSTATIN/NYSTOP) powder Apply topically 2 (two) times daily. Apply to creases. Keep area dry. 15 g 1  . Semaglutide,0.25 or 0.5MG /DOS, (OZEMPIC, 0.25 OR 0.5 MG/DOSE,) 2 MG/1.5ML SOPN Inject 0.5 mg into the skin once a week. 4 pen 3  . venlafaxine XR (EFFEXOR-XR) 150 MG 24 hr capsule Take 1 capsule (150 mg total) by mouth daily with breakfast. 90 capsule 3  . [START ON 07/02/2019] zolpidem (AMBIEN CR) 12.5 MG CR tablet Take 1 tablet (12.5 mg total) by mouth at bedtime as needed for  sleep. 30 tablet 1   No current facility-administered medications for this visit.     Musculoskeletal: Strength & Muscle Tone: UTA Gait & Station: UTA Patient leans: N/A  Psychiatric Specialty Exam: Review of Systems  Psychiatric/Behavioral: The patient is nervous/anxious.   All other systems reviewed and are negative.   There were no vitals taken for this visit.There is no height or weight on file to calculate BMI.  General Appearance: UTA  Eye Contact:  UTA  Speech:  Clear and Coherent  Volume:  Normal  Mood:  Anxious  Affect:  UTA  Thought Process:  Goal Directed and Descriptions of Associations: Intact  Orientation:  Full (Time, Place, and Person)  Thought Content: Logical   Suicidal Thoughts:  No  Homicidal Thoughts:  No  Memory:  Immediate;   Fair Recent;   Fair Remote;   Fair  Judgement:  Fair  Insight:  Fair  Psychomotor Activity:  UTA  Concentration:  Concentration: Fair and Attention Span: Fair  Recall:  [employer] of Knowledge: Fair  Language: Fair  Akathisia:  No  Handed:  Right  AIMS (if indicated): UTA  Assets:  Communication Skills Desire for Improvement Housing Transportation  ADL's:  Intact  Cognition: WNL  Sleep:  Fair   Screenings: GAD-7     Office Visit from 02/21/2019 in Primary Care at [facility]  Total GAD-7 Score  9    PHQ2-9     Telemedicine from 06/04/2019 in Primary Care at [facility] Visit from 02/21/2019 in [facility] Care at [facility] Visit from 11/18/2018 in Primary Care at [facility] Total Score  0  4  0  PHQ-9 Total Score  -  15  -       Assessment and Plan: Yamilet is a 59 year old African-American female who has a history of bipolar disorder, PTSD, arthritis, prediabetic was evaluated by phone today.  Patient is biologically predisposed given her history of trauma as well as health problems and family history of mental health problems.  Patient with psychosocial stressors of the current pandemic is currently struggling with  anxiety symptoms.  Plan as noted below.  Plan Bipolar disorder-improving Gabapentin 100 mg p.o. nightly Abilify 15 mg p.o. daily in the morning Venlafaxine 150 mg p.o. daily  PTSD-some progress Ambien CR 12.5 mg p.o. nightly Start hydroxyzine 12.5 to 25 mg p.o. twice daily as needed for anxiety attacks. Provided supportive psychotherapy, discussed techniques to manage her anxiety symptoms. Will refer patient for psychotherapy sessions again, I have sent a message to [facility].  I have reviewed EKG in E [facility] dated 06/04/2019-sinus rhythm, QTC within normal limits.  Follow-up in clinic in 3 to 4 weeks or sooner if needed.  I have spent atleast 20 minutes non face to face with patient today. More than 50 % of the time was spent for  ordering medications and test ,psychoeducation and supportive psychotherapy and care coordination,as well as documenting clinical information in electronic health record,interpreting results of test and communication of results This note was generated in part or whole with voice recognition software. Voice recognition is usually quite accurate but there are transcription errors that can and very often do occur. I apologize for any typographical errors that were not detected and corrected.        Jomarie Longs, MD 06/13/2019, 12:31 PM

## 2019-06-17 ENCOUNTER — Telehealth: Payer: Self-pay

## 2019-06-17 NOTE — Telephone Encounter (Signed)
Patient called requesting a dosage increase on her Aripiprazole 15mg . She uses [facility] on [address] in [place]. Thank you.

## 2019-06-17 NOTE — Telephone Encounter (Signed)
Abilify was increased to 15 mg during her appointment with me recently and it was already sent to her pharmacy that day. Please ask her to contact her pharmacy.

## 2019-06-18 ENCOUNTER — Ambulatory Visit (INDEPENDENT_AMBULATORY_CARE_PROVIDER_SITE_OTHER): Payer: Medicare Other | Admitting: Licensed Clinical Social Worker

## 2019-06-18 ENCOUNTER — Encounter: Payer: Self-pay | Admitting: Licensed Clinical Social Worker

## 2019-06-18 ENCOUNTER — Other Ambulatory Visit: Payer: Self-pay

## 2019-06-18 DIAGNOSIS — F4310 Post-traumatic stress disorder, unspecified: Secondary | ICD-10-CM
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
## 2019-06-23 NOTE — Telephone Encounter (Signed)
Notified patient.

## 2019-06-27 ENCOUNTER — Telehealth: Payer: Self-pay

## 2019-06-27 NOTE — Telephone Encounter (Signed)
Pt called to get info on vaccine, lm

## 2019-06-27 NOTE — Telephone Encounter (Signed)
Patient had concerns about chronic conditions and taking the covid vaccine. Pt was told that based on [email or web address] guidelines she is encouraged to take the vaccine.

## 2019-07-03 ENCOUNTER — Other Ambulatory Visit: Payer: Self-pay

## 2019-07-03 ENCOUNTER — Ambulatory Visit (INDEPENDENT_AMBULATORY_CARE_PROVIDER_SITE_OTHER): Payer: Medicare Other | Admitting: Psychiatry

## 2019-07-03 DIAGNOSIS — F3162 Bipolar disorder, current episode mixed, moderate: Secondary | ICD-10-CM | POA: Diagnosis not present

## 2019-07-03 DIAGNOSIS — F4310 Post-traumatic stress disorder, unspecified: Secondary | ICD-10-CM

## 2019-07-04 ENCOUNTER — Encounter (HOSPITAL_COMMUNITY): Payer: Self-pay | Admitting: Psychiatry
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
## 2019-07-11 ENCOUNTER — Encounter: Payer: Self-pay | Admitting: Psychiatry

## 2019-07-11 ENCOUNTER — Other Ambulatory Visit: Payer: Self-pay

## 2019-07-11 ENCOUNTER — Ambulatory Visit (INDEPENDENT_AMBULATORY_CARE_PROVIDER_SITE_OTHER): Payer: Medicare Other | Admitting: Psychiatry

## 2019-07-11 DIAGNOSIS — F3178 Bipolar disorder, in full remission, most recent episode mixed: Secondary | ICD-10-CM

## 2019-07-11 DIAGNOSIS — F3162 Bipolar disorder, current episode mixed, moderate: Secondary | ICD-10-CM

## 2019-07-11 DIAGNOSIS — F4310 Post-traumatic stress disorder, unspecified: Secondary | ICD-10-CM | POA: Diagnosis not present

## 2019-07-11 MED ORDER — GABAPENTIN 100 MG PO CAPS: ORAL_CAPSULE | ORAL | 1 refills | Status: DC

## 2019-07-11 MED ORDER — ZOLPIDEM TARTRATE ER 12.5 MG PO TBCR: 12.5000 mg | EXTENDED_RELEASE_TABLET | Freq: Every evening | ORAL | 1 refills | Status: DC | PRN

## 2019-07-11 MED ORDER — ARIPIPRAZOLE 15 MG PO TABS: ORAL_TABLET | ORAL | 1 refills | Status: DC

## 2019-07-11 NOTE — Progress Notes (Signed)
Provider Location : [facility] Patient Location : Home  Virtual Visit via Video Note  I connected with Nichole Fox on 07/11/19 at  9:40 AM EDT by a video enabled telemedicine application and verified that I am speaking with the correct person using two identifiers.   I discussed the limitations of evaluation and management by telemedicine and the availability of in person appointments. The patient expressed understanding and agreed to proceed.   I discussed the assessment and treatment plan with the patient. The patient was provided an opportunity to ask questions and all were answered. The patient agreed with the plan and demonstrated an understanding of the instructions.   The patient was advised to call back or seek an in-person evaluation if the symptoms worsen or if the condition fails to improve as anticipated.  BH MD OP Progress Note  07/11/2019 10:02 AM Nichole Fox  MRN:  [id number]  Chief Complaint:  Chief Complaint    Follow-up     HPI: Nichole Fox is a 59 year old African-American female on disability, lives currently in [place] with her aunt, has a history of bipolar disorder, PTSD, prediabetic ,arthritis was evaluated by telemedicine today.  Patient today reports she is currently doing well with regards to her mood.  Patient denies any significant irritability, anxiety or depressive symptoms.  She reports she is compliant on the Abilify and venlafaxine as prescribed.  She denies side effects.  She reports sleep is improved on the Ambien.  She reports she continues to have dreams however she does not remember her dreams and she is able to fall back asleep.  She reports she is not frustrated by her dreams at this time and they do not scare her.  Patient reports she has started psychotherapy sessions with Ms. Peggy Bynum.  She had her first intake evaluation which went well.  Patient denies any suicidality, homicidality or perceptual disturbances.  Patient  denies any other concerns today. Visit Diagnosis:    ICD-10-CM   1. Bipolar disorder, in full remission, most recent episode mixed (HCC)  F31.78 ARIPiprazole (ABILIFY) 15 MG tablet  2. PTSD (post-traumatic stress disorder)  F43.10 ARIPiprazole (ABILIFY) 15 MG tablet    zolpidem (AMBIEN CR) 12.5 MG CR tablet    Past Psychiatric History: I have reviewed past psychiatric history from my progress note on 04/17/2019.  Past trials of lithium, Effexor, Depakote, Ambien, Belsomra, trazodone.  Past Medical History:  Past Medical History:  Diagnosis Date  . Allergy   . Arthritis   . Bipolar disorder (HCC)   . Depression   . Goiter    on thyrodi, MD just watching  . Hyperlipidemia    diet controlled no meds  . Pre-diabetes    metformin, ozempic  . Psoriasis   . Psoriasis   . Psoriatic arthritis (HCC)   . PTSD (post-traumatic stress disorder)   . Sickle cell trait [facility])     Past Surgical History:  Procedure Laterality Date  . abortions     x2  . COLONOSCOPY     greater 10 yrs ago no polyps  . EYE SURGERY     lasik  . KNEE ARTHROSCOPY Left   . TONSILLECTOMY    . WISDOM TOOTH EXTRACTION      Family Psychiatric History: I have reviewed family psychiatric history from my progress note on 04/17/2019.  Family History:  Family History  Problem Relation Age of Onset  . Diabetes Mother   . Depression Mother   . Mental illness Mother   .  Bipolar disorder Mother   . Thyroid disease Father   . Anemia Father   . Colon cancer Neg Hx   . Stomach cancer Neg Hx   . Rectal cancer Neg Hx   . Esophageal cancer Neg Hx     Social History: Reviewed social history from my progress note on 04/17/2019. Social History   Socioeconomic History  . Marital status: Single    Spouse name: Not on file  . Number of children: Not on file  . Years of education: Not on file  . Highest education level: Not on file  Occupational History    Comment: on disability due to mood disorder  Tobacco Use   . Smoking status: Never Smoker  . Smokeless tobacco: Never Used  Substance and Sexual Activity  . Alcohol use: Yes    Comment: socially  . Drug use: Never  . Sexual activity: Not Currently    Birth control/protection: Post-menopausal  Other Topics Concern  . Not on file  Social History Narrative  . Not on file   Social Determinants of Health   Financial Resource Strain: Low Risk   . Difficulty of Paying Living Expenses: Not hard at all  Food Insecurity: No Food Insecurity  . Worried About Charity fundraiser in the Last Year: Never true  . Ran Out of Food in the Last Year: Never true  Transportation Needs: No Transportation Needs  . Lack of Transportation (Medical): No  . Lack of Transportation (Non-Medical): No  Physical Activity: Inactive  . Days of Exercise per Week: 0 days  . Minutes of Exercise per Session: 0 min  Stress: Stress Concern Present  . Feeling of Stress : To some extent  Social Connections: Slightly Isolated  . Frequency of Communication with Friends and Family: More than three times a week  . Frequency of Social Gatherings with Friends and Family: More than three times a week  . Attends Religious Services: More than 4 times per year  . Active Member of Clubs or Organizations: Yes  . Attends Archivist Meetings: More than 4 times per year  . Marital Status: Divorced    Allergies:  Allergies  Allergen Reactions  . Erythromycin Nausea Only, Other (See Comments) and Nausea And Vomiting    Upset stomach  . Lithium Rash    Exacerbates eczema and psoriasis  . Shellfish Allergy Swelling, Rash and Nausea And Vomiting  . Strawberry Extract     Metabolic Disorder Labs: Lab Results  Component Value Date   HGBA1C 6.1 (A) 06/04/2019   No results found for: PROLACTIN Lab Results  Component Value Date   CHOL 194 06/04/2019   TRIG 134 06/04/2019   HDL 51 06/04/2019   CHOLHDL 3.8 06/04/2019   LDLCALC 119 (H) 06/04/2019   LDLCALC 156 (H)  02/21/2019   Lab Results  Component Value Date   TSH 1.120 02/21/2019   TSH 1.200 11/18/2018    Therapeutic Level Labs: No results found for: LITHIUM No results found for: VALPROATE No components found for:  CBMZ  Current Medications: Current Outpatient Medications  Medication Sig Dispense Refill  . ARIPiprazole (ABILIFY) 15 MG tablet TAKE 1 TABLET(15 MG) BY MOUTH DAILY WITH BREAKFAST 30 tablet 1  . betamethasone dipropionate (DIPROLENE) 0.05 % ointment Apply topically 2 (two) times daily. 45 g 6  . Calcium Carb-Cholecalciferol (CALCIUM 1000 + D PO) Take 1 tablet by mouth.    . clobetasol ointment (TEMOVATE) 3.23 % Apply 1 application topically 2 (two) times daily. (  Patient not taking: Reported on 07/03/2019) 30 g 11  . gabapentin (NEURONTIN) 100 MG capsule TAKE 1 CAPSULE(100 MG) BY MOUTH AT BEDTIME 30 capsule 1  . hydrOXYzine (ATARAX/VISTARIL) 25 MG tablet Take 0.5-1 tablets (12.5-25 mg total) by mouth 2 (two) times daily as needed. Severe anxiety attacks only (Patient not taking: Reported on 07/03/2019) 60 tablet 1  . metFORMIN (GLUCOPHAGE) 1000 MG tablet Take 1 tablet (1,000 mg total) by mouth 2 (two) times daily with a meal. 180 tablet 1  . nystatin (MYCOSTATIN/NYSTOP) powder Apply topically 2 (two) times daily. Apply to creases. Keep area dry. (Patient not taking: Reported on 07/03/2019) 15 g 1  . Semaglutide,0.25 or 0.5MG /DOS, (OZEMPIC, 0.25 OR 0.5 MG/DOSE,) 2 MG/1.5ML SOPN Inject 0.5 mg into the skin once a week. 4 pen 3  . venlafaxine XR (EFFEXOR-XR) 150 MG 24 hr capsule Take 1 capsule (150 mg total) by mouth daily with breakfast. 90 capsule 3  . zolpidem (AMBIEN CR) 12.5 MG CR tablet Take 1 tablet (12.5 mg total) by mouth at bedtime as needed for sleep. 30 tablet 1   No current facility-administered medications for this visit.     Musculoskeletal: Strength & Muscle Tone: UTA Gait & Station: normal Patient leans: N/A  Psychiatric Specialty Exam: Review of Systems   Psychiatric/Behavioral: Positive for sleep disturbance (Improving).  All other systems reviewed and are negative.   There were no vitals taken for this visit.There is no height or weight on file to calculate BMI.  General Appearance: Casual  Eye Contact:  Fair  Speech:  Clear and Coherent  Volume:  Normal  Mood:  Euthymic  Affect:  Congruent  Thought Process:  Goal Directed and Descriptions of Associations: Intact  Orientation:  Full (Time, Place, and Person)  Thought Content: Logical   Suicidal Thoughts:  No  Homicidal Thoughts:  No  Memory:  Immediate;   Fair Recent;   Fair Remote;   Fair  Judgement:  Fair  Insight:  Fair  Psychomotor Activity:  Normal  Concentration:  Concentration: Fair and Attention Span: Fair  Recall:  [employer] of Knowledge: Fair  Language: Fair  Akathisia:  No  Handed:  Right  AIMS (if indicated): UTA  Assets:  Communication Skills Desire for Improvement Housing Social Support  ADL's:  Intact  Cognition: WNL  Sleep:  Improving   Screenings: GAD-7     Office Visit from 02/21/2019 in Primary Care at [place]  Total GAD-7 Score  9    PHQ2-9     Telemedicine from 06/04/2019 in [facility] Visit from 02/21/2019 in [facility] Care at [facility] Visit from 11/18/2018 in Primary Care at [facility] Total Score  0  4  0  PHQ-9 Total Score  --  15  --       Assessment and Plan: Taunja is a 59 year old African-American female who has a history of bipolar disorder, PTSD, arthritis, prediabetic was evaluated by telemedicine today.  She is biologically predisposed given her history of trauma as well as health problems and family history of mental health problems.  Patient with psychosocial stressors of the current pandemic is currently making progress.  Plan as noted below.  Plan Bipolar disorder in remission Gabapentin 100 mg p.o. nightly Abilify 15 mg p.o. daily in the morning Venlafaxine 150 mg p.o. daily  PTSD-improving Ambien  CR 12.5 mg p.o. nightly Hydroxyzine 12.5 to 25 mg p.o. twice daily as needed for anxiety attacks Continue psychotherapy sessions with Ms. Peggy  Bynum.  I have reviewed medical records in [facility] per Ms. Peggy Bynum dated 07/03/2019-individual therapy recommended 1 time every 1- 4 weeks.'  Follow-up in clinic in 6 weeks or sooner if needed.  I have spent atleast 20 minutes non face to face with patient today. More than 50 % of the time was spent for preparing to see the patient ( e.g., review of test, records ), obtaining and to review and separately obtained history , ordering medications and test ,psychoeducation and supportive psychotherapy and care coordination,as well as documenting clinical information in electronic health record. This note was generated in part or whole with voice recognition software. Voice recognition is usually quite accurate but there are transcription errors that can and very often do occur. I apologize for any typographical errors that were not detected and corrected.        Jomarie Longs, MD 07/11/2019, 10:02 AM

## 2019-07-24 ENCOUNTER — Telehealth: Payer: Self-pay | Admitting: Family Medicine

## 2019-07-24 NOTE — Telephone Encounter (Signed)
Called patient  LVM for pt to call and schedulle 3 monrth follow up for med conditions with another provider other than Creta Levin so there no lapse in her care

## 2019-08-05 ENCOUNTER — Other Ambulatory Visit: Payer: Self-pay | Admitting: Psychiatry

## 2019-08-05 DIAGNOSIS — F3178 Bipolar disorder, in full remission, most recent episode mixed: Secondary | ICD-10-CM

## 2019-08-05 DIAGNOSIS — F4310 Post-traumatic stress disorder, unspecified: Secondary | ICD-10-CM

## 2019-08-06 ENCOUNTER — Other Ambulatory Visit: Payer: Self-pay | Admitting: Psychiatry

## 2019-08-06 DIAGNOSIS — F4310 Post-traumatic stress disorder, unspecified: Secondary | ICD-10-CM

## 2019-08-06 DIAGNOSIS — F3162 Bipolar disorder, current episode mixed, moderate: Secondary | ICD-10-CM

## 2019-08-14 ENCOUNTER — Ambulatory Visit (HOSPITAL_COMMUNITY): Payer: Medicare Other | Admitting: Psychiatry

## 2019-08-19 ENCOUNTER — Telehealth (INDEPENDENT_AMBULATORY_CARE_PROVIDER_SITE_OTHER): Payer: Medicare Other | Admitting: Psychiatry

## 2019-08-19 ENCOUNTER — Encounter: Payer: Self-pay | Admitting: Psychiatry

## 2019-08-19 ENCOUNTER — Other Ambulatory Visit: Payer: Self-pay

## 2019-08-19 ENCOUNTER — Ambulatory Visit: Payer: Medicare Other | Admitting: Psychiatry

## 2019-08-19 DIAGNOSIS — F3178 Bipolar disorder, in full remission, most recent episode mixed: Secondary | ICD-10-CM | POA: Diagnosis not present

## 2019-08-19 DIAGNOSIS — F4310 Post-traumatic stress disorder, unspecified: Secondary | ICD-10-CM

## 2019-08-19 MED ORDER — GABAPENTIN 100 MG PO CAPS: ORAL_CAPSULE | ORAL | 0 refills | Status: DC

## 2019-08-19 MED ORDER — ARIPIPRAZOLE 15 MG PO TABS: ORAL_TABLET | ORAL | 0 refills | Status: DC

## 2019-08-19 NOTE — Progress Notes (Signed)
Provider Location : [facility] Patient Location : Home  Virtual Visit via Video Note  I connected with Nichole Fox on 08/19/19 at 10:40 AM EDT by a video enabled telemedicine application and verified that I am speaking with the correct person using two identifiers.   I discussed the limitations of evaluation and management by telemedicine and the availability of in person appointments. The patient expressed understanding and agreed to proceed.   I discussed the assessment and treatment plan with the patient. The patient was provided an opportunity to ask questions and all were answered. The patient agreed with the plan and demonstrated an understanding of the instructions.   The patient was advised to call back or seek an in-person evaluation if the symptoms worsen or if the condition fails to improve as anticipated.   BH MD OP Progress Note  08/19/2019 12:21 PM Nichole Fox  MRN:  [id number]  Chief Complaint:  Chief Complaint    Follow-up     HPI: Nichole Fox is a 59 year old African-American female on disability, lives currently in [place] with her aunt, has a history of bipolar disorder, PTSD, prediabetic, arthritis was evaluated by telemedicine today.  Patient today reports she is currently doing well with regards to her mood symptoms.  She denies any mood swings.  She denies any perceptual disturbances.  She reports sleep as good.  She reports appetite is fair.  She is compliant on medications as prescribed.  She denies side effects.  She reports she is looking forward to her appointment with her therapist.  Her previous appointment was rescheduled however she has upcoming appointment scheduled.  Patient denies any other concerns today. Visit Diagnosis:    ICD-10-CM   1. Bipolar disorder, in full remission, most recent episode mixed (HCC)  F31.78 gabapentin (NEURONTIN) 100 MG capsule  2. PTSD (post-traumatic stress disorder)  F43.10 ARIPiprazole (ABILIFY) 15  MG tablet    gabapentin (NEURONTIN) 100 MG capsule    Past Psychiatric History: I have reviewed past psychiatric history from my progress note on 04/17/2019.  Past trials of lithium, Effexor, Depakote, Ambien, Belsomra, trazodone.  Past Medical History:  Past Medical History:  Diagnosis Date  . Allergy   . Arthritis   . Bipolar disorder (HCC)   . Depression   . Goiter    on thyrodi, MD just watching  . Hyperlipidemia    diet controlled no meds  . Pre-diabetes    metformin, ozempic  . Psoriasis   . Psoriasis   . Psoriatic arthritis (HCC)   . PTSD (post-traumatic stress disorder)   . Sickle cell trait [facility])     Past Surgical History:  Procedure Laterality Date  . abortions     x2  . COLONOSCOPY     greater 10 yrs ago no polyps  . EYE SURGERY     lasik  . KNEE ARTHROSCOPY Left   . TONSILLECTOMY    . WISDOM TOOTH EXTRACTION      Family Psychiatric History: I have reviewed family psychiatric history from my progress note on 04/17/2019.  Family History:  Family History  Problem Relation Age of Onset  . Diabetes Mother   . Depression Mother   . Mental illness Mother   . Bipolar disorder Mother   . Thyroid disease Father   . Anemia Father   . Colon cancer Neg Hx   . Stomach cancer Neg Hx   . Rectal cancer Neg Hx   . Esophageal cancer Neg Hx     Social History:  I have reviewed social history from my progress note on 04/17/2019. Social History   Socioeconomic History  . Marital status: Single    Spouse name: Not on file  . Number of children: Not on file  . Years of education: Not on file  . Highest education level: Not on file  Occupational History    Comment: on disability due to mood disorder  Tobacco Use  . Smoking status: Never Smoker  . Smokeless tobacco: Never Used  Substance and Sexual Activity  . Alcohol use: Yes    Comment: socially  . Drug use: Never  . Sexual activity: Not Currently    Birth control/protection: Post-menopausal  Other Topics  Concern  . Not on file  Social History Narrative  . Not on file   Social Determinants of Health   Financial Resource Strain: Low Risk   . Difficulty of Paying Living Expenses: Not hard at all  Food Insecurity: No Food Insecurity  . Worried About Programme researcher, broadcasting/film/video in the Last Year: Never true  . Ran Out of Food in the Last Year: Never true  Transportation Needs: No Transportation Needs  . Lack of Transportation (Medical): No  . Lack of Transportation (Non-Medical): No  Physical Activity: Inactive  . Days of Exercise per Week: 0 days  . Minutes of Exercise per Session: 0 min  Stress: Stress Concern Present  . Feeling of Stress : To some extent  Social Connections: Slightly Isolated  . Frequency of Communication with Friends and Family: More than three times a week  . Frequency of Social Gatherings with Friends and Family: More than three times a week  . Attends Religious Services: More than 4 times per year  . Active Member of Clubs or Organizations: Yes  . Attends Banker Meetings: More than 4 times per year  . Marital Status: Divorced    Allergies:  Allergies  Allergen Reactions  . Erythromycin Nausea Only, Other (See Comments) and Nausea And Vomiting    Upset stomach  . Lithium Rash    Exacerbates eczema and psoriasis  . Shellfish Allergy Swelling, Rash and Nausea And Vomiting  . Strawberry Extract     Metabolic Disorder Labs: Lab Results  Component Value Date   HGBA1C 6.1 (A) 06/04/2019   No results found for: PROLACTIN Lab Results  Component Value Date   CHOL 194 06/04/2019   TRIG 134 06/04/2019   HDL 51 06/04/2019   CHOLHDL 3.8 06/04/2019   LDLCALC 119 (H) 06/04/2019   LDLCALC 156 (H) 02/21/2019   Lab Results  Component Value Date   TSH 1.120 02/21/2019   TSH 1.200 11/18/2018    Therapeutic Level Labs: No results found for: LITHIUM No results found for: VALPROATE No components found for:  CBMZ  Current Medications: Current  Outpatient Medications  Medication Sig Dispense Refill  . ARIPiprazole (ABILIFY) 15 MG tablet Take by mouth once daily 90 tablet 0  . betamethasone dipropionate (DIPROLENE) 0.05 % ointment Apply topically 2 (two) times daily. 45 g 6  . Calcium Carb-Cholecalciferol (CALCIUM 1000 + D PO) Take 1 tablet by mouth.    . clobetasol ointment (TEMOVATE) 0.05 % Apply 1 application topically 2 (two) times daily. (Patient not taking: Reported on 07/03/2019) 30 g 11  . gabapentin (NEURONTIN) 100 MG capsule TAKE 1 CAPSULE(100 MG) BY MOUTH AT BEDTIME 90 capsule 0  . hydrOXYzine (ATARAX/VISTARIL) 25 MG tablet Take 0.5-1 tablets (12.5-25 mg total) by mouth 2 (two) times daily as needed. Severe anxiety  attacks only (Patient not taking: Reported on 07/03/2019) 60 tablet 1  . metFORMIN (GLUCOPHAGE) 1000 MG tablet Take 1 tablet (1,000 mg total) by mouth 2 (two) times daily with a meal. 180 tablet 1  . nystatin (MYCOSTATIN/NYSTOP) powder Apply topically 2 (two) times daily. Apply to creases. Keep area dry. (Patient not taking: Reported on 07/03/2019) 15 g 1  . Semaglutide,0.25 or 0.5MG /DOS, (OZEMPIC, 0.25 OR 0.5 MG/DOSE,) 2 MG/1.5ML SOPN Inject 0.5 mg into the skin once a week. 4 pen 3  . venlafaxine XR (EFFEXOR-XR) 150 MG 24 hr capsule Take 1 capsule (150 mg total) by mouth daily with breakfast. 90 capsule 3  . zolpidem (AMBIEN CR) 12.5 MG CR tablet Take 1 tablet (12.5 mg total) by mouth at bedtime as needed for sleep. 30 tablet 1   No current facility-administered medications for this visit.     Musculoskeletal: Strength & Muscle Tone: UTA Gait & Station: normal Patient leans: N/A  Psychiatric Specialty Exam: Review of Systems  Psychiatric/Behavioral: Negative for agitation, behavioral problems, confusion, decreased concentration, dysphoric mood, hallucinations, self-injury, sleep disturbance and suicidal ideas. The patient is not nervous/anxious and is not hyperactive.   All other systems reviewed and are  negative.   There were no vitals taken for this visit.There is no height or weight on file to calculate BMI.  General Appearance: Casual  Eye Contact:  Fair  Speech:  Clear and Coherent  Volume:  Normal  Mood:  Euthymic  Affect:  Congruent  Thought Process:  Goal Directed and Descriptions of Associations: Intact  Orientation:  Full (Time, Place, and Person)  Thought Content: Logical   Suicidal Thoughts:  No  Homicidal Thoughts:  No  Memory:  Immediate;   Fair Recent;   Fair Remote;   Fair  Judgement:  Fair  Insight:  Fair  Psychomotor Activity:  Normal  Concentration:  Concentration: Fair and Attention Span: Fair  Recall:  [employer] of Knowledge: Fair  Language: Fair  Akathisia:  No  Handed:  Right  AIMS (if indicated): UTA  Assets:  Communication Skills Desire for Improvement Housing Social Support  ADL's:  Intact  Cognition: WNL  Sleep:  Fair   Screenings: GAD-7     Office Visit from 02/21/2019 in Primary Care at [place]  Total GAD-7 Score  9    PHQ2-9     Telemedicine from 06/04/2019 in Primary Care at [facility] Visit from 02/21/2019 in Primary Care [facility] Visit from 11/18/2018 in Primary Care at [facility] Total Score  0  4  0  PHQ-9 Total Score  --  15  --       Assessment and Plan: Kinya is a 59 year old African-American female who has a history of bipolar disorder, PTSD, arthritis, prediabetic was evaluated by telemedicine today.  She is biologically predisposed given her history of trauma as well as health problems and family history of mental health problems.  Patient with psychosocial stressors at the current pandemic is currently stable on medications.  Plan as noted below.  Plan Bipolar disorder in remission Gabapentin 100 mg p.o. nightly Abilify 15 mg p.o. daily in the morning Venlafaxine 150 mg p.o. daily  PTSD-improving Ambien CR 12.5 mg p.o. nightly Hydroxyzine 12.5 to 25 mg p.o. twice daily as needed for anxiety attacks Continue  psychotherapy sessions with Ms. Peggy Bynum.  Follow-up in clinic in 6 to 8 weeks or sooner if needed.  I have spent atleast 20 minutes non face to face with patient  today. More than 50 % of the time was spent for preparing to see the patient ( e.g., review of test, records ), ordering medications and test ,psychoeducation and supportive psychotherapy and care coordination,as well as documenting clinical information in electronic health record. This note was generated in part or whole with voice recognition software. Voice recognition is usually quite accurate but there are transcription errors that can and very often do occur. I apologize for any typographical errors that were not detected and corrected.     Ursula Alert, MD 08/19/2019, 12:21 PM

## 2019-08-28 ENCOUNTER — Other Ambulatory Visit: Payer: Self-pay

## 2019-08-28 ENCOUNTER — Ambulatory Visit (INDEPENDENT_AMBULATORY_CARE_PROVIDER_SITE_OTHER): Payer: Medicare Other | Admitting: Psychiatry

## 2019-08-28 DIAGNOSIS — F4310 Post-traumatic stress disorder, unspecified: Secondary | ICD-10-CM | POA: Diagnosis not present

## 2019-08-28 DIAGNOSIS — F3178 Bipolar disorder, in full remission, most recent episode mixed: Secondary | ICD-10-CM | POA: Diagnosis not present
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
## 2019-09-02 ENCOUNTER — Ambulatory Visit (INDEPENDENT_AMBULATORY_CARE_PROVIDER_SITE_OTHER): Payer: Medicare Other | Admitting: Family Medicine

## 2019-09-02 ENCOUNTER — Other Ambulatory Visit: Payer: Self-pay

## 2019-09-02 ENCOUNTER — Encounter: Payer: Self-pay | Admitting: Family Medicine

## 2019-09-02 VITALS — BP 109/72 | HR 99 | Temp 98.6°F | Ht 60.0 in | Wt 230.0 lb

## 2019-09-02 DIAGNOSIS — L409 Psoriasis, unspecified: Secondary | ICD-10-CM

## 2019-09-02 DIAGNOSIS — R7303 Prediabetes: Secondary | ICD-10-CM

## 2019-09-02 DIAGNOSIS — F3162 Bipolar disorder, current episode mixed, moderate: Secondary | ICD-10-CM | POA: Diagnosis not present

## 2019-09-02 DIAGNOSIS — E785 Hyperlipidemia, unspecified: Secondary | ICD-10-CM

## 2019-09-02 MED ORDER — BETAMETHASONE DIPROPIONATE 0.05 % EX OINT: TOPICAL_OINTMENT | Freq: Two times a day (BID) | CUTANEOUS | 6 refills | Status: AC

## 2019-09-02 NOTE — Patient Instructions (Signed)
° ° ° °  If you have lab work done today you will be contacted with your lab results within the next 2 weeks.  If you have not heard from us then please contact us. The fastest way to get your results is to register for My Chart. ° ° °IF you received an x-ray today, you will receive an invoice from [facility]. Please contact [facility] at [phone] with questions or concerns regarding your invoice.  ° °IF you received labwork today, you will receive an invoice from [facility]. Please contact [facility] at [phone] with questions or concerns regarding your invoice.  ° °Our billing staff will not be able to assist you with questions regarding bills from these companies. ° °You will be contacted with the lab results as soon as they are available. The fastest way to get your results is to activate your My Chart account. Instructions are located on the last page of this paperwork. If you have not heard from us regarding the results in 2 weeks, please contact this office. °  ° ° ° °

## 2019-09-02 NOTE — Progress Notes (Signed)
5/18/20213:10 PM  Nichole Fox 09-May-1960, 59 y.o., female [id number]  Chief Complaint  Patient presents with  . Transitions Of Care    has no medical concerns    HPI:   Patient is a 59 y.o. female with past medical history significant for bipolar disorder, arthritis, psoriasis, HLP and prediabetes who presents today to establish care  Previous PCP Dr Nolon Rod, last OV feb 2021 Psych Dr Shea Evans, last OV May 2021 Medicare annual wellness in nov 2020 Evaluated for possible psoriatric arthritis by rheum, Dr Dora Sims in sept/oct 2020 - xrays favor mild OA, neg autotoimmune labs.  Not seeing derm - psoriasis well controlled with betamethasone and clobetasol as needed.   She has no acute concerns today She has been working on LFM - smaller portions, increasing fruit and veggies Is trying to increase time on recumbent bike  Lab Results  Component Value Date   HGBA1C 6.1 (A) 06/04/2019   HGBA1C 5.9 (H) 02/21/2019   HGBA1C 5.8 (H) 11/18/2018   Lab Results  Component Value Date   LDLCALC 119 (H) 06/04/2019   CREATININE 0.63 06/04/2019    There are no preventive care reminders to display for this patient.  Depression screen [facility] 2/9 06/04/2019 02/21/2019 11/18/2018  Decreased Interest 0 3 0  Down, Depressed, Hopeless 0 1 0  PHQ - 2 Score 0 4 0  Altered sleeping - 3 -  Tired, decreased energy - 1 -  Change in appetite - 3 -  Feeling bad or failure about yourself  - 0 -  Trouble concentrating - 0 -  Moving slowly or fidgety/restless - 3 -  Suicidal thoughts - 1 -  PHQ-9 Score - 15 -  Difficult doing work/chores - Somewhat difficult -    Fall Risk  09/02/2019 06/04/2019 02/21/2019 11/18/2018  Falls in the past year? 0 0 0 0  Number falls in past yr: 0 0 0 0  Injury with Fall? 0 0 0 0  Risk for fall due to : - No Fall Risks - -  Follow up - Falls evaluation completed - Falls evaluation completed     Allergies  Allergen Reactions  . Erythromycin Nausea Only,  Other (See Comments) and Nausea And Vomiting    Upset stomach  . Lithium Rash    Exacerbates eczema and psoriasis  . Shellfish Allergy Swelling, Rash and Nausea And Vomiting  . Strawberry Extract     Prior to Admission medications   Medication Sig Start Date End Date Taking? Authorizing Provider  ARIPiprazole (ABILIFY) 15 MG tablet Take by mouth once daily 08/19/19  Yes Eappen, Saramma, MD  betamethasone dipropionate (DIPROLENE) 0.05 % ointment Apply topically 2 (two) times daily. 11/27/18  Yes Forrest Moron, MD  Calcium Carb-Cholecalciferol (CALCIUM 1000 + D PO) Take 1 tablet by mouth.   Yes [provider]  clobetasol ointment (TEMOVATE) 3.29 % Apply 1 application topically 2 (two) times daily. 11/18/18  Yes Stallings, Zoe A, MD  gabapentin (NEURONTIN) 100 MG capsule TAKE 1 CAPSULE(100 MG) BY MOUTH AT BEDTIME 08/19/19  Yes Eappen, Ria Clock, MD  metFORMIN (GLUCOPHAGE) 1000 MG tablet Take 1 tablet (1,000 mg total) by mouth 2 (two) times daily with a meal. 06/04/19  Yes Stallings, Zoe A, MD  nystatin (MYCOSTATIN/NYSTOP) powder Apply topically 2 (two) times daily. Apply to creases. Keep area dry. 02/21/19  Yes Stallings, Arlie Solomons, MD  Semaglutide,0.25 or 0.5MG /DOS, (OZEMPIC, 0.25 OR 0.5 MG/DOSE,) 2 MG/1.5ML SOPN Inject 0.5 mg into the skin once a week.  04/09/19  Yes Doristine Bosworth, MD  venlafaxine XR (EFFEXOR-XR) 150 MG 24 hr capsule Take 1 capsule (150 mg total) by mouth daily with breakfast. 03/15/19  Yes Stallings, Zoe A, MD  zolpidem (AMBIEN CR) 12.5 MG CR tablet Take 1 tablet (12.5 mg total) by mouth at bedtime as needed for sleep. 07/11/19  Yes Jomarie Longs, MD    Past Medical History:  Diagnosis Date  . Allergy   . Arthritis   . Bipolar disorder (HCC)   . Depression   . Goiter    on thyrodi, MD just watching  . Hyperlipidemia    diet controlled no meds  . Pre-diabetes    metformin, ozempic  . Psoriasis   . Psoriasis   . Psoriatic arthritis (HCC)   . PTSD (post-traumatic  stress disorder)   . Sickle cell trait [facility])     Past Surgical History:  Procedure Laterality Date  . abortions     x2  . COLONOSCOPY     greater 10 yrs ago no polyps  . EYE SURGERY     lasik  . KNEE ARTHROSCOPY Left   . TONSILLECTOMY    . WISDOM TOOTH EXTRACTION      Social History   Tobacco Use  . Smoking status: Never Smoker  . Smokeless tobacco: Never Used  Substance Use Topics  . Alcohol use: Yes    Comment: socially    Family History  Problem Relation Age of Onset  . Diabetes Mother   . Depression Mother   . Mental illness Mother   . Bipolar disorder Mother   . Thyroid disease Father   . Anemia Father   . Colon cancer Neg Hx   . Stomach cancer Neg Hx   . Rectal cancer Neg Hx   . Esophageal cancer Neg Hx     Review of Systems  Constitutional: Negative for chills and fever.  Respiratory: Negative for cough and shortness of breath.   Cardiovascular: Negative for chest pain, palpitations and leg swelling.  Gastrointestinal: Negative for abdominal pain, nausea and vomiting.     OBJECTIVE:  Today's Vitals   09/02/19 1508  BP: 109/72  Pulse: 99  Temp: 98.6 F (37 C)  SpO2: 95%  Weight: 230 lb (104.3 kg)  Height: 5' (1.524 m)   Body mass index is 44.92 kg/m.   Wt Readings from Last 3 Encounters:  09/02/19 230 lb (104.3 kg)  06/04/19 239 lb (108.4 kg)  03/21/19 242 lb 12.8 oz (110.1 kg)     Physical Exam Vitals and nursing note reviewed.  Constitutional:      Appearance: She is well-developed.  HENT:     Head: Normocephalic and atraumatic.     Mouth/Throat:     Pharynx: No oropharyngeal exudate.  Eyes:     General: No scleral icterus.    Conjunctiva/sclera: Conjunctivae normal.     Pupils: Pupils are equal, round, and reactive to light.  Cardiovascular:     Rate and Rhythm: Normal rate and regular rhythm.     Heart sounds: Normal heart sounds. No murmur. No friction rub. No gallop.   Pulmonary:     Effort: Pulmonary effort is normal.      Breath sounds: Normal breath sounds. No wheezing or rales.  Musculoskeletal:     Cervical back: Neck supple.  Skin:    General: Skin is warm and dry.  Neurological:     Mental Status: She is alert and oriented to person, place, and time.  No results found for this or any previous visit (from the past 24 hour(s)).  No results found.   ASSESSMENT and PLAN  1. Dyslipidemia 2. Prediabetes Cont with weight loss and diet changes. Cont to slowly increase exercise. Cont with metformin and ozempic.  3. Bipolar 1 disorder, mixed, moderate (HCC) Stable. Managed by psychiatry  4. Psoriasis Stable. Cont current regime  Other orders - betamethasone dipropionate (DIPROLENE) 0.05 % ointment; Apply topically 2 (two) times daily.  PMH, PSH, meds, allergies, Fhx, Shx, reviewed with patient today.  Return in about 3 months (around 12/03/2019).    Myles Lipps, MD Primary Care at [facility] [address] [place], [place] [address] Ph.  [phone] Fax [phone]

## 2019-09-18 ENCOUNTER — Other Ambulatory Visit: Payer: Self-pay

## 2019-09-18 ENCOUNTER — Ambulatory Visit (INDEPENDENT_AMBULATORY_CARE_PROVIDER_SITE_OTHER): Payer: Medicare Other | Admitting: Psychiatry

## 2019-09-18 DIAGNOSIS — F4310 Post-traumatic stress disorder, unspecified: Secondary | ICD-10-CM

## 2019-09-18 DIAGNOSIS — F3178 Bipolar disorder, in full remission, most recent episode mixed: Secondary | ICD-10-CM
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
## 2019-09-29 ENCOUNTER — Other Ambulatory Visit: Payer: Self-pay | Admitting: Psychiatry

## 2019-09-29 DIAGNOSIS — F4310 Post-traumatic stress disorder, unspecified: Secondary | ICD-10-CM

## 2019-09-30 NOTE — Telephone Encounter (Signed)
°  Pt called left message that she needs refill on her ambien   zolpidem (AMBIEN CR) 12.5 MG CR tablet Medication Date: 07/11/2019 Department: [facility] Ordering/Authorizing: Jomarie Longs, MD  Order Providers  Prescribing Provider Encounter Provider  Jomarie Longs, MD Jomarie Longs, MD  Outpatient Medication Detail   Disp Refills Start End   zolpidem (AMBIEN CR) 12.5 MG CR tablet 30 tablet 1 07/11/2019    Sig - Route: Take 1 tablet (12.5 mg total) by mouth at bedtime as needed for sleep. - Oral   Sent to pharmacy as: zolpidem (AMBIEN CR) 12.5 MG CR tablet   Notes to Pharmacy: For next refill   E-Prescribing Status: Receipt confirmed by pharmacy (07/11/2019 9:48 AM EDT)

## 2019-10-09 ENCOUNTER — Ambulatory Visit (INDEPENDENT_AMBULATORY_CARE_PROVIDER_SITE_OTHER): Payer: Medicare Other | Admitting: Psychiatry

## 2019-10-09 ENCOUNTER — Other Ambulatory Visit: Payer: Self-pay

## 2019-10-09 DIAGNOSIS — F4310 Post-traumatic stress disorder, unspecified: Secondary | ICD-10-CM

## 2019-10-09 DIAGNOSIS — F3178 Bipolar disorder, in full remission, most recent episode mixed: Secondary | ICD-10-CM
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
## 2019-10-15 ENCOUNTER — Other Ambulatory Visit: Payer: Self-pay

## 2019-10-15 ENCOUNTER — Telehealth (INDEPENDENT_AMBULATORY_CARE_PROVIDER_SITE_OTHER): Payer: Medicare Other | Admitting: Psychiatry

## 2019-10-15 ENCOUNTER — Encounter: Payer: Self-pay | Admitting: Psychiatry

## 2019-10-15 DIAGNOSIS — F3178 Bipolar disorder, in full remission, most recent episode mixed: Secondary | ICD-10-CM | POA: Insufficient documentation

## 2019-10-15 DIAGNOSIS — F4310 Post-traumatic stress disorder, unspecified: Secondary | ICD-10-CM | POA: Diagnosis not present

## 2019-10-15 MED ORDER — ZOLPIDEM TARTRATE ER 12.5 MG PO TBCR: EXTENDED_RELEASE_TABLET | ORAL | 2 refills | Status: DC

## 2019-10-15 NOTE — Progress Notes (Signed)
Provider Location : [facility] Patient Location : [place]  Virtual Visit via Telephone Note  I connected with Nichole Fox on 10/15/19 at  4:20 PM EDT by telephone and verified that I am speaking with the correct person using two identifiers.   I discussed the limitations, risks, security and privacy concerns of performing an evaluation and management service by telephone and the availability of in person appointments. I also discussed with the patient that there may be a patient responsible charge related to this service. The patient expressed understanding and agreed to proceed.    I discussed the assessment and treatment plan with the patient. The patient was provided an opportunity to ask questions and all were answered. The patient agreed with the plan and demonstrated an understanding of the instructions.   The patient was advised to call back or seek an in-person evaluation if the symptoms worsen or if the condition fails to improve as anticipated.    BH MD OP Progress Note  10/15/2019 1:52 PM Nichole Fox  MRN:  [id number]  Chief Complaint:  Chief Complaint    Follow-up     HPI: Nichole Fox is a 59 year old African-American female on disability, lives currently in [place] with her aunt, has a history of bipolar disorder, PTSD, prediabetic, arthritis was evaluated by phone today.  Patient preferred to do a phone call.  Patient today reports she is currently doing well on the current medication regimen.  Patient denies any significant mood lability.  She denies any perceptual disturbances.  She reports sleep continues to be good on the Ambien.  She is compliant on medications and denies side effects.  Patient reports appetite is fair.  Patient reports she continues to be in therapy and reports therapy sessions is going well with Ms. Peggy Bynum.  Patient denies any other concerns today.    Visit Diagnosis    ICD-10-CM   1. Bipolar  disorder, in full remission, most recent episode mixed (HCC)  F31.78   2. PTSD (post-traumatic stress disorder)  F43.10 zolpidem (AMBIEN CR) 12.5 MG CR tablet    Past Psychiatric History: I have reviewed past psychiatric history from my progress note on 04/17/2019.  Past trials of lithium, Effexor, Depakote, Ambien, Belsomra, trazodone  Past Medical History:  Past Medical History:  Diagnosis Date  . Allergy   . Arthritis   . Bipolar disorder (HCC)   . Depression   . Goiter    on thyrodi, MD just watching  . Hyperlipidemia    diet controlled no meds  . Pre-diabetes    metformin, ozempic  . Psoriasis   . Psoriasis   . Psoriatic arthritis (HCC)   . PTSD (post-traumatic stress disorder)   . Sickle cell trait [facility])     Past Surgical History:  Procedure Laterality Date  . abortions     x2  . COLONOSCOPY     greater 10 yrs ago no polyps  . EYE SURGERY     lasik  . KNEE ARTHROSCOPY Left   . TONSILLECTOMY    . WISDOM TOOTH EXTRACTION      Family Psychiatric History: I have reviewed family psychiatric history from my progress note on 04/17/2019.  Family History:  Family History  Problem Relation Age of Onset  . Diabetes Mother   . Depression Mother   . Mental illness Mother   . Bipolar disorder Mother   . Thyroid disease Father   . Anemia Father   . Colon cancer Neg Hx   .  Stomach cancer Neg Hx   . Rectal cancer Neg Hx   . Esophageal cancer Neg Hx     Social History: I have reviewed social history from my progress note on 04/17/2019. Social History   Socioeconomic History  . Marital status: Single    Spouse name: Not on file  . Number of children: Not on file  . Years of education: Not on file  . Highest education level: Not on file  Occupational History    Comment: on disability due to mood disorder  Tobacco Use  . Smoking status: Never Smoker  . Smokeless tobacco: Never Used  Vaping Use  . Vaping Use: Never used  Substance and Sexual Activity  . Alcohol  use: Yes    Comment: socially  . Drug use: Never  . Sexual activity: Not Currently    Birth control/protection: Post-menopausal  Other Topics Concern  . Not on file  Social History Narrative  . Not on file   Social Determinants of Health   Financial Resource Strain: Low Risk   . Difficulty of Paying Living Expenses: Not hard at all  Food Insecurity: No Food Insecurity  . Worried About Programme researcher, broadcasting/film/video in the Last Year: Never true  . Ran Out of Food in the Last Year: Never true  Transportation Needs: No Transportation Needs  . Lack of Transportation (Medical): No  . Lack of Transportation (Non-Medical): No  Physical Activity: Inactive  . Days of Exercise per Week: 0 days  . Minutes of Exercise per Session: 0 min  Stress: Stress Concern Present  . Feeling of Stress : To some extent  Social Connections: Moderately Integrated  . Frequency of Communication with Friends and Family: More than three times a week  . Frequency of Social Gatherings with Friends and Family: More than three times a week  . Attends Religious Services: More than 4 times per year  . Active Member of Clubs or Organizations: Yes  . Attends Banker Meetings: More than 4 times per year  . Marital Status: Divorced    Allergies:  Allergies  Allergen Reactions  . Erythromycin Nausea Only, Other (See Comments) and Nausea And Vomiting    Upset stomach  . Lithium Rash    Exacerbates eczema and psoriasis  . Shellfish Allergy Swelling, Rash and Nausea And Vomiting  . Strawberry Extract     Metabolic Disorder Labs: Lab Results  Component Value Date   HGBA1C 6.1 (A) 06/04/2019   No results found for: PROLACTIN Lab Results  Component Value Date   CHOL 194 06/04/2019   TRIG 134 06/04/2019   HDL 51 06/04/2019   CHOLHDL 3.8 06/04/2019   LDLCALC 119 (H) 06/04/2019   LDLCALC 156 (H) 02/21/2019   Lab Results  Component Value Date   TSH 1.120 02/21/2019   TSH 1.200 11/18/2018     Therapeutic Level Labs: No results found for: LITHIUM No results found for: VALPROATE No components found for:  CBMZ  Current Medications: Current Outpatient Medications  Medication Sig Dispense Refill  . amoxicillin-clavulanate (AUGMENTIN) 875-125 MG tablet Take 1 tablet by mouth 2 (two) times daily.    . ARIPiprazole (ABILIFY) 15 MG tablet Take by mouth once daily 90 tablet 0  . betamethasone dipropionate (DIPROLENE) 0.05 % ointment Apply topically 2 (two) times daily. 50 g 6  . clobetasol ointment (TEMOVATE) 0.05 % Apply 1 application topically 2 (two) times daily. 30 g 11  . gabapentin (NEURONTIN) 100 MG capsule TAKE 1 CAPSULE(100 MG) BY  MOUTH AT BEDTIME 90 capsule 0  . metFORMIN (GLUCOPHAGE) 1000 MG tablet Take 1 tablet (1,000 mg total) by mouth 2 (two) times daily with a meal. 180 tablet 1  . Semaglutide,0.25 or 0.5MG /DOS, (OZEMPIC, 0.25 OR 0.5 MG/DOSE,) 2 MG/1.5ML SOPN Inject 0.5 mg into the skin once a week. 4 pen 3  . venlafaxine XR (EFFEXOR-XR) 150 MG 24 hr capsule Take 1 capsule (150 mg total) by mouth daily with breakfast. 90 capsule 3  . zolpidem (AMBIEN CR) 12.5 MG CR tablet TAKE 1 TABLET(12.5 MG) BY MOUTH AT BEDTIME AS NEEDED FOR SLEEP 30 tablet 2  . Calcium Carb-Cholecalciferol (CALCIUM 1000 + D PO) Take 1 tablet by mouth. (Patient not taking: Reported on 10/15/2019)    . chlorhexidine (PERIDEX) 0.12 % solution RINSE 15 ML IN MOUTH TWICE DAILY FOR 10 DAYS. DO NOT SWALLOW.    Marland Kitchen nystatin (MYCOSTATIN/NYSTOP) powder Apply topically 2 (two) times daily. Apply to creases. Keep area dry. (Patient not taking: Reported on 10/15/2019) 15 g 1   No current facility-administered medications for this visit.     Musculoskeletal: Strength & Muscle Tone: UTA Gait & Station: UTA Patient leans: N/A  Psychiatric Specialty Exam: Review of Systems  Psychiatric/Behavioral: Negative for agitation, behavioral problems, confusion, decreased concentration, dysphoric mood, hallucinations,  self-injury, sleep disturbance and suicidal ideas. The patient is not nervous/anxious and is not hyperactive.   All other systems reviewed and are negative.   There were no vitals taken for this visit.There is no height or weight on file to calculate BMI.  General Appearance: UTA  Eye Contact:  UTA  Speech:  Clear and Coherent  Volume:  Normal  Mood:  Euthymic  Affect:  UTA  Thought Process:  Goal Directed and Descriptions of Associations: Intact  Orientation:  Full (Time, Place, and Person)  Thought Content: Logical   Suicidal Thoughts:  No  Homicidal Thoughts:  No  Memory:  Immediate;   Fair Recent;   Fair Remote;   Fair  Judgement:  Fair  Insight:  Fair  Psychomotor Activity:  UTA  Concentration:  Concentration: Fair and Attention Span: Fair  Recall:  [employer] of Knowledge: Fair  Language: Fair  Akathisia:  No  Handed:  Right  AIMS (if indicated): UTA  Assets:  Communication Skills Desire for Improvement Housing Social Support  ADL's:  Intact  Cognition: WNL  Sleep:  Fair   Screenings: GAD-7     Office Visit from 09/02/2019 in Primary Care at [facility] Visit from 02/21/2019 in Primary Care at [place]  Total GAD-7 Score 5 9    PHQ2-9     Office Visit from 09/02/2019 in Primary Care at [facility] Telemedicine from 06/04/2019 in Primary Care at [facility] Visit from 02/21/2019 in Primary Care at [facility] Visit from 11/18/2018 in Primary Care at [facility] Total Score 1 0 4 0  PHQ-9 Total Score 6 -- 15 --       Assessment and Plan: Nichole Fox is a 59 year old African-American female who has a history of bipolar disorder, PTSD, arthritis, prediabetic was evaluated by telemedicine today.  She is biologically predisposed given her history of trauma as well as health problems and family history of mental health problems.  Patient with psychosocial stressors of the current pandemic.  Patient however is currently doing well on the current medication  regimen.  Plan as noted below.  Plan Bipolar disorder in remission Gabapentin 100 mg p.o. nightly Abilify 15 mg p.o. daily in the  morning Venlafaxine 150 mg p.o. daily  PTSD-improving Ambien CR 12.5 mg p.o. nightly. I have reviewed [place] controlled substance database.   Continue hydroxyzine 12.5 to 25 mg p.o. twice daily as needed for severe anxiety attacks Continue psychotherapy sessions with Ms. Peggy Bynum.  Follow-up in clinic in 2 -3 months or sooner if needed.  I have spent atleast 20 minutes non face to face with patient today. More than 50 % of the time was spent for preparing to see the patient ( e.g., review of test, records ),ordering medications and test ,psychoeducation and supportive psychotherapy and care coordination,as well as documenting clinical information in electronic health record. This note was generated in part or whole with voice recognition software. Voice recognition is usually quite accurate but there are transcription errors that can and very often do occur. I apologize for any typographical errors that were not detected and corrected.       Jomarie LongsSaramma Sya Nestler, MD 10/15/2019, 1:52 PM

## 2019-10-24 ENCOUNTER — Telehealth: Payer: Self-pay

## 2019-10-24 DIAGNOSIS — F4310 Post-traumatic stress disorder, unspecified: Secondary | ICD-10-CM

## 2019-10-24 DIAGNOSIS — F3178 Bipolar disorder, in full remission, most recent episode mixed: Secondary | ICD-10-CM

## 2019-10-24 MED ORDER — ARIPIPRAZOLE 15 MG PO TABS: 7.5000 mg | ORAL_TABLET | Freq: Every day | ORAL | 0 refills | Status: DC

## 2019-10-24 MED ORDER — GABAPENTIN 100 MG PO CAPS: 200.0000 mg | ORAL_CAPSULE | Freq: Every day | ORAL | 0 refills | Status: DC

## 2019-10-24 NOTE — Telephone Encounter (Signed)
pt called states that she her medications are not working for her and she wanted to speak with you about maybe changing dosage.

## 2019-10-24 NOTE — Telephone Encounter (Signed)
Returned call to patient.  Patient reports she is very lethargic during the day.  She is unable to take care of her grandchildren.  She wonders whether her medications are contributing to it.  Discussed with patient to reduce the Abilify to half tablet-7.5 mg p.o. daily  She is worried about having irritability if she tries to go down on the Abilify.  Discussed with patient to monitor her symptoms and if she is very irritable she could increase the gabapentin to 100 mg at bedtime.

## 2019-11-12 DIAGNOSIS — M170 Bilateral primary osteoarthritis of knee: Secondary | ICD-10-CM | POA: Diagnosis not present

## 2019-11-17 ENCOUNTER — Ambulatory Visit (INDEPENDENT_AMBULATORY_CARE_PROVIDER_SITE_OTHER): Payer: Medicare Other | Admitting: Psychiatry

## 2019-11-17 ENCOUNTER — Other Ambulatory Visit: Payer: Self-pay

## 2019-11-17 DIAGNOSIS — F3178 Bipolar disorder, in full remission, most recent episode mixed: Secondary | ICD-10-CM
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
## 2019-11-21 ENCOUNTER — Ambulatory Visit (INDEPENDENT_AMBULATORY_CARE_PROVIDER_SITE_OTHER): Payer: Medicare Other | Admitting: Family Medicine

## 2019-11-21 ENCOUNTER — Other Ambulatory Visit: Payer: Self-pay

## 2019-11-21 DIAGNOSIS — Z0000 Encounter for general adult medical examination without abnormal findings: Secondary | ICD-10-CM

## 2019-11-22 LAB — CMP14+EGFR
ALT: 19 IU/L (ref 0–32)
AST: 16 IU/L (ref 0–40)
Albumin/Globulin Ratio: 2 (ref 1.2–2.2)
Albumin: 4.3 g/dL (ref 3.8–4.9)
Alkaline Phosphatase: 102 IU/L (ref 48–121)
BUN/Creatinine Ratio: 10 (ref 9–23)
BUN: 8 mg/dL (ref 6–24)
Bilirubin Total: 0.5 mg/dL (ref 0.0–1.2)
CO2: 23 mmol/L (ref 20–29)
Calcium: 9.7 mg/dL (ref 8.7–10.2)
Chloride: 105 mmol/L (ref 96–106)
Creatinine, Ser: 0.79 mg/dL (ref 0.57–1.00)
GFR calc Af Amer: 96 mL/min/{1.73_m2} (ref >59)
GFR calc non Af Amer: 83 mL/min/{1.73_m2} (ref >59)
Globulin, Total: 2.2 g/dL (ref 1.5–4.5)
Glucose: 96 mg/dL (ref 65–99)
Potassium: 3.7 mmol/L (ref 3.5–5.2)
Sodium: 142 mmol/L (ref 134–144)
Total Protein: 6.5 g/dL (ref 6.0–8.5)

## 2019-11-22 LAB — CBC
Hematocrit: 39.4 % (ref 34.0–46.6)
Hemoglobin: 12.8 g/dL (ref 11.1–15.9)
MCH: 26.8 pg (ref 26.6–33.0)
MCHC: 32.5 g/dL (ref 31.5–35.7)
MCV: 83 fL (ref 79–97)
Platelets: 229 10*3/uL (ref 150–450)
RBC: 4.77 x10E6/uL (ref 3.77–5.28)
RDW: 14.1 % (ref 11.7–15.4)
WBC: 5 10*3/uL (ref 3.4–10.8)

## 2019-11-22 LAB — LIPID PANEL
Chol/HDL Ratio: 4.4 ratio (ref 0.0–4.4)
Cholesterol, Total: 230 mg/dL — ABNORMAL HIGH (ref 100–199)
HDL: 52 mg/dL (ref >39)
LDL Chol Calc (NIH): 160 mg/dL — ABNORMAL HIGH (ref 0–99)
Triglycerides: 100 mg/dL (ref 0–149)
VLDL Cholesterol Cal: 18 mg/dL (ref 5–40)

## 2019-11-22 LAB — TSH: TSH: 1.94 u[IU]/mL (ref 0.450–4.500)

## 2019-11-22 LAB — HEMOGLOBIN A1C
Est. average glucose Bld gHb Est-mCnc: 120 mg/dL
Hgb A1c MFr Bld: 5.8 % — ABNORMAL HIGH (ref 4.8–5.6)

## 2019-11-25 ENCOUNTER — Encounter: Payer: Self-pay | Admitting: Family Medicine

## 2019-11-25 ENCOUNTER — Ambulatory Visit (INDEPENDENT_AMBULATORY_CARE_PROVIDER_SITE_OTHER): Payer: Medicare Other | Admitting: Family Medicine

## 2019-11-25 ENCOUNTER — Ambulatory Visit: Payer: Medicare Other | Admitting: Family Medicine

## 2019-11-25 ENCOUNTER — Other Ambulatory Visit: Payer: Self-pay

## 2019-11-25 VITALS — BP 100/61 | HR 89 | Temp 97.1°F | Resp 16 | Ht 64.0 in | Wt 221.0 lb

## 2019-11-25 DIAGNOSIS — N898 Other specified noninflammatory disorders of vagina: Secondary | ICD-10-CM

## 2019-11-25 DIAGNOSIS — N760 Acute vaginitis: Secondary | ICD-10-CM

## 2019-11-25 DIAGNOSIS — R7303 Prediabetes: Secondary | ICD-10-CM | POA: Diagnosis not present

## 2019-11-25 DIAGNOSIS — E785 Hyperlipidemia, unspecified: Secondary | ICD-10-CM | POA: Diagnosis not present

## 2019-11-25 DIAGNOSIS — B9689 Other specified bacterial agents as the cause of diseases classified elsewhere: Secondary | ICD-10-CM

## 2019-11-25 LAB — POCT WET + KOH PREP
Trich by wet prep: ABSENT
Yeast by KOH: ABSENT
Yeast by wet prep: ABSENT

## 2019-11-25 MED ORDER — OZEMPIC (0.25 OR 0.5 MG/DOSE) 2 MG/1.5ML ~~LOC~~ SOPN: 0.5000 mg | PEN_INJECTOR | SUBCUTANEOUS | 5 refills | Status: AC

## 2019-11-25 MED ORDER — METRONIDAZOLE 500 MG PO TABS: 500.0000 mg | ORAL_TABLET | Freq: Two times a day (BID) | ORAL | 0 refills | Status: DC

## 2019-11-25 NOTE — Patient Instructions (Addendum)
If you have lab work done today you will be contacted with your lab results within the next 2 weeks.  If you have not heard from [place] then please contact us. The fastest way to get your results is to register for My Chart.   IF you received an x-ray today, you will receive an invoice from [facility]. Please contact [facility] at [phone] with questions or concerns regarding your invoice.   IF you received labwork today, you will receive an invoice from [facility]. Please contact [facility] at [phone] with questions or concerns regarding your invoice.   Our billing staff will not be able to assist you with questions regarding bills from these companies.  You will be contacted with the lab results as soon as they are available. The fastest way to get your results is to activate your My Chart account. Instructions are located on the last page of this paperwork. If you have not heard from [place] regarding the results in 2 weeks, please contact this office.     Preventing High Cholesterol Cholesterol is a white, waxy substance similar to fat that the human body needs to help build cells. The liver makes all the cholesterol that a person's body needs. Having high cholesterol (hypercholesterolemia) increases a person's risk for heart disease and stroke. Extra (excess) cholesterol comes from the food the person eats. High cholesterol can often be prevented with diet and lifestyle changes. If you already have high cholesterol, you can control it with diet and lifestyle changes and with medicine. How can high cholesterol affect me? If you have high cholesterol, deposits (plaques) may build up on the walls of your arteries. The arteries are the blood vessels that carry blood away from your heart. Plaques make the arteries narrower and stiffer. This can limit or block blood flow and cause blood clots to form. Blood clots:  Are tiny balls of cells that form in your blood.  Can  move to the heart or brain, causing a heart attack or stroke. Plaques in arteries greatly increase your risk for heart attack and stroke.Making diet and lifestyle changes can reduce your risk for these conditions that may threaten your life. What can increase my risk? This condition is more likely to develop in people who:  Eat foods that are high in saturated fat or cholesterol. Saturated fat is mostly found in: ? Foods that contain animal fat, such as red meat and some dairy products. ? Certain fatty foods made from plants, such as tropical oils.  Are overweight.  Are not getting enough exercise.  Have a family history of high cholesterol. What actions can I take to prevent this? Nutrition   Eat less saturated fat.  Avoid trans fats (partially hydrogenated oils). These are often found in margarine and in some baked goods, fried foods, and snacks bought in packages.  Avoid precooked or cured meat, such as sausages or meat loaves.  Avoid foods and drinks that have added sugars.  Eat more fruits, vegetables, and whole grains.  Choose healthy sources of protein, such as fish, poultry, lean cuts of red meat, beans, peas, lentils, and nuts.  Choose healthy sources of fat, such as: ? Nuts. ? Vegetable oils, especially olive oil. ? Fish that have healthy fats (omega-3 fatty acids), such as mackerel or salmon. The items listed above may not be a complete list of recommended foods and beverages. Contact a dietitian for more information. Lifestyle  Lose weight if you are overweight. Losing  5-10 lb (2.3-4.5 kg) can help prevent or control high cholesterol. It can also lower your risk for diabetes and high blood pressure. Ask your health care provider to help you with a diet and exercise plan to lose weight safely.  Do not use any products that contain nicotine or tobacco, such as cigarettes, e-cigarettes, and chewing tobacco. If you need help quitting, ask your health care  provider.  Limit your alcohol intake. ? Do not drink alcohol if:  Your health care provider tells you not to drink.  You are pregnant, may be pregnant, or are planning to become pregnant. ? If you drink alcohol:  Limit how much you use to:  0-1 drink a day for women.  0-2 drinks a day for men.  Be aware of how much alcohol is in your drink. In the [place] one drink equals one 12 oz bottle of beer (355 mL), one 5 oz glass of wine (148 mL), or one 1 oz glass of hard liquor (44 mL). Activity   Get enough exercise. Each week, do at least 150 minutes of exercise that takes a medium level of effort (moderate-intensity exercise). ? This is exercise that:  Makes your heart beat faster and makes you breathe harder than usual.  Allows you to still be able to talk. ? You could exercise in short sessions several times a day or longer sessions a few times a week. For example, on 5 days each week, you could walk fast or ride your bike 3 times a day for 10 minutes each time.  Do exercises as told by your health care provider. Medicines  In addition to diet and lifestyle changes, your health care provider may recommend medicines to help lower cholesterol. This may be a medicine to lower the amount of cholesterol your liver makes. You may need medicine if: ? Diet and lifestyle changes do not lower your cholesterol enough. ? You have high cholesterol and other risk factors for heart disease or stroke.  Take over-the-counter and prescription medicines only as told by your health care provider. General information  Manage your risk factors for high cholesterol. Talk with your health care provider about all your risk factors and how to lower your risk.  Manage other conditions that you have, such as diabetes or high blood pressure (hypertension).  Have blood tests to check your cholesterol levels at regular points in time as told by your health care provider.  Keep all follow-up visits as told  by your health care provider. This is important. Where to find more information  American Heart Association: www.heart.org  National Heart, Lung, and Blood Institute: Pop[employer].is Summary  High cholesterol increases your risk for heart disease and stroke. By keeping your cholesterol level low, you can reduce your risk for these conditions.  High cholesterol can often be prevented with diet and lifestyle changes.  Work with your health care provider to manage your risk factors, and have your blood tested regularly. This information is not intended to replace advice given to you by your health care provider. Make sure you discuss any questions you have with your health care provider. Document Revised: 07/26/2018 Document Reviewed: 12/11/2015 Elsevier Patient Education  2020 [employer].

## 2019-11-25 NOTE — Progress Notes (Signed)
8/10/20212:03 PM  Nichole Fox 1961-03-18, 59 y.o., female [id number]  Chief Complaint  Patient presents with  . Medicare Wellness  . Medication Refill    Ozempic    HPI:   Patient is a 59 y.o. female with past medical history significant for prediabetic, HLP, bipolar disorder, psoariasis who presents today for routine followup  Had labs done 4 days ago LDL 160 (119), A1c 5.8 (6.1) Normal cmp and TSH  The 10-year ASCVD risk score Denman George DC Jr., et al., 2013) is: 2.1% Has been eating more red meat and using normal butter than usual  Was having a mild bloody discharge for past 2 weeks  Now resolved Was having mild cramping, like she was having a period She started menopause about 2 years Last pap and STD nov 2020 - normal Has not been sexually active since last STD test  Depression screen [facility] 2/9 11/25/2019 09/02/2019 06/04/2019  Decreased Interest 0 0 0  Down, Depressed, Hopeless 0 1 0  PHQ - 2 Score 0 1 0  Altered sleeping - 1 -  Tired, decreased energy - 1 -  Change in appetite - 0 -  Feeling bad or failure about yourself  - 1 -  Trouble concentrating - 2 -  Moving slowly or fidgety/restless - 0 -  Suicidal thoughts - 0 -  PHQ-9 Score - 6 -  Difficult doing work/chores - - -    Fall Risk  11/25/2019 09/02/2019 06/04/2019 02/21/2019 11/18/2018  Falls in the past year? 0 0 0 0 0  Number falls in past yr: - 0 0 0 0  Injury with Fall? - 0 0 0 0  Risk for fall due to : - - No Fall Risks - -  Follow up Falls evaluation completed - Falls evaluation completed - Falls evaluation completed     Allergies  Allergen Reactions  . Erythromycin Nausea Only, Other (See Comments) and Nausea And Vomiting    Upset stomach  . Lithium Rash    Exacerbates eczema and psoriasis  . Shellfish Allergy Swelling, Rash and Nausea And Vomiting  . Strawberry Extract     Prior to Admission medications   Medication Sig Start Date End Date Taking? Authorizing Provider  clobetasol  ointment (TEMOVATE) 0.05 % Apply 1 application topically 2 (two) times daily. 11/18/18  Yes Collie Siad A, MD  gabapentin (NEURONTIN) 100 MG capsule Take 2 capsules (200 mg total) by mouth at bedtime. 10/24/19  Yes Eappen, Levin Bacon, MD  Semaglutide,0.25 or 0.5MG /DOS, (OZEMPIC, 0.25 OR 0.5 MG/DOSE,) 2 MG/1.5ML SOPN Inject 0.5 mg into the skin once a week. 04/09/19  Yes Doristine Bosworth, MD  venlafaxine XR (EFFEXOR-XR) 150 MG 24 hr capsule Take 1 capsule (150 mg total) by mouth daily with breakfast. 03/15/19  Yes Stallings, Zoe A, MD  zolpidem (AMBIEN CR) 12.5 MG CR tablet TAKE 1 TABLET(12.5 MG) BY MOUTH AT BEDTIME AS NEEDED FOR SLEEP 10/15/19  Yes Eappen, Levin Bacon, MD  ARIPiprazole (ABILIFY) 15 MG tablet Take 0.5 tablets (7.5 mg total) by mouth daily. Take by mouth once daily Pt has supplies Patient not taking: Reported on 11/25/2019 10/24/19   Jomarie Longs, MD  betamethasone dipropionate (DIPROLENE) 0.05 % ointment Apply topically 2 (two) times daily. 09/02/19   Myles Lipps, MD  Calcium Carb-Cholecalciferol (CALCIUM 1000 + D PO) Take 1 tablet by mouth.  Patient not taking: Reported on 11/25/2019    [provider]  chlorhexidine (PERIDEX) 0.12 % solution RINSE 15 ML IN MOUTH TWICE  DAILY FOR 10 DAYS. DO NOT SWALLOW. Patient not taking: Reported on 11/25/2019 09/17/19   [provider]  metFORMIN (GLUCOPHAGE) 1000 MG tablet Take 1 tablet (1,000 mg total) by mouth 2 (two) times daily with a meal. Patient not taking: Reported on 11/25/2019 06/04/19   Doristine Bosworth, MD  nystatin (MYCOSTATIN/NYSTOP) powder Apply topically 2 (two) times daily. Apply to creases. Keep area dry. Patient not taking: Reported on 11/25/2019 02/21/19   Doristine Bosworth, MD    Past Medical History:  Diagnosis Date  . Allergy   . Arthritis   . Bipolar disorder (HCC)   . Depression   . Goiter    on thyrodi, MD just watching  . Hyperlipidemia    diet controlled no meds  . Pre-diabetes    metformin, ozempic   . Psoriasis   . Psoriasis   . Psoriatic arthritis (HCC)   . PTSD (post-traumatic stress disorder)   . Sickle cell trait [facility])     Past Surgical History:  Procedure Laterality Date  . abortions     x2  . COLONOSCOPY     greater 10 yrs ago no polyps  . EYE SURGERY     lasik  . KNEE ARTHROSCOPY Left   . TONSILLECTOMY    . WISDOM TOOTH EXTRACTION      Social History   Tobacco Use  . Smoking status: Never Smoker  . Smokeless tobacco: Never Used  Substance Use Topics  . Alcohol use: Yes    Comment: socially    Family History  Problem Relation Age of Onset  . Diabetes Mother   . Depression Mother   . Mental illness Mother   . Bipolar disorder Mother   . Thyroid disease Father   . Anemia Father   . Colon cancer Neg Hx   . Stomach cancer Neg Hx   . Rectal cancer Neg Hx   . Esophageal cancer Neg Hx     Review of Systems  Constitutional: Negative for chills and fever.  Respiratory: Negative for cough and shortness of breath.   Cardiovascular: Negative for chest pain, palpitations and leg swelling.  Gastrointestinal: Negative for abdominal pain, nausea and vomiting.  per hpi   OBJECTIVE:  Today's Vitals   11/25/19 1354  BP: 100/61  Pulse: 89  Resp: 16  Temp: (!) 97.1 F (36.2 C)  TempSrc: Temporal  SpO2: 96%  Weight: 221 lb (100.2 kg)  Height: 5\' 4"  (1.626 m)   Body mass index is 37.93 kg/m.  Wt Readings from Last 3 Encounters:  11/25/19 221 lb (100.2 kg)  09/02/19 230 lb (104.3 kg)  06/04/19 239 lb (108.4 kg)    Physical Exam Vitals and nursing note reviewed. Exam conducted with a chaperone present.  Constitutional:      Appearance: She is well-developed.  HENT:     Head: Normocephalic and atraumatic.     Mouth/Throat:     Pharynx: No oropharyngeal exudate.  Eyes:     General: No scleral icterus.    Extraocular Movements: Extraocular movements intact.     Conjunctiva/sclera: Conjunctivae normal.     Pupils: Pupils are equal, round, and  reactive to light.  Cardiovascular:     Rate and Rhythm: Normal rate and regular rhythm.     Heart sounds: Normal heart sounds. No murmur heard.  No friction rub. No gallop.   Pulmonary:     Effort: Pulmonary effort is normal.     Breath sounds: Normal breath sounds. No wheezing, rhonchi or  rales.  Genitourinary:    General: Normal vulva.     Vagina: Vaginal discharge (whitish, thick) present. No bleeding or lesions.     Cervix: No cervical motion tenderness, friability, lesion or cervical bleeding.     Uterus: Not fixed and not tender.      Adnexa:        Right: No mass or tenderness.         Left: No mass or tenderness.    Musculoskeletal:     Cervical back: Neck supple.  Skin:    General: Skin is warm and dry.  Neurological:     Mental Status: She is alert and oriented to person, place, and time.     Results for orders placed or performed in visit on 11/25/19 (from the past 24 hour(s))  POCT Wet + KOH Prep     Status: Abnormal   Collection Time: 11/25/19  2:31 PM  Result Value Ref Range   Yeast by KOH Absent Absent   Yeast by wet prep Absent Absent   WBC by wet prep Few Few   Clue Cells Wet Prep HPF POC Moderate (A) None   Trich by wet prep Absent Absent   Bacteria Wet Prep HPF POC Moderate (A) Few   Epithelial Cells By [employer] ([employer]) Few None, Few, Too numerous to count   RBC,UR,HPF,POC None None RBC/hpf    No results found.   ASSESSMENT and PLAN  1. Bacterial vaginosis 2. Vaginal discharge, bloody - POCT Wet + KOH Prep + BV  Discussed supportive measures, new meds r/se/b and RTC precautions.  3. Prediabetes Controlled. Continue current regime.   4. Dyslipidemia LDL worse. Discussed LFM/dietary changes  5. Morbid obesity (HCC) Cont to work on weight loss - Semaglutide,0.25 or 0.5MG /DOS, (OZEMPIC, 0.25 OR 0.5 MG/DOSE,) 2 MG/1.5ML SOPN; Inject 0.375 mLs (0.5 mg total) into the skin once a week.  Other orders - metroNIDAZOLE (FLAGYL) 500 MG tablet; Take  1 tablet (500 mg total) by mouth 2 (two) times daily.  Return in about 6 months (around 05/27/2020).    Myles Lipps, MD Primary Care at [facility] [address] [place], [place] [address] Ph.  [phone] Fax [phone]

## 2019-11-28 ENCOUNTER — Telehealth: Payer: Self-pay | Admitting: Family Medicine

## 2019-11-28 MED ORDER — METRONIDAZOLE 0.75 % VA GEL: 1.0000 | Freq: Every day | VAGINAL | 0 refills | Status: AC

## 2019-11-28 NOTE — Telephone Encounter (Signed)
Patient is calling to ask if she can get a vaginal script for metroNIDAZOLE (FLAGYL) 500 MG tablet [[id number]] Patient took the first dose. And thought she was taking the remaining tablets however, she was taking metformin in error.   Please advise [phone] Preferred Pharmacy- [facility]

## 2019-11-28 NOTE — Telephone Encounter (Signed)
Pt took one dose of flagyl on the 10th and took metformin the last few days by mistake, pt called today to see if you would send in the vaginal form as she would prefer it so she can still consume alcohol

## 2019-12-01 ENCOUNTER — Ambulatory Visit (INDEPENDENT_AMBULATORY_CARE_PROVIDER_SITE_OTHER): Payer: Medicare Other | Admitting: Psychiatry

## 2019-12-01 DIAGNOSIS — F3178 Bipolar disorder, in full remission, most recent episode mixed: Secondary | ICD-10-CM
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
## 2019-12-02 ENCOUNTER — Other Ambulatory Visit: Payer: Self-pay

## 2019-12-02 DIAGNOSIS — R7303 Prediabetes: Secondary | ICD-10-CM

## 2019-12-02 MED ORDER — METFORMIN HCL 1000 MG PO TABS: 1000.0000 mg | ORAL_TABLET | Freq: Two times a day (BID) | ORAL | 1 refills | Status: DC

## 2019-12-15 ENCOUNTER — Ambulatory Visit (HOSPITAL_COMMUNITY): Payer: Medicare Other | Admitting: Psychiatry

## 2020-01-01 ENCOUNTER — Encounter: Payer: Self-pay | Admitting: Family Medicine

## 2020-01-01 ENCOUNTER — Ambulatory Visit (HOSPITAL_COMMUNITY): Payer: Medicare Other | Admitting: Psychiatry

## 2020-01-01 ENCOUNTER — Other Ambulatory Visit: Payer: Self-pay

## 2020-01-02 ENCOUNTER — Ambulatory Visit: Payer: Medicare Other | Admitting: Family Medicine

## 2020-01-05 ENCOUNTER — Ambulatory Visit (INDEPENDENT_AMBULATORY_CARE_PROVIDER_SITE_OTHER): Payer: Medicare Other | Admitting: Family Medicine

## 2020-01-05 ENCOUNTER — Other Ambulatory Visit: Payer: Self-pay

## 2020-01-05 ENCOUNTER — Encounter: Payer: Self-pay | Admitting: Family Medicine

## 2020-01-05 VITALS — BP 105/68 | HR 84 | Temp 97.7°F | Ht 64.0 in | Wt 217.0 lb

## 2020-01-05 DIAGNOSIS — N760 Acute vaginitis: Secondary | ICD-10-CM

## 2020-01-05 DIAGNOSIS — B9689 Other specified bacterial agents as the cause of diseases classified elsewhere: Secondary | ICD-10-CM | POA: Diagnosis not present

## 2020-01-05 DIAGNOSIS — N898 Other specified noninflammatory disorders of vagina: Secondary | ICD-10-CM

## 2020-01-05 LAB — POCT WET + KOH PREP
Trich by wet prep: ABSENT
Yeast by KOH: ABSENT
Yeast by wet prep: ABSENT

## 2020-01-05 MED ORDER — CLINDAMYCIN PHOSPHATE 2 % VA CREA: 1.0000 | TOPICAL_CREAM | Freq: Every day | VAGINAL | 0 refills | Status: AC

## 2020-01-05 NOTE — Patient Instructions (Signed)
° ° ° °  If you have lab work done today you will be contacted with your lab results within the next 2 weeks.  If you have not heard from us then please contact us. The fastest way to get your results is to register for My Chart. ° ° °IF you received an x-ray today, you will receive an invoice from [facility]. Please contact [facility] at [phone] with questions or concerns regarding your invoice.  ° °IF you received labwork today, you will receive an invoice from [facility]. Please contact [facility] at [phone] with questions or concerns regarding your invoice.  ° °Our billing staff will not be able to assist you with questions regarding bills from these companies. ° °You will be contacted with the lab results as soon as they are available. The fastest way to get your results is to activate your My Chart account. Instructions are located on the last page of this paperwork. If you have not heard from us regarding the results in 2 weeks, please contact this office. °  ° ° ° °

## 2020-01-05 NOTE — Progress Notes (Signed)
9/20/20218:23 AM  Nichole Fox 05/10/1960, 59 y.o., female [id number]  Chief Complaint  Patient presents with  . Vaginal Discharge    wants referral for biopsy to be done on uterus, has bloody discharge for 2 month    HPI:   Patient is a 59 y.o. female with past medical history significant for prediabetic, HLP, bipolar disorder, psoariasis who presents today for vaginal bleeding  Seen a month ago for vaginal discharge - treated for BV with oral mzd She reports that brownish vaginal discharge resolved but she reports it has come back last week now gone again She reports that she has been having this for past 2 months She feels that now it seems cyclical and discharge looks like it might have some blood Having lower back camping menopause 2 years ago Last pap nov 2020, neg pap and HPV Neg STD testing nov 2020 - not sexually active since then  Depression screen [facility] 2/9 11/25/2019 09/02/2019 06/04/2019  Decreased Interest 0 0 0  Down, Depressed, Hopeless 0 1 0  PHQ - 2 Score 0 1 0  Altered sleeping - 1 -  Tired, decreased energy - 1 -  Change in appetite - 0 -  Feeling bad or failure about yourself  - 1 -  Trouble concentrating - 2 -  Moving slowly or fidgety/restless - 0 -  Suicidal thoughts - 0 -  PHQ-9 Score - 6 -  Difficult doing work/chores - - -    Fall Risk  01/05/2020 11/25/2019 09/02/2019 06/04/2019 02/21/2019  Falls in the past year? 0 0 0 0 0  Number falls in past yr: 0 - 0 0 0  Injury with Fall? 0 - 0 0 0  Risk for fall due to : - - - No Fall Risks -  Follow up - Falls evaluation completed - Falls evaluation completed -     Allergies  Allergen Reactions  . Erythromycin Nausea Only, Other (See Comments) and Nausea And Vomiting    Upset stomach  . Lithium Rash    Exacerbates eczema and psoriasis  . Shellfish Allergy Swelling, Rash and Nausea And Vomiting  . Strawberry Extract     Prior to Admission medications   Medication Sig Start Date End Date  Taking? Authorizing Provider  ARIPiprazole (ABILIFY) 15 MG tablet Take 0.5 tablets (7.5 mg total) by mouth daily. Take by mouth once daily Pt has supplies 10/24/19  Yes Eappen, Saramma, MD  betamethasone dipropionate (DIPROLENE) 0.05 % ointment Apply topically 2 (two) times daily. 09/02/19  Yes Myles Lipps, MD  Calcium Carb-Cholecalciferol (CALCIUM 1000 + D PO) Take 1 tablet by mouth.    Yes [provider]  chlorhexidine (PERIDEX) 0.12 % solution RINSE 15 ML IN MOUTH TWICE DAILY FOR 10 DAYS. DO NOT SWALLOW. 09/17/19  Yes [provider]  clobetasol ointment (TEMOVATE) 0.05 % Apply 1 application topically 2 (two) times daily. 11/18/18  Yes Collie Siad A, MD  gabapentin (NEURONTIN) 100 MG capsule Take 2 capsules (200 mg total) by mouth at bedtime. 10/24/19  Yes Eappen, Levin Bacon, MD  metroNIDAZOLE (METROGEL) 0.75 % vaginal gel Place 1 Applicatorful vaginally at bedtime. 11/28/19  Yes Myles Lipps, MD  nystatin (MYCOSTATIN/NYSTOP) powder Apply topically 2 (two) times daily. Apply to creases. Keep area dry. 02/21/19  Yes Stallings, Manus Rudd, MD  Semaglutide,0.25 or 0.5MG /DOS, (OZEMPIC, 0.25 OR 0.5 MG/DOSE,) 2 MG/1.5ML SOPN Inject 0.375 mLs (0.5 mg total) into the skin once a week. 11/25/19  Yes Koren Shiver M,  MD  venlafaxine XR (EFFEXOR-XR) 150 MG 24 hr capsule Take 1 capsule (150 mg total) by mouth daily with breakfast. 03/15/19  Yes Stallings, Zoe A, MD  zolpidem (AMBIEN CR) 12.5 MG CR tablet TAKE 1 TABLET(12.5 MG) BY MOUTH AT BEDTIME AS NEEDED FOR SLEEP 10/15/19  Yes Jomarie Longs, MD    Past Medical History:  Diagnosis Date  . Allergy   . Arthritis   . Bipolar disorder (HCC)   . Depression   . Goiter    on thyrodi, MD just watching  . Hyperlipidemia    diet controlled no meds  . Pre-diabetes    metformin, ozempic  . Psoriasis   . Psoriasis   . Psoriatic arthritis (HCC)   . PTSD (post-traumatic stress disorder)   . Sickle cell trait [facility])     Past Surgical History:   Procedure Laterality Date  . abortions     x2  . COLONOSCOPY     greater 10 yrs ago no polyps  . EYE SURGERY     lasik  . KNEE ARTHROSCOPY Left   . TONSILLECTOMY    . WISDOM TOOTH EXTRACTION      Social History   Tobacco Use  . Smoking status: Never Smoker  . Smokeless tobacco: Never Used  Substance Use Topics  . Alcohol use: Yes    Comment: socially    Family History  Problem Relation Age of Onset  . Diabetes Mother   . Depression Mother   . Mental illness Mother   . Bipolar disorder Mother   . Thyroid disease Father   . Anemia Father   . Colon cancer Neg Hx   . Stomach cancer Neg Hx   . Rectal cancer Neg Hx   . Esophageal cancer Neg Hx     ROS Per hpi  OBJECTIVE:  Today's Vitals   01/05/20 0817  BP: 105/68  Pulse: 84  Temp: 97.7 F (36.5 C)  SpO2: 96%  Weight: 217 lb (98.4 kg)  Height: 5\' 4"  (1.626 m)   Body mass index is 37.25 kg/m.   Physical Exam Vitals and nursing note reviewed. Exam conducted with a chaperone present.  Constitutional:      Appearance: She is well-developed.  HENT:     Head: Normocephalic and atraumatic.  Eyes:     General: No scleral icterus.    Conjunctiva/sclera: Conjunctivae normal.     Pupils: Pupils are equal, round, and reactive to light.  Pulmonary:     Effort: Pulmonary effort is normal.  Genitourinary:    General: Normal vulva.     Vagina: Vaginal discharge (white, mildly thick) present.     Cervix: No cervical motion tenderness, discharge, friability, erythema or cervical bleeding.     Uterus: Normal. Not enlarged, not fixed and not tender.      Adnexa: Right adnexa normal and left adnexa normal.       Right: No mass or tenderness.         Left: No mass or tenderness.    Musculoskeletal:     Cervical back: Neck supple.  Skin:    General: Skin is warm and dry.  Neurological:     Mental Status: She is alert and oriented to person, place, and time.     Results for orders placed or performed in visit  on 01/05/20 (from the past 24 hour(s))  POCT Wet + KOH Prep     Status: Abnormal   Collection Time: 01/05/20  8:47 AM  Result Value Ref Range  Yeast by KOH Absent Absent   Yeast by wet prep Absent Absent   WBC by wet prep Few Few   Clue Cells Wet Prep HPF POC Moderate (A) None   Trich by wet prep Absent Absent   Bacteria Wet Prep HPF POC Many (A) Few   Epithelial Cells By [employer] ([employer]) Moderate (A) None, Few, Too numerous to count   RBC,UR,HPF,POC None None RBC/hpf    No results found.   ASSESSMENT and PLAN  1. Vaginal discharge, bloody - POCT Wet + KOH Prep - recurrent BV - treating with vag clinda.  - Ambulatory referral to Obstetrics / Gynecology - given concerns for bloody discharge  2. Bacterial vaginosis  Other orders - clindamycin (CLEOCIN) 2 % vaginal cream; Place 1 Applicatorful vaginally at bedtime.  No follow-ups on file.    Myles Lipps, MD Primary Care at [facility] [address] [place], [place] [address] Ph.  [phone] Fax [phone]

## 2020-01-08 ENCOUNTER — Ambulatory Visit: Payer: Medicare Other | Admitting: Family Medicine

## 2020-01-13 ENCOUNTER — Ambulatory Visit (HOSPITAL_COMMUNITY): Payer: Medicare Other | Admitting: Psychiatry

## 2020-01-13 ENCOUNTER — Telehealth: Payer: Self-pay

## 2020-01-13 DIAGNOSIS — F3178 Bipolar disorder, in full remission, most recent episode mixed: Secondary | ICD-10-CM

## 2020-01-13 DIAGNOSIS — F4310 Post-traumatic stress disorder, unspecified: Secondary | ICD-10-CM

## 2020-01-13 MED ORDER — ZOLPIDEM TARTRATE ER 12.5 MG PO TBCR: EXTENDED_RELEASE_TABLET | ORAL | 2 refills | Status: DC

## 2020-01-13 MED ORDER — ARIPIPRAZOLE 15 MG PO TABS: 7.5000 mg | ORAL_TABLET | Freq: Every day | ORAL | 0 refills | Status: DC

## 2020-01-13 MED ORDER — GABAPENTIN 100 MG PO CAPS: 200.0000 mg | ORAL_CAPSULE | Freq: Every day | ORAL | 0 refills | Status: DC

## 2020-01-13 NOTE — Telephone Encounter (Signed)
pt calledleft message that stated she needed refill on her medication

## 2020-01-13 NOTE — Telephone Encounter (Signed)
I have sent medications to pharmacy. 

## 2020-01-16 ENCOUNTER — Other Ambulatory Visit: Payer: Self-pay

## 2020-01-16 ENCOUNTER — Ambulatory Visit (INDEPENDENT_AMBULATORY_CARE_PROVIDER_SITE_OTHER): Payer: Medicare Other | Admitting: Psychiatry

## 2020-01-16 DIAGNOSIS — F3178 Bipolar disorder, in full remission, most recent episode mixed: Secondary | ICD-10-CM | POA: Diagnosis not present

## 2020-01-16 DIAGNOSIS — F4310 Post-traumatic stress disorder, unspecified: Secondary | ICD-10-CM | POA: Diagnosis not present
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
## 2020-01-30 ENCOUNTER — Other Ambulatory Visit: Payer: Self-pay

## 2020-01-30 ENCOUNTER — Ambulatory Visit (INDEPENDENT_AMBULATORY_CARE_PROVIDER_SITE_OTHER): Payer: Medicare Other | Admitting: Psychiatry

## 2020-01-30 DIAGNOSIS — F4310 Post-traumatic stress disorder, unspecified: Secondary | ICD-10-CM | POA: Diagnosis not present
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
## 2020-02-10 ENCOUNTER — Telehealth (INDEPENDENT_AMBULATORY_CARE_PROVIDER_SITE_OTHER): Payer: Medicare Other | Admitting: Psychiatry

## 2020-02-10 ENCOUNTER — Other Ambulatory Visit: Payer: Self-pay

## 2020-02-10 ENCOUNTER — Encounter: Payer: Self-pay | Admitting: Psychiatry

## 2020-02-10 DIAGNOSIS — F3161 Bipolar disorder, current episode mixed, mild: Secondary | ICD-10-CM

## 2020-02-10 DIAGNOSIS — F4310 Post-traumatic stress disorder, unspecified: Secondary | ICD-10-CM | POA: Diagnosis not present

## 2020-02-10 MED ORDER — GABAPENTIN 100 MG PO CAPS: 300.0000 mg | ORAL_CAPSULE | ORAL | 0 refills | Status: DC

## 2020-02-10 NOTE — Progress Notes (Signed)
Virtual Visit via Video Note  I connected with Nichole Fox on 02/10/20 at 11:00 AM EDT by a video enabled telemedicine application and verified that I am speaking with the correct person using two identifiers.  Location Provider Location : [facility] Patient Location : Home  Participants: Patient , Provider    I discussed the limitations of evaluation and management by telemedicine and the availability of in person appointments. The patient expressed understanding and agreed to proceed.    I discussed the assessment and treatment plan with the patient. The patient was provided an opportunity to ask questions and all were answered. The patient agreed with the plan and demonstrated an understanding of the instructions.   The patient was advised to call back or seek an in-person evaluation if the symptoms worsen or if the condition fails to improve as anticipated.  BH MD OP Progress Note  02/10/2020 2:51 PM Nichole Fox  MRN:  [id number]  Chief Complaint:  Chief Complaint    Follow-up     HPI: Nichole Fox is a 59 year old African-American female on disability, lives currently in [place] with her aunt, has a history of bipolar disorder, PTSD, prediabetic, arthritis was evaluated by telemedicine today.  Patient today reports that she stopped taking Abilify since it caused side effects of making her slow down.  Since stopping it she feels better with her movements overall.  She however reports her mood symptoms are getting worse since stopping it.  She does report episodes of being irritable on and off.  She reports sleep is good.  She denies any suicidality, homicidality or perceptual disturbances.  She is compliant on her other medications including gabapentin.  She is agreeable to increasing the dosage of gabapentin to manage her mood symptoms.  Patient denies any other concerns today.  Visit Diagnosis:    ICD-10-CM   1. Bipolar 1 disorder,  mixed, mild (HCC)  F31.61 gabapentin (NEURONTIN) 100 MG capsule  2. PTSD (post-traumatic stress disorder)  F43.10 gabapentin (NEURONTIN) 100 MG capsule    Past Psychiatric History: I have reviewed past psychiatric history from my progress note on 04/17/2019.  Past trials of lithium, Effexor, Depakote, Ambien, Belsomra, trazodone, Abilify  Past Medical History:  Past Medical History:  Diagnosis Date  . Allergy   . Arthritis   . Bipolar disorder (HCC)   . Depression   . Goiter    on thyrodi, MD just watching  . Hyperlipidemia    diet controlled no meds  . Pre-diabetes    metformin, ozempic  . Psoriasis   . Psoriasis   . Psoriatic arthritis (HCC)   . PTSD (post-traumatic stress disorder)   . Sickle cell trait [facility])     Past Surgical History:  Procedure Laterality Date  . abortions     x2  . COLONOSCOPY     greater 10 yrs ago no polyps  . EYE SURGERY     lasik  . KNEE ARTHROSCOPY Left   . TONSILLECTOMY    . WISDOM TOOTH EXTRACTION      Family Psychiatric History: I have reviewed family psychiatric history from my progress note on 04/17/2019  Family History:  Family History  Problem Relation Age of Onset  . Diabetes Mother   . Depression Mother   . Mental illness Mother   . Bipolar disorder Mother   . Thyroid disease Father   . Anemia Father   . Colon cancer Neg Hx   . Stomach cancer Neg Hx   . Rectal  cancer Neg Hx   . Esophageal cancer Neg Hx     Social History: I have reviewed social history from my progress note on 04/17/2019 Social History   Socioeconomic History  . Marital status: Single    Spouse name: Not on file  . Number of children: Not on file  . Years of education: Not on file  . Highest education level: Not on file  Occupational History    Comment: on disability due to mood disorder  Tobacco Use  . Smoking status: Never Smoker  . Smokeless tobacco: Never Used  Vaping Use  . Vaping Use: Never used  Substance and Sexual Activity  . Alcohol  use: Yes    Comment: socially  . Drug use: Never  . Sexual activity: Not Currently    Birth control/protection: Post-menopausal  Other Topics Concern  . Not on file  Social History Narrative  . Not on file   Social Determinants of Health   Financial Resource Strain:   . Difficulty of Paying Living Expenses: Not on file  Food Insecurity:   . Worried About Programme researcher, broadcasting/film/videounning Out of Food in the Last Year: Not on file  . Ran Out of Food in the Last Year: Not on file  Transportation Needs:   . Lack of Transportation (Medical): Not on file  . Lack of Transportation (Non-Medical): Not on file  Physical Activity:   . Days of Exercise per Week: Not on file  . Minutes of Exercise per Session: Not on file  Stress:   . Feeling of Stress : Not on file  Social Connections:   . Frequency of Communication with Friends and Family: Not on file  . Frequency of Social Gatherings with Friends and Family: Not on file  . Attends Religious Services: Not on file  . Active Member of Clubs or Organizations: Not on file  . Attends BankerClub or Organization Meetings: Not on file  . Marital Status: Not on file    Allergies:  Allergies  Allergen Reactions  . Erythromycin Nausea Only, Other (See Comments) and Nausea And Vomiting    Upset stomach  . Lithium Rash    Exacerbates eczema and psoriasis  . Shellfish Allergy Swelling, Rash and Nausea And Vomiting  . Strawberry Extract     Metabolic Disorder Labs: Lab Results  Component Value Date   HGBA1C 5.8 (H) 11/21/2019   No results found for: PROLACTIN Lab Results  Component Value Date   CHOL 230 (H) 11/21/2019   TRIG 100 11/21/2019   HDL 52 11/21/2019   CHOLHDL 4.4 11/21/2019   LDLCALC 160 (H) 11/21/2019   LDLCALC 119 (H) 06/04/2019   Lab Results  Component Value Date   TSH 1.940 11/21/2019   TSH 1.120 02/21/2019    Therapeutic Level Labs: No results found for: LITHIUM No results found for: VALPROATE No components found for:  CBMZ  Current  Medications: Current Outpatient Medications  Medication Sig Dispense Refill  . betamethasone dipropionate (DIPROLENE) 0.05 % ointment Apply topically 2 (two) times daily. 50 g 6  . Calcium Carb-Cholecalciferol (CALCIUM 1000 + D PO) Take 1 tablet by mouth.     . chlorhexidine (PERIDEX) 0.12 % solution RINSE 15 ML IN MOUTH TWICE DAILY FOR 10 DAYS. DO NOT SWALLOW.    . clindamycin (CLEOCIN) 2 % vaginal cream Place 1 Applicatorful vaginally at bedtime. 40 g 0  . clobetasol ointment (TEMOVATE) 0.05 % Apply 1 application topically 2 (two) times daily. 30 g 11  . gabapentin (NEURONTIN) 100 MG  capsule Take 3 capsules (300 mg total) by mouth as directed. Start taking 1 cap daily AM and 2 cap daily PM 270 capsule 0  . metroNIDAZOLE (METROGEL) 0.75 % vaginal gel Place 1 Applicatorful vaginally at bedtime. 70 g 0  . nystatin (MYCOSTATIN/NYSTOP) powder Apply topically 2 (two) times daily. Apply to creases. Keep area dry. 15 g 1  . Semaglutide,0.25 or 0.5MG /DOS, (OZEMPIC, 0.25 OR 0.5 MG/DOSE,) 2 MG/1.5ML SOPN Inject 0.375 mLs (0.5 mg total) into the skin once a week. 6 mL 5  . venlafaxine XR (EFFEXOR-XR) 150 MG 24 hr capsule Take 1 capsule (150 mg total) by mouth daily with breakfast. 90 capsule 3  . zolpidem (AMBIEN CR) 12.5 MG CR tablet TAKE 1 TABLET(12.5 MG) BY MOUTH AT BEDTIME AS NEEDED FOR SLEEP 30 tablet 2   No current facility-administered medications for this visit.     Musculoskeletal: Strength & Muscle Tone: UTA Gait & Station: normal Patient leans: N/A  Psychiatric Specialty Exam: Review of Systems  Psychiatric/Behavioral:       Irritable  All other systems reviewed and are negative.   There were no vitals taken for this visit.There is no height or weight on file to calculate BMI.  General Appearance: Casual  Eye Contact:  Fair  Speech:  Clear and Coherent  Volume:  Normal  Mood:  Irritable  Affect:  Congruent  Thought Process:  Goal Directed and Descriptions of Associations: Intact   Orientation:  Full (Time, Place, and Person)  Thought Content: Logical   Suicidal Thoughts:  No  Homicidal Thoughts:  No  Memory:  Immediate;   Fair Recent;   Fair Remote;   Fair  Judgement:  Fair  Insight:  Fair  Psychomotor Activity:  Normal  Concentration:  Concentration: Fair and Attention Span: Fair  Recall:  [employer] of Knowledge: Fair  Language: Fair  Akathisia:  No  Handed:  Right  AIMS (if indicated): UTA  Assets:  Communication Skills Desire for Improvement Housing Social Support  ADL's:  Intact  Cognition: WNL  Sleep:  Fair   Screenings: GAD-7     Office Visit from 09/02/2019 in Primary Care at [facility] Visit from 02/21/2019 in Primary Care at [place]  Total GAD-7 Score 5 9    PHQ2-9     Office Visit from 11/25/2019 in Primary Care at [facility] Visit from 09/02/2019 in Primary Care at [facility] Telemedicine from 06/04/2019 in [facility] Visit from 02/21/2019 in Primary Care at [facility] Visit from 11/18/2018 in Primary Care at [facility] Total Score 0 1 0 4 0  PHQ-9 Total Score -- 6 -- 15 --       Assessment and Plan: Khalilah Hoke is a 59 year old African-American female who has a history of bipolar disorder, PTSD, arthritis, prediabetic was evaluated by telemedicine today.  Patient is biologically predisposed given her history of trauma as well as health problems and family history of mental health problems.  Patient with psychosocial stressors of the current pandemic.  Patient is currently struggling with irritability and had to discontinue Abilify due to adverse side effects.  Discussed plan as noted below.  Plan Bipolar disorder-unstable Discontinue Abilify for side effects and noncompliance. Increase gabapentin to 100 mg p.o. daily in the morning and 200 mg p.o. nightly Venlafaxine 150 mg p.o. daily  PTSD-improving Ambien CR 12.5 mg p.o. nightly Continue hydroxyzine 12.5 to 25 mg p.o. twice daily as needed for  severe anxiety. Continue psychotherapy sessions as  needed.  Follow-up in clinic in 3 to 4 weeks or sooner if needed.  I have spent atleast 20 minutes face to face by video with patient today. More than 50 % of the time was spent for preparing to see the patient ( e.g., review of test, records ),ordering medications and test ,psychoeducation and supportive psychotherapy and care coordination,as well as documenting clinical information in electronic health record. This note was generated in part or whole with voice recognition software. Voice recognition is usually quite accurate but there are transcription errors that can and very often do occur. I apologize for any typographical errors that were not detected and corrected.       Jomarie Longs, MD 02/10/2020, 2:51 PM

## 2020-03-10 ENCOUNTER — Other Ambulatory Visit: Payer: Self-pay

## 2020-03-10 ENCOUNTER — Telehealth (INDEPENDENT_AMBULATORY_CARE_PROVIDER_SITE_OTHER): Payer: Medicare Other | Admitting: Psychiatry

## 2020-03-10 ENCOUNTER — Telehealth: Payer: Self-pay | Admitting: Psychiatry

## 2020-03-10 DIAGNOSIS — F4310 Post-traumatic stress disorder, unspecified: Secondary | ICD-10-CM

## 2020-03-10 DIAGNOSIS — F3161 Bipolar disorder, current episode mixed, mild: Secondary | ICD-10-CM

## 2020-03-10 MED ORDER — VENLAFAXINE HCL ER 150 MG PO CP24: 150.0000 mg | ORAL_CAPSULE | Freq: Every day | ORAL | 3 refills | Status: AC

## 2020-03-10 NOTE — Progress Notes (Signed)
Patient was able to connect for the evaluation however when she reported [place] was [place] of [place], Clinical research associate discussed that this evaluation could not be completed.  Patient was not aware about this.  She will call back to reschedule this appointment.

## 2020-03-10 NOTE — Telephone Encounter (Signed)
I have sent Effexor to pharmacy.  She is not due for Ambien.  She is not due for gabapentin.

## 2020-03-23 ENCOUNTER — Ambulatory Visit (HOSPITAL_COMMUNITY): Payer: Medicare Other | Admitting: Psychiatry

## 2020-04-06 ENCOUNTER — Ambulatory Visit (HOSPITAL_COMMUNITY): Payer: Medicare Other | Admitting: Psychiatry

## 2020-04-09 ENCOUNTER — Telehealth: Payer: Medicare Other | Admitting: Psychiatry

## 2020-04-10 IMAGING — MG DIGITAL SCREENING BILAT W/ TOMO W/ CAD
8 series · 8 of 24 positions shown · non-contrast
Comparison: Previous exam(s).

CLINICAL DATA: Screening.

EXAM:
DIGITAL SCREENING BILATERAL MAMMOGRAM WITH TOMO AND CAD

[R CC synth-2D]
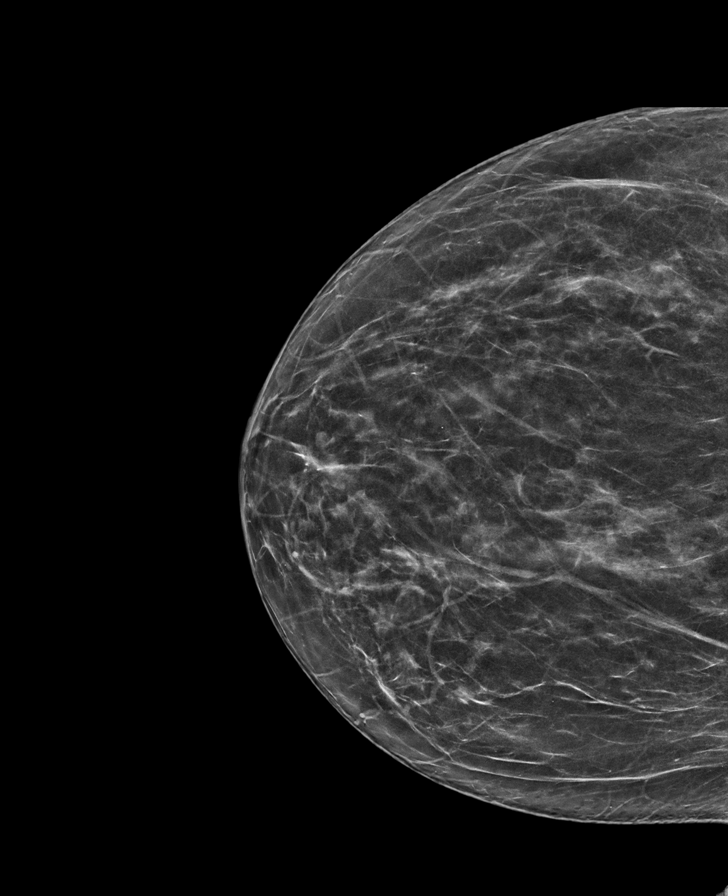

[R MLO synth-2D]
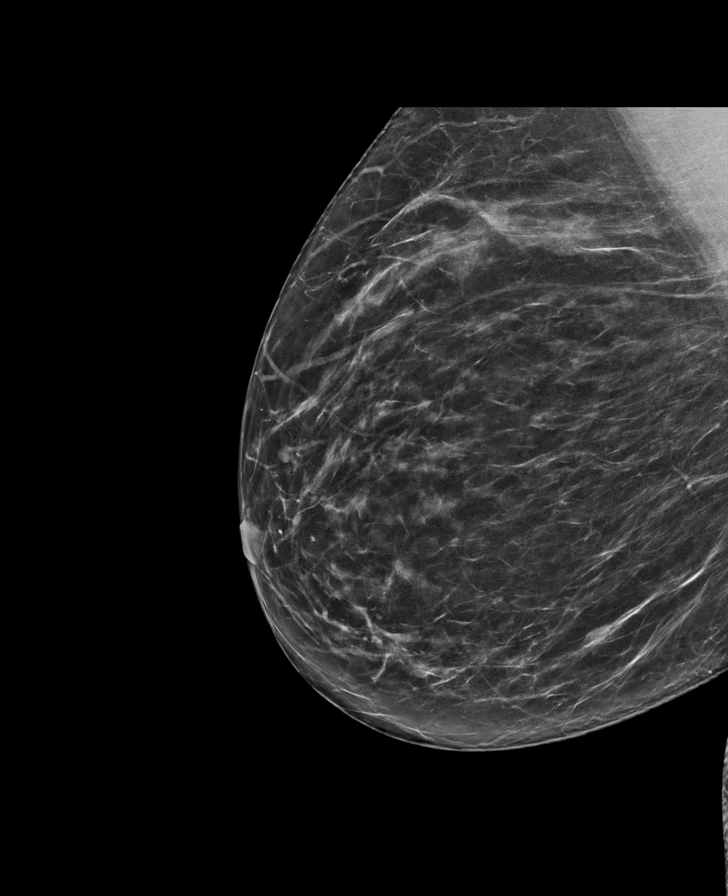

[L CC synth-2D]
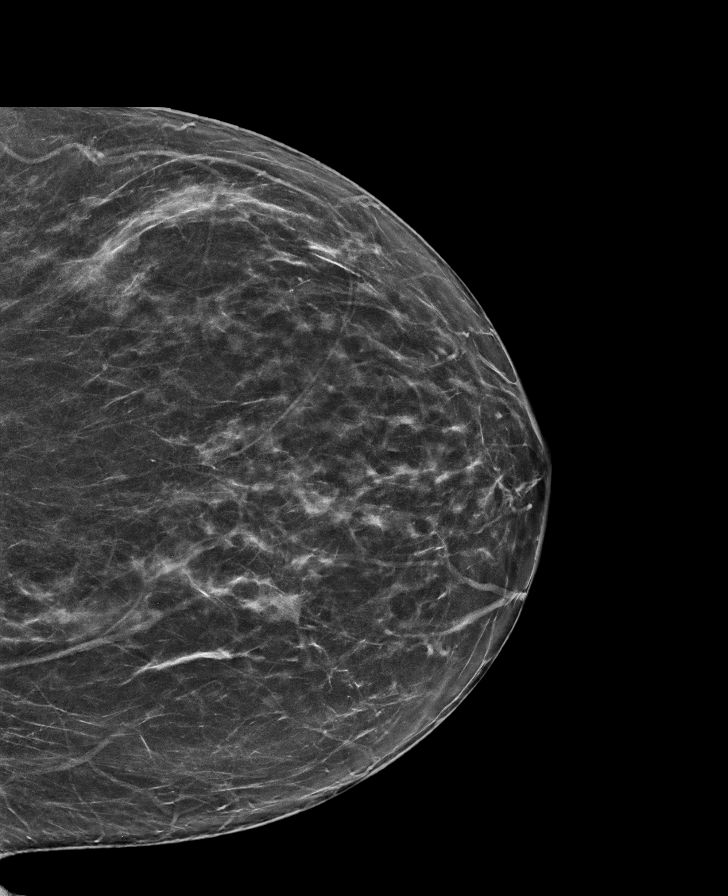

[L MLO synth-2D]
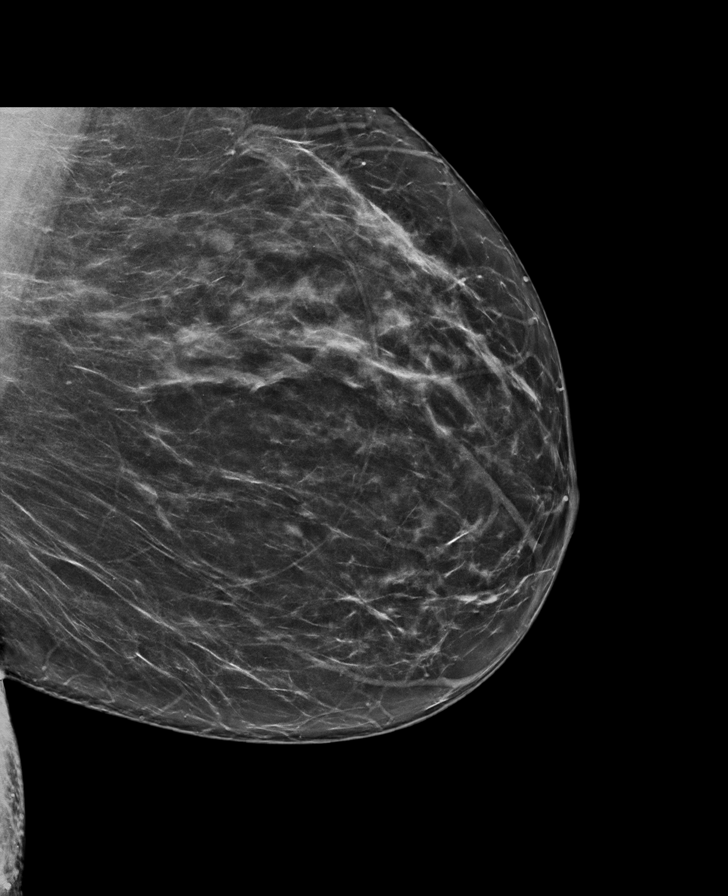

[R MLO tomo · tomo slice 37/74.0]
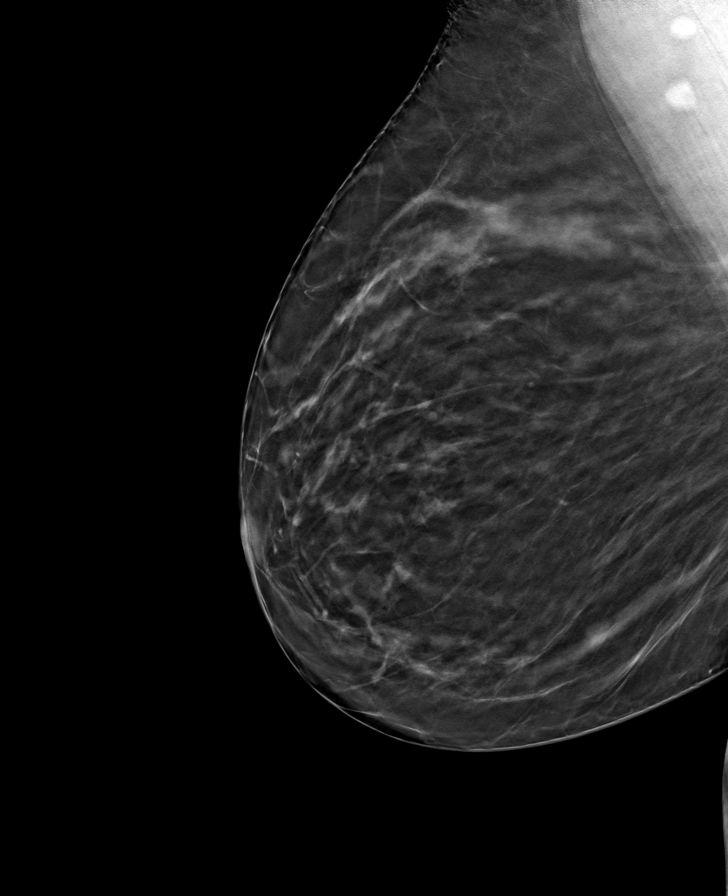

[L MLO tomo · tomo slice 40/79.0]
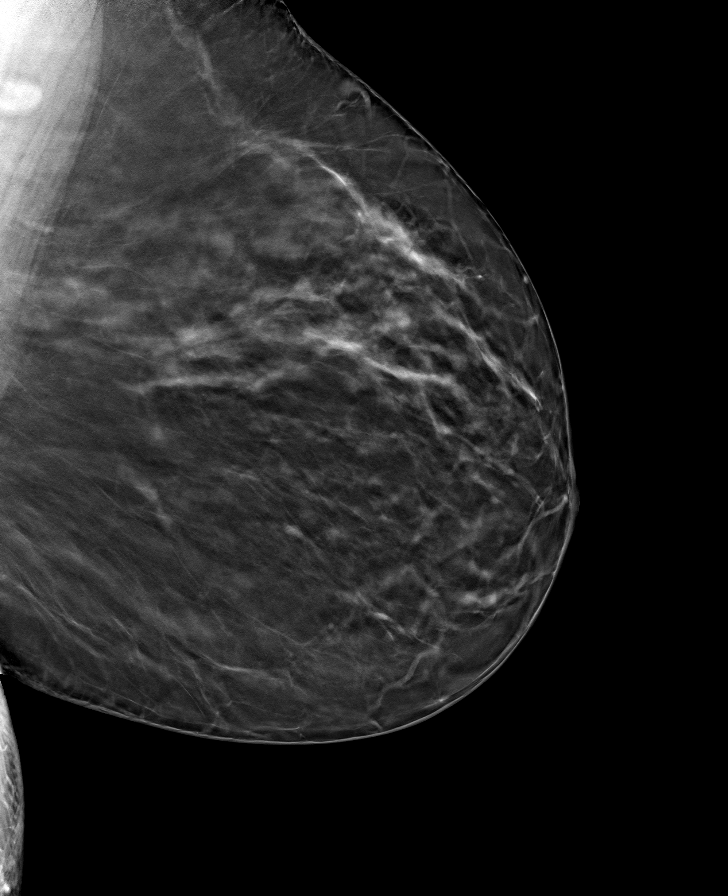

[L CC tomo · tomo slice 38/75.0]
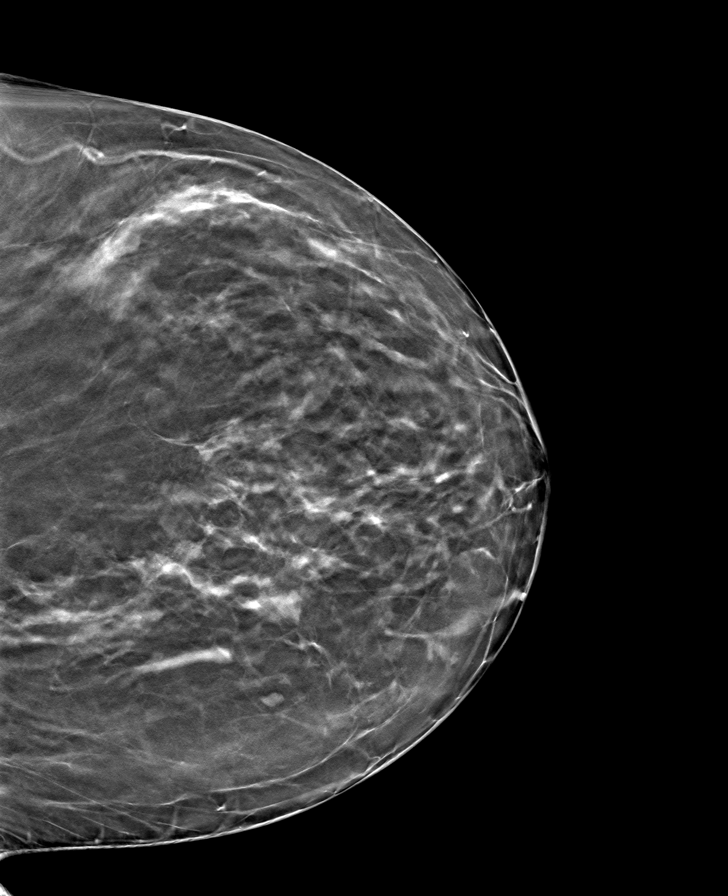

[R CC tomo · tomo slice 35/70.0]
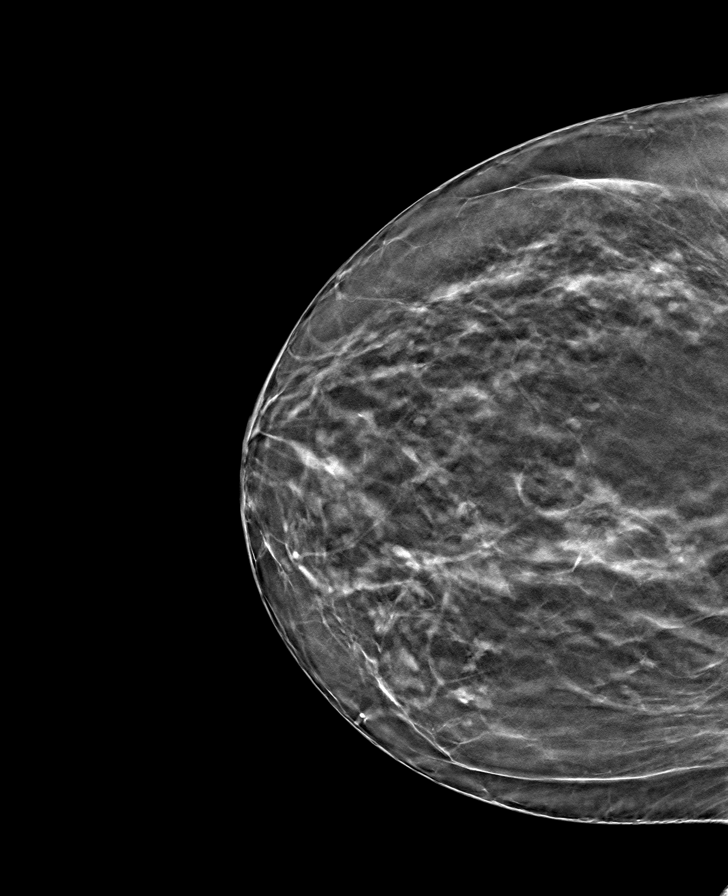

[8 of 24 positions shown; findings below may reference images not displayed]

ACR Breast Density Category b: There are scattered areas of
fibroglandular density.
FINDINGS: There are no findings suspicious for malignancy. Images were
processed with CAD.
IMPRESSION: No mammographic evidence of malignancy. A result letter of this
screening mammogram will be mailed directly to the patient.

RECOMMENDATION:
Screening mammogram in one year. (Code:[id number])

BI-RADS CATEGORY  1: Negative.

## 2020-04-13 ENCOUNTER — Telehealth: Payer: Self-pay | Admitting: *Deleted

## 2020-04-13 NOTE — Telephone Encounter (Signed)
Dr Jodie Echevaria patient called.  She needs a script for Ambien.  Her next appt is 05/05/20.  Please review.

## 2020-04-13 NOTE — Telephone Encounter (Signed)
Can you please call her and let her know that she should have one more refill on her Ambien rx sent on 09/28 by Dr. Elna Breslow. Please let her know to call the pharmacy for refill and if she has any problems refilling then she should call us.

## 2020-04-14 ENCOUNTER — Telehealth: Payer: Self-pay | Admitting: *Deleted

## 2020-04-14 DIAGNOSIS — F4310 Post-traumatic stress disorder, unspecified: Secondary | ICD-10-CM

## 2020-04-14 MED ORDER — ZOLPIDEM TARTRATE ER 12.5 MG PO TBCR: EXTENDED_RELEASE_TABLET | ORAL | 0 refills | Status: DC

## 2020-04-14 NOTE — Telephone Encounter (Signed)
Patient has called about her Ambien refill.  I called the pharmacy and she had no refills because she filled the last one at another pharmacy.  I called patient back and she had it filled in [place] while she was there. She is now back in [place] and using the pharmacy on file. She stated she is desperate for sleep.  Her next appt with Dr Elna Breslow is 1/19.  Please review.

## 2020-04-14 NOTE — Telephone Encounter (Signed)
Thanks

## 2020-04-14 NOTE — Telephone Encounter (Signed)
Patient has run out of the medication. The 9/28 script would only last to 12/28 because it only had 2 refills. Please review.

## 2020-04-14 NOTE — Telephone Encounter (Signed)
Yes but she has only filled on refill out of her 2 refills sent on 09/28 so she should have one more refill at pharmacy. Can you please call pharmacy and check if they have a refill on file and if they don't then I can send a refill.

## 2020-04-14 NOTE — Telephone Encounter (Signed)
Placed call to pharmacy.  Evidently she got the last refill at another pharmacy on 11/27 and that is why they told her there were none and told me the same. Please send refill to [facility] on [address].  I have told her to stay with the same pharmacy and not change unless necessary due to confusion it causes.  Patient agreed not to do so.

## 2020-04-14 NOTE — Telephone Encounter (Signed)
Ok I sent two weeks supply and Dr. Elna Breslow can send a refill if she runs out before her next appointment. Thanks

## 2020-04-14 NOTE — Telephone Encounter (Signed)
I will do it.

## 2020-04-15 ENCOUNTER — Other Ambulatory Visit: Payer: Self-pay | Admitting: Psychiatry

## 2020-04-15 DIAGNOSIS — F3161 Bipolar disorder, current episode mixed, mild: Secondary | ICD-10-CM

## 2020-04-15 DIAGNOSIS — F4310 Post-traumatic stress disorder, unspecified: Secondary | ICD-10-CM

## 2020-04-20 ENCOUNTER — Other Ambulatory Visit: Payer: Self-pay

## 2020-04-20 ENCOUNTER — Ambulatory Visit (INDEPENDENT_AMBULATORY_CARE_PROVIDER_SITE_OTHER): Payer: Medicare Other | Admitting: Psychiatry

## 2020-04-20 ENCOUNTER — Telehealth: Payer: Self-pay | Admitting: *Deleted

## 2020-04-20 DIAGNOSIS — F4310 Post-traumatic stress disorder, unspecified: Secondary | ICD-10-CM | POA: Diagnosis not present

## 2020-04-20 DIAGNOSIS — F3161 Bipolar disorder, current episode mixed, mild: Secondary | ICD-10-CM

## 2020-04-20 MED ORDER — ZOLPIDEM TARTRATE ER 12.5 MG PO TBCR: EXTENDED_RELEASE_TABLET | ORAL | 1 refills | Status: AC

## 2020-04-20 NOTE — Telephone Encounter (Signed)
Patient called for a refill of Ambien.  Dr Marquis Lunch gave her a refill of 14 but that will not carry her till appt 1/19.  Please review.

## 2020-04-20 NOTE — Telephone Encounter (Signed)
I have sent Ambien to pharmacy. °
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
## 2020-05-05 ENCOUNTER — Encounter: Payer: Self-pay | Admitting: Psychiatry

## 2020-05-05 ENCOUNTER — Other Ambulatory Visit: Payer: Self-pay

## 2020-05-05 ENCOUNTER — Telehealth (INDEPENDENT_AMBULATORY_CARE_PROVIDER_SITE_OTHER): Payer: Medicare Other | Admitting: Psychiatry

## 2020-05-05 DIAGNOSIS — D649 Anemia, unspecified: Secondary | ICD-10-CM | POA: Insufficient documentation

## 2020-05-05 DIAGNOSIS — F3110 Bipolar disorder, current episode manic without psychotic features, unspecified: Secondary | ICD-10-CM | POA: Insufficient documentation

## 2020-05-05 DIAGNOSIS — F4310 Post-traumatic stress disorder, unspecified: Secondary | ICD-10-CM

## 2020-05-05 DIAGNOSIS — F900 Attention-deficit hyperactivity disorder, predominantly inattentive type: Secondary | ICD-10-CM | POA: Insufficient documentation

## 2020-05-05 DIAGNOSIS — G8929 Other chronic pain: Secondary | ICD-10-CM | POA: Insufficient documentation

## 2020-05-05 DIAGNOSIS — M25562 Pain in left knee: Secondary | ICD-10-CM | POA: Insufficient documentation

## 2020-05-05 DIAGNOSIS — E042 Nontoxic multinodular goiter: Secondary | ICD-10-CM | POA: Insufficient documentation

## 2020-05-05 DIAGNOSIS — F3178 Bipolar disorder, in full remission, most recent episode mixed: Secondary | ICD-10-CM

## 2020-05-05 DIAGNOSIS — E785 Hyperlipidemia, unspecified: Secondary | ICD-10-CM | POA: Insufficient documentation

## 2020-05-05 DIAGNOSIS — F330 Major depressive disorder, recurrent, mild: Secondary | ICD-10-CM | POA: Insufficient documentation

## 2020-05-05 DIAGNOSIS — F5101 Primary insomnia: Secondary | ICD-10-CM | POA: Insufficient documentation

## 2020-05-05 MED ORDER — GABAPENTIN 100 MG PO CAPS: ORAL_CAPSULE | ORAL | 0 refills | Status: AC

## 2020-05-05 NOTE — Progress Notes (Signed)
Virtual Visit via Video Note  I connected with Nichole Fox on 05/05/20 at  2:40 PM EST by a video enabled telemedicine application and verified that I am speaking with the correct person using two identifiers.  Location Provider Location : [facility] Patient Location : Home  Participants: Patient , Provider   I discussed the limitations of evaluation and management by telemedicine and the availability of in person appointments. The patient expressed understanding and agreed to proceed.    I discussed the assessment and treatment plan with the patient. The patient was provided an opportunity to ask questions and all were answered. The patient agreed with the plan and demonstrated an understanding of the instructions.   The patient was advised to call back or seek an in-person evaluation if the symptoms worsen or if the condition fails to improve as anticipated.   BH MD OP Progress Note  05/05/2020 5:42 PM Nichole Fox  MRN:  [id number]  Chief Complaint:  Chief Complaint    Follow-up     HPI: Nichole Fox is a 60 year old African-American female on disability, lives currently in [place] with her aunt, has a history of bipolar disorder, PTSD, prediabetic, arthritis was evaluated by telemedicine today.  Patient today reports she is going to relocate to [place] next week.  She reports her aunt with whom she was staying is going to sell her house and is going to move in with her children.  Patient reports she is going to stay with her son until she can find her own place.  She reports she looks forward to that and reports her son as supportive.  Patient reports she does have anxiety symptoms about the upcoming relocation however overall she is coping okay.  She denies any mood swings, depression or manic or hypomanic symptoms.  She reports sleep is good.  She denies any suicidality, homicidality or perceptual disturbances.  Patient denies any  other concerns today.  Visit Diagnosis:    ICD-10-CM   1. Bipolar disorder, in full remission, most recent episode mixed (HCC)  F31.78 gabapentin (NEURONTIN) 100 MG capsule  2. PTSD (post-traumatic stress disorder)  F43.10 gabapentin (NEURONTIN) 100 MG capsule    Past Psychiatric History: I have reviewed past psychiatric history from my progress note on 04/17/2019.  Past trials of lithium, Effexor, Depakote, Ambien, Belsomra, trazodone, Abilify  Past Medical History:  Past Medical History:  Diagnosis Date  . Allergy   . Arthritis   . Bipolar disorder (HCC)   . Depression   . Goiter    on thyrodi, MD just watching  . Hyperlipidemia    diet controlled no meds  . Pre-diabetes    metformin, ozempic  . Psoriasis   . Psoriasis   . Psoriatic arthritis (HCC)   . PTSD (post-traumatic stress disorder)   . Sickle cell trait [facility])     Past Surgical History:  Procedure Laterality Date  . abortions     x2  . COLONOSCOPY     greater 10 yrs ago no polyps  . EYE SURGERY     lasik  . KNEE ARTHROSCOPY Left   . TONSILLECTOMY    . WISDOM TOOTH EXTRACTION      Family Psychiatric History: I have reviewed family psychiatric history from my progress note on 04/17/2019  Family History:  Family History  Problem Relation Age of Onset  . Diabetes Mother   . Depression Mother   . Mental illness Mother   . Bipolar disorder Mother   .  Thyroid disease Father   . Anemia Father   . Colon cancer Neg Hx   . Stomach cancer Neg Hx   . Rectal cancer Neg Hx   . Esophageal cancer Neg Hx     Social History: Reviewed social history from my progress note on 04/17/2019 Social History   Socioeconomic History  . Marital status: Single    Spouse name: Not on file  . Number of children: Not on file  . Years of education: Not on file  . Highest education level: Not on file  Occupational History    Comment: on disability due to mood disorder  Tobacco Use  . Smoking status: Never Smoker  .  Smokeless tobacco: Never Used  Vaping Use  . Vaping Use: Never used  Substance and Sexual Activity  . Alcohol use: Yes    Comment: socially  . Drug use: Never  . Sexual activity: Not Currently    Birth control/protection: Post-menopausal  Other Topics Concern  . Not on file  Social History Narrative  . Not on file   Social Determinants of Health   Financial Resource Strain: Not on file  Food Insecurity: Not on file  Transportation Needs: Not on file  Physical Activity: Not on file  Stress: Not on file  Social Connections: Not on file    Allergies:  Allergies  Allergen Reactions  . Erythromycin Nausea Only, Other (See Comments) and Nausea And Vomiting    Upset stomach  . Lithium Rash    Exacerbates eczema and psoriasis  . Shellfish Allergy Swelling, Rash and Nausea And Vomiting  . Bupropion Other (See Comments)  . Other Other (See Comments)  . Risperidone Other (See Comments)  . Strawberry Extract     Metabolic Disorder Labs: Lab Results  Component Value Date   HGBA1C 5.8 (H) 11/21/2019   No results found for: PROLACTIN Lab Results  Component Value Date   CHOL 230 (H) 11/21/2019   TRIG 100 11/21/2019   HDL 52 11/21/2019   CHOLHDL 4.4 11/21/2019   LDLCALC 160 (H) 11/21/2019   LDLCALC 119 (H) 06/04/2019   Lab Results  Component Value Date   TSH 1.940 11/21/2019   TSH 1.120 02/21/2019    Therapeutic Level Labs: No results found for: LITHIUM No results found for: VALPROATE No components found for:  CBMZ  Current Medications: Current Outpatient Medications  Medication Sig Dispense Refill  . ARIPiprazole (ABILIFY) 10 MG tablet 1 tab(s)    . betamethasone dipropionate (DIPROLENE) 0.05 % ointment Apply topically 2 (two) times daily. 50 g 6  . Calcium Carb-Cholecalciferol (CALCIUM 1000 + D PO) Take 1 tablet by mouth.     . chlorhexidine (PERIDEX) 0.12 % solution RINSE 15 ML IN MOUTH TWICE DAILY FOR 10 DAYS. DO NOT SWALLOW.    . clindamycin (CLEOCIN) 2 %  vaginal cream Place 1 Applicatorful vaginally at bedtime. 40 g 0  . clobetasol ointment (TEMOVATE) 0.05 % Apply 1 application topically 2 (two) times daily. 30 g 11  . gabapentin (NEURONTIN) 100 MG capsule Take 1 CAPSULE BY MOUTH IN THE MORNING AND 2 IN THE EVENING 270 capsule 0  . metFORMIN (GLUCOPHAGE) 1000 MG tablet 1 tab(s)    . metroNIDAZOLE (METROGEL) 0.75 % vaginal gel Place 1 Applicatorful vaginally at bedtime. 70 g 0  . nystatin (MYCOSTATIN/NYSTOP) powder Apply topically 2 (two) times daily. Apply to creases. Keep area dry. 15 g 1  . Semaglutide,0.25 or 0.5MG /DOS, (OZEMPIC, 0.25 OR 0.5 MG/DOSE,) 2 MG/1.5ML SOPN Inject 0.375 mLs (  0.5 mg total) into the skin once a week. 6 mL 5  . venlafaxine XR (EFFEXOR-XR) 150 MG 24 hr capsule Take 1 capsule (150 mg total) by mouth daily with breakfast. 90 capsule 3  . zolpidem (AMBIEN CR) 12.5 MG CR tablet TAKE 1 TABLET(12.5 MG) BY MOUTH AT BEDTIME AS NEEDED FOR SLEEP 30 tablet 1   No current facility-administered medications for this visit.     Musculoskeletal: Strength & Muscle Tone: UTA Gait & Station: UTA Patient leans: N/A  Psychiatric Specialty Exam: Review of Systems  Psychiatric/Behavioral: The patient is nervous/anxious.   All other systems reviewed and are negative.   There were no vitals taken for this visit.There is no height or weight on file to calculate BMI.  General Appearance: Casual  Eye Contact:  Fair  Speech:  Normal Rate  Volume:  Normal  Mood:  Anxious coping well  Affect:  Congruent  Thought Process:  Goal Directed and Descriptions of Associations: Intact  Orientation:  Full (Time, Place, and Person)  Thought Content: Logical   Suicidal Thoughts:  No  Homicidal Thoughts:  No  Memory:  Immediate;   Fair Recent;   Fair Remote;   Fair  Judgement:  Fair  Insight:  Fair  Psychomotor Activity:  Normal  Concentration:  Concentration: Fair and Attention Span: Fair  Recall:  [employer] of Knowledge: Fair  Language:  Fair  Akathisia:  No  Handed:  Right  AIMS (if indicated): UTA  Assets:  Communication Skills Desire for Improvement Housing Social Support  ADL's:  Intact  Cognition: WNL  Sleep:  Fair   Screenings: GAD-7   Flowsheet Row Office Visit from 09/02/2019 in Primary Care at [place] Office Visit from 02/21/2019 in Primary Care at [facility]  Total GAD-7 Score 5 9    PHQ2-9   Flowsheet Row Office Visit from 11/25/2019 in Primary Care at [facility] Visit from 09/02/2019 in Primary Care at [facility] Telemedicine from 06/04/2019 in Primary Care at [facility] Visit from 02/21/2019 in Primary Care at [facility] Visit from 11/18/2018 in Primary Care at [facility] Total Score 0 1 0 4 0  PHQ-9 Total Score -- 6 -- 15 --       Assessment and Plan: Nichole Fox is a 60 year old African-American female who has a history of bipolar disorder, PTSD, arthritis, prediabetic was evaluated by telemedicine today.  She is biologically predisposed given her history of trauma as well as health problems and family history of mental health problems.  Patient with psychosocial stressors of the current pandemic.  Patient is currently stable on current medication regimen.  Plan as noted below.  Plan Bipolar disorder in remission Gabapentin 100 mg p.o. daily in the morning and 200 mg p.o. nightly. Venlafaxine 150 mg p.o. daily.  PTSD-stable Continue venlafaxine as prescribed. Ambien CR 12.5 mg p.o. nightly Hydroxyzine 12.5-25 mg p.o. twice daily as needed for severe anxiety. Continue psychotherapy sessions as needed  Patient advised to sign a release to transfer medical records to her new provider in [place].  I have spent atleast 20 minutes face to face by video with patient today. More than 50 % of the time was spent for preparing to see the patient ( e.g., review of test, records ),  ordering medications and test ,psychoeducation and supportive psychotherapy and care coordination,as well as  documenting clinical information in electronic health record. This note was generated in part or whole with voice recognition software. Voice recognition is usually quite accurate  but there are transcription errors that can and very often do occur. I apologize for any typographical errors that were not detected and corrected.        Jomarie Longs, MD 05/06/2020, 9:33 AM

## 2022-05-03 LAB — CMP (EXT)
A/G Ratio (EXT): 2
ALT/SGPT (EXT): 29 U/L (ref 7–40)
AST/SGOT (EXT): 25 U/L (ref 13–40)
Albumin (EXT): 4.6 g/dL (ref 3.2–4.8)
Alkaline Phosphatase (EXT): 87 U/L (ref 46–116)
Anion Gap (EXT): 6 meq/L (ref 5–15)
BUN (EXT): 8 mg/dL — ABNORMAL LOW (ref 9–23)
Bilirubin, Total (EXT): 0.4 mg/dL (ref 0.3–1.2)
CO2 (EXT): 31 meq/L (ref 20.0–31.0)
CalciumCalcium (EXT): 9.8 mg/dL (ref 8.7–10.4)
Chloride (EXT): 108 meq/L (ref 99–113)
Creatinine (EXT): 0.78 mg/dL (ref 0.55–1.02)
GFR Estimated (Calc)GFR Estimated (Calc) (EXT): 87 (ref >90)
Globulin (EXT): 2.3 g/dL (ref 2.3–4)
Glucose (EXT): 86 mg/dL (ref 70–99)
Potassium (EXT): 4.3 meq/L (ref 3.5–5.5)
Protein (EXT): 6.9 g/dL (ref 5.7–8.2)
Sodium (EXT): 145 meq/L (ref 135–145)

## 2022-05-03 LAB — LIPID PROFILE (EXT)
Chol/HDL Ratio (EXT): 4.7 (ref 1.5–5.0)
Cholesterol (EXT): 267 mg/dL — ABNORMAL HIGH (ref 125–200)
HDL Cholesterol (EXT): 57 mg/dL (ref >40)
LDL Cholesterol (EXT): 199 mg/dL — ABNORMAL HIGH (ref <160)
Triglycerides (EXT): 122 mg/dL (ref 0–150)

## 2022-05-03 LAB — HEMOGLOBIN A1C
Estimated Average Glucose mg/dL (INT/EXT): 108 mg/dL
HEMOGLOBIN A1C % (INT/EXT): 5.4 % (ref <5.6)

## 2022-11-29 LAB — CMP (EXT)
A/G Ratio (EXT): 2
ALT/SGPT (EXT): 23 U/L (ref 7–40)
AST/SGOT (EXT): <8 U/L — ABNORMAL LOW (ref 13–40)
Albumin (EXT): 4.4 g/dL (ref 3.2–4.8)
Alkaline Phosphatase (EXT): 69 U/L (ref 46–145)
Anion Gap (EXT): 8 meq/L (ref 2–12)
BUN (EXT): 9 mg/dL (ref 9–23)
Bilirubin, Total (EXT): 0.4 mg/dL (ref 0.3–1.2)
CO2 (EXT): 28 meq/L (ref 20.0–31.0)
CalciumCalcium (EXT): 10 mg/dL (ref 8.7–10.4)
Chloride (EXT): 106 meq/L (ref 99–113)
Creatinine (EXT): 0.75 mg/dL (ref 0.55–1.02)
GFR Estimated (Calc)GFR Estimated (Calc) (EXT): 91 (ref >60)
Globulin (EXT): 2.2 g/dL (ref 1.6–3.5)
Glucose (EXT): 86 mg/dL (ref 70–99)
Potassium (EXT): 3.8 meq/L (ref 3.5–5.5)
Protein (EXT): 6.6 g/dL (ref 5.7–8.2)
Sodium (EXT): 142 meq/L (ref 135–145)

## 2022-11-29 LAB — LIPID PROFILE (EXT)
Chol/HDL Ratio (EXT): 3.3 (ref 1.5–5.0)
Cholesterol (EXT): 185 mg/dL (ref 125–200)
HDL Cholesterol (EXT): 57 mg/dL (ref >40)
LDL Cholesterol (EXT): 115 mg/dL (ref <160)
Triglycerides (EXT): 77 mg/dL (ref 0–150)

## 2022-11-29 LAB — HEMOGLOBIN A1C
Estimated Average Glucose mg/dL (INT/EXT): 105 mg/dL
HEMOGLOBIN A1C % (INT/EXT): 5.3 % (ref <5.6)

## 2022-12-29 LAB — HEPATITIS C ANTIBODY (EXT): HEPATITIS C VIRUS IGG AB (EXT): NONREACTIVE

## 2023-04-12 LAB — CMP (EXT)
A/G Ratio (EXT): 1.7
ALT/SGPT (EXT): 13 U/L (ref 7–40)
AST/SGOT (EXT): 18 U/L (ref 13–40)
Albumin (EXT): 4.1 g/dL (ref 3.2–4.8)
Alkaline Phosphatase (EXT): 80 U/L (ref 46–145)
Anion Gap (EXT): 8 meq/L (ref 2–12)
BUN (EXT): 11 mg/dL (ref 9–23)
Bilirubin, Total (EXT): 0.4 mg/dL (ref 0.3–1.2)
CO2 (EXT): 26 meq/L (ref 20.0–31.0)
CalciumCalcium (EXT): 10 mg/dL (ref 8.3–10.6)
Chloride (EXT): 105 meq/L (ref 99–113)
Creatinine (EXT): 0.76 mg/dL (ref 0.55–1.02)
GFR Estimated (Calc)GFR Estimated (Calc) (EXT): 89 (ref >60)
Globulin (EXT): 2.4 g/dL (ref 1.6–3.5)
Glucose (EXT): 110 mg/dL — ABNORMAL HIGH (ref 70–99)
Potassium (EXT): 3.3 meq/L — ABNORMAL LOW (ref 3.5–5.5)
Protein (EXT): 6.5 g/dL (ref 5.7–8.2)
Sodium (EXT): 139 meq/L (ref 135–145)

## 2023-04-19 LAB — BMP (EXT)
Anion Gap (EXT): 8 (ref 5–15)
BUN (EXT): 7 mg/dL (ref 7–23)
CO2 (EXT): 28 mmol/L (ref 22–32)
CalciumCalcium (EXT): 10 mg/dL (ref 8.6–10.5)
Chloride (EXT): 104 mmol/L (ref 98–107)
Creatinine (EXT): 0.75 mg/dL (ref 0.50–1.20)
Glucose (EXT): 99 mg/dL (ref 65–99)
Potassium (EXT): 4.3 mmol/L (ref 3.5–5.3)
Sodium (EXT): 140 mmol/L (ref 135–145)
eGFR - Creat CKD-EPI (EXT): >90 mL/min/{1.73_m2} (ref >=60)

## 2023-08-10 ENCOUNTER — Inpatient Hospital Stay: Admit: 2023-08-10 | Payer: PRIVATE HEALTH INSURANCE

## 2023-08-10 DIAGNOSIS — Z1231 Encounter for screening mammogram for malignant neoplasm of breast: Secondary | ICD-10-CM

## 2023-08-20 ENCOUNTER — Institutional Professional Consult (permissible substitution)
Admit: 2023-08-20 | Discharge: 2023-08-20 | Payer: MEDICARE | Attending: Student in an Organized Health Care Education/Training Program

## 2023-08-20 DIAGNOSIS — G379 Demyelinating disease of central nervous system, unspecified: Secondary | ICD-10-CM

## 2023-08-20 NOTE — Progress Notes (Signed)
 [facility]  General Neurology Clinic    Reason for the visit: Sandra Villegas is 63 y.o. right handed adult  who is here for new evaluation and treatment of abnormal MRI brain.    Clinical summary:  Diagnosis: Unknown - pending workup  Diagnosis code: Unknown - pending workup  Disease activity in prior year: Yes    Date of symptom onset: August 2024  Date of diagnosis: Pending workup    Clinical flare history:  Symptom Date (MM/YYYY) Notes   Right facial weakness January 2025 Brief and lasted for few hours, followed by weird feeling on the right side of the face     Treatment history:  Medication Start date (MM/YYYY) Stop date (MM/YYYY) Notes                 History of present illness:  Initial history:  Patient has a history of bipolar 1 disorder, PTSD, anxiety and depression, she is followed by psychiatry and was treated with atypical antipsychotics for many years.  She was previously treated with aripiprazole and lurasidone.  She also has a history of psoriatic arthritis and was followed by rheumatology with negative autoimmune labs.    Around January 2024, patient started having plant based diet and then lost 60 lbs.    During August 2024, she started having pain in left wrist and left fingers, described as sharp and achy, intermittent and last for hours, tends to happens only when stressed, no burning sensation, numbness or tingling. She has urinary and fecal incontinence with no warning so colonoscopy was done and reportedly normal.     Around same time in August 2024, she started having memory issues with forgetfulness of names of her grandchildren, not being able to recognize her church when driving by it. She also noticed poor energy and not being able to hand every day activities.     Around January 2025, she was hospitalized with COVID, she was having palpitations during hospitalization, she then developed brief right facial weakness for few hours and she was  concerned abut stroke.  No head imaging was done during hospitalization.  Her medications for depression were adjusted and duloxetine was switched to citalopram & started on venlafaxine and lurasidone. Afterwards, she continued to have weird feeling in right side of face with muscle twisting and biting lips but no weakness, numbness or tingling.  MRI of the brain was obtained in April 2025 which showed demyelinating lesions.    Patient does not recall having any acute episodes of vision loss, dysphagia or dysarthria, focal weakness in the past.    Interval history:  History was provided by the patient, who is a fair historian. She was unaccompanied to the visit.     Comprehensive review of other neurological symptoms:  Symptom Yes No Description   Cognitive impairment [x]  []  Patient forget recent and upcoming events and appointment, unable to remember without reminders and calendar.  She forget names of her grandchildren but not immediate family members.   She drives a car and has got lost while driving before, she always lose her stuff around the house and find them eventually.   She can cook and follow a recipe, she can use laptop and cellphone with no issues, she is going through financial hardship and filling for bankruptcy.    Sleep impairment  [x]  []  She has difficulty falling asleep and staying asleep.   If sleep is interrupted, with difficulty going back to sleep.  Sleeping aids: zolpidem & hydroxyzine    Depression/Anxiety  [x]  []  Anxiety, depression, Bipolar I, PTSD  Patient is followed by psychiatry Dr. Calista Catching, she was started on lurasidone in April 2025.   Headache []  [x]     Autonomic symptoms []  [x]     Gait issues and falls [x]  []  She feel unsteady when walking on uneven surfaces, and sometimes has difficulty standing up when she fall on the floor.    Visual symptoms, including hallucinations [x]  []  Blurred vision in both eyes   Brainstem symptoms [x]  []  No diplopia. She has globus sensation with  choking but no dysphagia or dysarthria  No facial weakness or numbness or tingling  No tinnitus, hearing impairment or vertigo   Motor symptoms (weakness, spasticity, cramps) [x]  []  She feel akathisia and restless when sitting and sleeping. She feel discomfort in both legs with premonitory symptoms and anxiety if not moving around.    Bowel/bladder issues []  [x]  No urinary or fecal urgency or incontinence    Sensory symptoms (Numbness/paraesthesia) [x]  []  Intermittent numbness and tinging in bilateral fingers    Tremor []  [x]        Medical Review of Systems:  As per above in HPI. A 10-point ROS is otherwise negative.     Past history:  Medical History[1]   Surgical History[2]    Social history:  Social History     Socioeconomic History    Marital status: Single     Spouse name: Not on file    Number of children: Not on file    Years of education: Not on file    Highest education level: Not on file   Occupational History    Not on file   Tobacco Use    Smoking status: Never    Smokeless tobacco: Never   Substance and Sexual Activity    Alcohol use: Yes     Alcohol/week: 1.0 standard drink of alcohol     Types: 1 Glasses of wine per week    Drug use: Never    Sexual activity: Not Currently     Partners: Male     Birth control/protection: Abstinence   Other Topics Concern    Not on file   Social History Narrative    Not on file     Social Determinants of Health     Financial Resource Strain: Low Risk  (11/18/2018)    Received from [facility]    Overall Financial Resource Strain (CARDIA)     Difficulty of Paying Living Expenses: Not hard at all   Food Insecurity: No Food Insecurity (11/18/2018)    Received from [facility]    Hunger Vital Sign     Worried About Running Out of Food in the Last Year: Never true     Ran Out of Food in the Last Year: Never true   Transportation Needs: No Transportation Needs (11/18/2018)    Received from [facility] - Transportation     Lack of Transportation (Medical): No     Lack of  Transportation (Non-Medical): No   Physical Activity: Inactive (11/18/2018)    Received from [facility]    Exercise Vital Sign     Days of Exercise per Week: 0 days     Minutes of Exercise per Session: 0 min   Stress: Stress Concern Present (11/18/2018)    Received from [facility] - Occupational Stress Questionnaire     Feeling of  Stress : To some extent   Social Connections: Moderately Integrated (11/18/2018)    Received from [facility]    Social Connection and Isolation Panel [NHANES]     Frequency of Communication with Friends and Family: More than three times a week     Frequency of Social Gatherings with Friends and Family: More than three times a week     Attends Religious Services: More than 4 times per year     Active Member of [employer] or Organizations: Yes     Attends Engineer, structural: More than 4 times per year     Marital Status: Divorced   Intimate Partner Violence: Not At Risk (11/18/2018)    Received from [facility]    Humiliation, Afraid, Rape, and Kick questionnaire     Fear of Current or Ex-Partner: No     Emotionally Abused: No     Physically Abused: No     Sexually Abused: No   Housing Stability: Not on file      Occupation: On disability for Bipolar  Highest level of education: Associates degree    Family history:   Family history of neurological diseases: Yes - Comment: sister with MS    Allergies:  Patient has no allergy information on record.    Current Medications[3]    Physical examination:  Vital signs: BP 134/68   Pulse 76   Ht 1.626 m   Wt 95.3 kg   BMI 36.05 kg/m    General: No acute distress, well-groomed, appears stated age  Head: Normocephalic, atraumatic.  Neck: Supple  Respiratory: Non labored breathing    Neurological exam:   Mental Status: Awake, oriented to person, place, time. Normal fund of knowledge. Fluent speech and comprehension. Able to name current and last [place]  president. Able to follow complex digiti specific  commands that cross midline. Able to perform complex calculations to $0.4.     Cranial Nerves: Fundoscopy showed no papilledema or optic disc atrophy. VA (corrected): 20/20 OU. Visual fields full to confrontation. PERRL without evidence of APD. EOMI without signs of intranuclear ophthalmoplegia or nystagmus. V1-V3 intact to light touch bilaterally. Face and nasolabial folds symmetric. Hearing intact to finger rub, palate and tongue midline. Shoulder shrug is full strength bilaterally.  Rare nonpurposeful oral buccal facial movements that can be suppressed.    Motor Exam: Bulk is normal. Tone is normal in bilateral upper & lower extremities. There is no evidence of tremor. Akathisia in all limbs. Pronator drift negative.  Strength (R/L), MRC grading 0-5:   Upper extremities: Shoulder abduction 5/5, elbow flexion 5/5, elbow extension 5/5, wrist flexion 5/5, wrist extension 5/5, finger extension 5/5, interossei 5/5  Lower extremities: Hip flexion 5/5, hip abduction 5/5, hip adduction 5/5, knee flexion 5/5, knee extension 5/5, ankle dorsiflexion 5/5, ankle plantar flexion 5/5.    Sensation: Sensation to light touch and pinprick is intact in all extremities. Vibration is mildly impaired in both feet. Proprioception intact in both both feet. Romberg is negative    Coordination: Finger-to-nose shows no signs of dysmetria. Finger-tapping is fast, fluid and synchronized.     Deep tendon reflexes: (R/L): Brachioradialis 3+/3+, Biceps 3+/3+, Triceps 3+/3+, Patellar 3+/3+ with crossed adduction, negative Hoffman sign bilaterally.     Gait: Normal based gait, normal step and stride length, no spasticity, normal cadence, and arm swing. Able to tandem walk.    EDSS: 2.5 - Minimal disability in two FS (two FS grade 2,others 0 or 1).  Pyramidal Functions: 2 -  Minimal disability: patient complains of motor-fatigability or reduced performance in strenuous motor tasks (BMRC grade 4 in one or two muscle groups)  Cerebellar Functions: 0  - Normal  Sensory Functions: 0 - Normal  Brainstem Functions: 0 - Normal  Bowel & Bladder Functions: 0 - Normal  Visual Functions: 0 - Normal  Cerebral (Cognitive) Functions: 2 - Mild decrease in mentation; moderate or severe fatigue     Test 08/20/2023 Date (MM/YYYY) Date (MM/YYYY) Date (MM/YYYY)   EDSS 2.5      SDMT 41      9-hole peg test Rt 22s, Lt 23s      25 feet walk test 4.5s          Relevant laboratory and imaging data (my direct review):  CSF studies:  CSF protein:  CSF Glucose  CSF WBCs (tube 4)  Oligoclonal bands:   Others:    Serum studies:  Test Positive Negative Not done Date (MM/YYYY) Notes   NMO antibody []  []  []      MOG antibody []  []  []      Autoimmune/paraneoplastic []  []  []      Serum IL2R []  []  []      Sjogren antibodies []  []  []      ANCA []  []  []      ANA []  []  []        Test Positive Negative Indeterminate Date (MM/YYYY) Notes   JCV antibody []  []  []      TB Quantiferon []  []  []      Hepatitis panel []  []  []      HSV antibodies []  []  []      VZV antibodies []  []  []      EBV panel []  []  []      Syphilis screen []  []  []      Lyme screen []  []  []        Test Level Date (MM/YYYY) Notes   Vitamin D level      Vitamin B12 level      Plasma NFL      IgG        Imaging:  MRI brain WO 08/08/2023:  Several periventricular round/ovoid FLAIR hyperintensity in perivenular distribution that are consistent with CNS demyelinating disease, there are also scattered deep white matter FLAIR hyperintensity but no juxtacortical or infratentorial lesions.    Other studies:  None    Assessment:  DRESDEN BOKHARI is 63 y.o.  right handed female who is being followed for CNS demyelinating disease.  -Review of MRI brain showed several periventricular FLAIR hyperintensity that are round/ovoid with perivenular distribution that are characteristics of CNS demyelinating disease.  There are also deep white matter FLAIR hyperintensity that are round-ovoid with no corpus callosum lesions or infratentorial lesions.  Main differential  diagnosis for these lesions is either multiple sclerosis, Sjogren syndrome or Behcet's disease.  - Patient does not meets 2017 McDonald criteria for relapsing remitting or primary progressive multiple sclerosis.  Although patient has typical clinical presentation (right facial weakness), symptoms were brief and no objective evidence of demyelination.  Although patient has behavioral health issues, no evidence of disability progression over the years.  Patient does not meet dissemination in space or time.   - Prior workup for mimics was not done.  Onset of multiple sclerosis after the age of 24 is a red flag about the diagnosis.    Patient will need further workup including repeating MRI brain with and without contrast to look for any evidence of contrast-enhancement to look for evidence of dissemination in time, patient will also need  MRI cervical and thoracic spine with and without contrast to look for any evidence of dissemination in space.   - Patient will also need extensive serum workup to rule out mimics and differentials.    - Patient will need LP and CSF analysis to confirm the diagnosis.    Plan:  - Imaging: MRI brain ICOMETRIX W/WO, MRI cervical and thoracic spine W/WO for evaluation of CNS demyelinating disease including pattern, distribution of lesions and any contrast-enhancement  - Labs:  - Ruling out mimics: NMO and MOG antibodies, IL-2 R, ANA profile 12, ANCA screen, autoimmune neurology panel  - Metabolic: Vitamin D level, vitamin B12 level, MMA, homocystine  - Infectious: EBV panel, Lyme screen, syphilis screen  - Others: Multiple sclerosis monitoring profile (plasma NFL, GFAP)  - Plan for LP and CSF analysis (cell count in tube 4, basic profile, IgG index, oligoclonal bands, MOG antibody, autoimmune/paraneoplastic panel, CSF VDRL, CSF Lyme antibodies, myelin basic protein)  - Follow-up with neurology in 3-4 months    Thank you for allowing me to participate in Sandra Villegas care. Please reach  out to me or my office if any questions or concerns.    Sincerely,  Cathlean Co, MD  Neurologist, Neuroimmunologist, Neuroinfectious Diseases Specialist  [facility] [place] [facility]  [facility], [facility]   Work Phone: [phone]  Work Fax: [phone]    Dictation Software was used to dictate this note and, despite all efforts to proofread, some dictation related unintentional errors or typos may occur. If you have any questions as the referring provider colleague, please do not hesitate to contact our office. If you are the patient reading this chart and reviewing the medical notes, please note that medical documentation is often written with abbreviations in medical terminology. ?Physician documentation is typically written by physicians for other healthcare providers' colleagues to review in order to efficiently and briefly summarize the clinical encounters, tests that were ordered and interpreted; and the diagnosis in the most efficient and effective way possible.     A total of 69 min were spent to for medical decision making and counseling. Medical Decision Making involved reviewing relevant Medical records (as available), relevant Lab results (if accessible), relevant Radiology results & images (if accessible), relevant Procedures and diagnostic tests (as applicable), independent visualization of relevant neurologic testing(as needed), all questions answered to my best up-to-date Knowledge and understanding, test discussed with the performing provider (if applicable), relevant Medical records ordered (if needed). Counseling involved counseled regarding diagnostic results, instructions for management, risk factor reductions, prognosis, patient education, impressions, importance of compliance with treatment and risks and benefits of treatment options, as indicated. Counseled regarding Tobacco/EtoH cessation and proper use and storage of opioid  medications (if indicated and relevant), counseled regarding importance of compliance with treatment and risks and benefits of treatment options. Additional time was spent in additional diagnostic and management research, and relevant up-to-date info, and medical record review, as needed. Prescription Drug Monitoring Program Report: MD or Assigned Delegate Reviewed patient's report as needed prior to prescribing Schedule II, III and IV medications that required review by law. Additional time was spent in care coordination including consultation with peers, staff, schedulers, nursing, and phone collaboration; as needed           [1]   Past Medical History:  Diagnosis Date    Anxiety     Brain concussion     Dementia (Multi-HCC)     Depression  Fibrocystic breast     Fibromyalgia, primary     Memory loss     MS (multiple sclerosis) (Multi-HCC)     Stroke (Multi-HCC)    [2]   Past Surgical History:  Procedure Laterality Date    BREAST BIOPSY      KYPHOSIS SURGERY  1987   [3]   Current Outpatient Medications   Medication Sig Dispense Refill    acetaminophen 500 mg capsule Take 1 tablet by mouth if needed.      citalopram (CeleXA) 40 mg tablet Take 40 mg by mouth once daily.      clindamycin (Cleocin T) 1 % lotion Apply topically twice daily.      fluticasone propionate (FLONASE NASL) Administer 2 sprays into affected nostril(s) in the morning.      gabapentin (Neurontin) 100 mg capsule Take 100 mg by mouth three times daily.      hydrOXYzine HCL (Atarax) 25 mg tablet Take 25 mg by mouth if needed for itching.      multivit-min-FA-lycopen-lutein (Centrum Silver) 0.4 mg-300 mcg- 250 mcg tablet Take 1 tablet by mouth once daily.      venlafaxine XR (Effexor-XR) 150 mg 24 hr capsule Take 1 capsule by mouth in the morning.      zolpidem CR (Ambien CR) 12.5 mg ER tablet Take 5 mg by mouth in the morning.       No current facility-administered medications for this visit.

## 2023-08-21 LAB — LYME DISEASE SEROLOGY W/REFLEX: Lyme Total Antibody CIA: NEGATIVE

## 2023-08-21 LAB — SYPHILIS AB CASCADING REFLEX (FIRST LINE SCREENING TEST): T pallidum Abs: NONREACTIVE

## 2023-08-21 LAB — EPSTEIN-BARR VIRUS ANTIBODY PANEL
EBV Nuc Ag IgG: >600 U/mL — ABNORMAL HIGH (ref 0.0–17.9)
EBV VCA IgG  Ab: >600 U/mL — ABNORMAL HIGH (ref 0.0–17.9)
EBV VCA IgM  Ab: <36 U/mL (ref 0.0–35.9)

## 2023-08-21 LAB — VITAMIN D 25 HYDROXY: Vitamin D, 25-Hydroxy: 25 ng/mL — ABNORMAL LOW (ref 30.0–100.0)

## 2023-08-21 LAB — VITAMIN B12: Vitamin B12: 545 pg/mL (ref 232–1245)

## 2023-08-21 LAB — HOMOCYSTEINE: Homocyst(e)ine: 13.3 umol/L (ref 0.0–17.2)

## 2023-08-21 LAB — FOLATE: Folate: 14 ng/mL (ref >3.0)

## 2023-08-22 LAB — PROTEIN, TOTAL AND PROTEIN ELECTROPHORESIS WITH IMMUNOFIXATION
A/G Ratio: 1.6 (ref 0.7–1.7)
Albumin: 3.9 g/dL (ref 2.9–4.4)
Alpha-1-Globulin: 0.2 g/dL (ref 0.0–0.4)
Alpha-2-Globulin: 0.6 g/dL (ref 0.4–1.0)
Beta Globulin: 0.9 g/dL (ref 0.7–1.3)
Gamma Globulin: 0.8 g/dL (ref 0.4–1.8)
Globulin, Total: 2.5 g/dL (ref 2.2–3.9)
IgA: 170 mg/dL (ref 87–352)
IgG: 908 mg/dL (ref 586–1602)
IgM: 63 mg/dL (ref 26–217)
Protein Total: 6.4 g/dL (ref 6.0–8.5)

## 2023-08-22 LAB — NEUROMYELITIS OPTICA (NMO)/AQUAPORIN-4-IGG, FACS,S: AQP4 Antibody, Cell-based IFA: NEGATIVE

## 2023-08-22 LAB — MYELIN OLIGODENDROCTYE GLYCOPROTEIN (MOG-IGG1): MOG Ab CBA IFA: NEGATIVE

## 2023-08-22 LAB — ANCA SCREEN WITH MPO AND PR3 WITH REFLEX TO ANCA TITER
Anti-MPO Antibodies: <0.2 U (ref 0.0–0.9)
Anti-PR3 Antibodies: <0.2 U (ref 0.0–0.9)
Atypical pANCA: <1:20 {titer}
Cytoplas C-ANCA: <1:20 {titer}
Perinuc P-ANCA: <1:20 {titer}

## 2023-08-23 LAB — INTERLEUKIN 2 RECEP ALPHA (IL-2R/CD25), SOLUBLE: IL-2R Alpha: 222 U/mL — ABNORMAL LOW (ref 223–710)

## 2023-08-24 LAB — METHYLMALONIC ACID, SERUM: Methylmalonic Acid: 220 nmol/L (ref 0–378)

## 2023-08-28 LAB — ANA PROFILE 12 (RDL): Anti-Nuclear Ab by IFA (RDL): NEGATIVE

## 2023-08-28 LAB — ANTI-RO/NEG ANA (REFLEX ONLY): Anti-Ro (SS-A) Ab (RDL): <20 U (ref <20)

## 2023-09-10 ENCOUNTER — Ambulatory Visit: Payer: MEDICARE

## 2023-09-12 LAB — LABCORP MISCELLANEOUS

## 2023-09-20 ENCOUNTER — Ambulatory Visit: Payer: MEDICARE

## 2023-09-21 ENCOUNTER — Ambulatory Visit
Admit: 2023-09-21 | Discharge: 2023-09-21 | Payer: MEDICARE | Attending: Student in an Organized Health Care Education/Training Program

## 2023-09-21 DIAGNOSIS — G35 Multiple sclerosis: Secondary | ICD-10-CM

## 2023-09-21 MED ORDER — cholecalciferol, vitamin D3, (Vitamin D3) 50 mcg (2,000 unit) capsule
50 | ORAL_CAPSULE | Freq: Every day | ORAL | 1 refills | 47.50000 days | Status: DC
Start: 2023-09-21 — End: 2023-09-28

## 2023-09-21 MED ORDER — thiamine (Vitamin B-1) 50 mg tablet
50 | ORAL_TABLET | Freq: Every day | ORAL | 11 refills | 30.00000 days | Status: DC
Start: 2023-09-21 — End: 2023-09-28

## 2023-09-21 NOTE — Progress Notes (Signed)
 [facility]  General Neurology Clinic    Reason for the visit: Sandra Villegas is 63 y.o. right handed adult  who is here for follow up of abnormal MRI brain.    Clinical summary:  Diagnosis: Relapsing Remitting Multiple Sclerosis (RRMS)  Diagnosis code: 1 - RRMS  Disease activity in prior year: Yes    Date of symptom onset: August 2024  Date of diagnosis: Pending workup    Clinical flare history:  Symptom Date (MM/YYYY) Notes   Right facial weakness January 2025 Brief and lasted for few hours, followed by weird feeling on the right side of the face     Treatment history:  Medication Start date (MM/YYYY) Stop date (MM/YYYY) Notes   Ublituximab (Briumvi) Pending auth       History of present illness:  Initial history:  Patient has a history of bipolar 1 disorder, PTSD, anxiety and depression, she is followed by psychiatry and was treated with atypical antipsychotics for many years.  She was previously treated with aripiprazole and lurasidone.  She also has a history of psoriatic arthritis and was followed by rheumatology with negative autoimmune labs.    Around January 2024, patient started having plant based diet and then lost 60 lbs.    During August 2024, she started having pain in left wrist and left fingers, described as sharp and achy, intermittent and last for hours, tends to happens only when stressed, no burning sensation, numbness or tingling. She has urinary and fecal incontinence with no warning so colonoscopy was done and reportedly normal.     Around same time in August 2024, she started having memory issues with forgetfulness of names of her grandchildren, not being able to recognize her church when driving by it. She also noticed poor energy and not being able to hand every day activities.     Around January 2025, she was hospitalized with COVID, she was having palpitations during hospitalization, she then developed brief right facial weakness for few  hours and she was concerned abut stroke.  No head imaging was done during hospitalization.  Her medications for depression were adjusted and duloxetine was switched to citalopram & started on venlafaxine and lurasidone. Afterwards, she continued to have weird feeling in right side of face with muscle twisting and biting lips but no weakness, numbness or tingling.  MRI of the brain was obtained in April 2025 which showed demyelinating lesions.    Patient does not recall having any acute episodes of vision loss, dysphagia or dysarthria, focal weakness in the past.    Interval history:  History was provided by the patient, who is a fair historian. She was unaccompanied to the visit.   - MRI cervical and thoracic spine showed 2 lesions at C2-C3 and T3-T4 that are classical for multiple sclerosis with no active enhancement.    Comprehensive review of other neurological symptoms:  Symptom Yes No Description   Cognitive impairment [x]  []  Patient forget recent and upcoming events and appointment, unable to remember without reminders and calendar.  She forget names of her grandchildren but not immediate family members.   She drives a car and has got lost while driving before, she always lose her stuff around the house and find them eventually.   She can cook and follow a recipe, she can use laptop and cellphone with no issues, she is going through financial hardship and filling for bankruptcy.    Sleep impairment  [x]  []  She has difficulty  falling asleep and staying asleep.   If sleep is interrupted, with difficulty going back to sleep.  Sleeping aids: zolpidem & hydroxyzine    Depression/Anxiety  [x]  []  Anxiety, depression, Bipolar I, PTSD  Patient is followed by psychiatry Dr. Calista Catching, she was started on lurasidone in April 2025.   Headache []  [x]     Autonomic symptoms []  [x]     Gait issues and falls [x]  []  She feel unsteady when walking on uneven surfaces, and sometimes has difficulty standing up when she fall on the  floor.    Visual symptoms, including hallucinations [x]  []  Blurred vision in both eyes   Brainstem symptoms [x]  []  No diplopia. She has globus sensation with choking but no dysphagia or dysarthria  No facial weakness or numbness or tingling  No tinnitus, hearing impairment or vertigo   Motor symptoms (weakness, spasticity, cramps) [x]  []  She feel akathisia and restless when sitting and sleeping. She feel discomfort in both legs with premonitory symptoms and anxiety if not moving around.    Bowel/bladder issues []  [x]  No urinary or fecal urgency or incontinence    Sensory symptoms (Numbness/paraesthesia) [x]  []  Intermittent numbness and tinging in bilateral fingers    Tremor []  [x]        Medical Review of Systems:  As per above in HPI. A 10-point ROS is otherwise negative.     Past history:  Medical History[1]   Surgical History[2]    Social history:  Social History     Socioeconomic History    Marital status: Single     Spouse name: Not on file    Number of children: Not on file    Years of education: Not on file    Highest education level: Not on file   Occupational History    Not on file   Tobacco Use    Smoking status: Never    Smokeless tobacco: Never   Substance and Sexual Activity    Alcohol use: Yes     Alcohol/week: 1.0 standard drink of alcohol     Types: 1 Glasses of wine per week    Drug use: Never    Sexual activity: Not Currently     Partners: Male     Birth control/protection: Abstinence   Other Topics Concern    Not on file   Social History Narrative    Not on file     Social Determinants of Health     Financial Resource Strain: Low Risk  (11/18/2018)    Received from [facility]    Overall Financial Resource Strain (CARDIA)     Difficulty of Paying Living Expenses: Not hard at all   Food Insecurity: No Food Insecurity (11/18/2018)    Received from [facility]    Hunger Vital Sign     Worried About Running Out of Food in the Last Year: Never true     Ran Out of Food in the Last Year: Never true    Transportation Needs: No Transportation Needs (11/18/2018)    Received from [facility] - Transportation     Lack of Transportation (Medical): No     Lack of Transportation (Non-Medical): No   Physical Activity: Inactive (11/18/2018)    Received from [facility]    Exercise Vital Sign     Days of Exercise per Week: 0 days     Minutes of Exercise per Session: 0 min   Stress: Stress Concern Present (11/18/2018)    Received from [facility]  [employer] [facility] - Occupational Stress Questionnaire     Feeling of Stress : To some extent   Social Connections: Moderately Integrated (11/18/2018)    Received from [facility]    Social Connection and Isolation Panel [NHANES]     Frequency of Communication with Friends and Family: More than three times a week     Frequency of Social Gatherings with Friends and Family: More than three times a week     Attends Religious Services: More than 4 times per year     Active Member of [employer] or Organizations: Yes     Attends Engineer, structural: More than 4 times per year     Marital Status: Divorced   Intimate Partner Violence: Not At Risk (11/18/2018)    Received from [facility]    Humiliation, Afraid, Rape, and Kick questionnaire     Fear of Current or Ex-Partner: No     Emotionally Abused: No     Physically Abused: No     Sexually Abused: No   Housing Stability: Not on file      Occupation: On disability for Bipolar  Highest level of education: Associates degree    Family history:   Family history of neurological diseases: Yes - Comment: sister with MS    Allergies:  Shellfish containing products, Doxycycline, Risperidone, Strawberry, and Latex    Current Medications[3]    Physical examination:  Vital signs: BP 130/74   Pulse 100   Ht 1.626 m   Wt 93 kg   BMI 35.19 kg/m    General: No acute distress, well-groomed, appears stated age  Head: Normocephalic, atraumatic.  Neck: Supple  Respiratory: Non labored breathing    Neurological exam:    Mental Status: Awake, oriented to person, place, time. Normal fund of knowledge. Fluent speech and comprehension. Able to name current and last [place]  president. Able to follow complex digiti specific commands that cross midline. Able to perform complex calculations to $0.4.     Cranial Nerves: Fundoscopy showed no papilledema or optic disc atrophy. VA (corrected): 20/20 OU. Visual fields full to confrontation. PERRL without evidence of APD. EOMI without signs of intranuclear ophthalmoplegia or nystagmus. V1-V3 intact to light touch bilaterally. Face and nasolabial folds symmetric. Hearing intact to finger rub, palate and tongue midline. Shoulder shrug is full strength bilaterally.  Rare nonpurposeful oral buccal facial movements that can be suppressed.    Motor Exam: Bulk is normal. Tone is normal in bilateral upper & lower extremities. There is no evidence of tremor. Akathisia in all limbs. Pronator drift negative.  Strength (R/L), MRC grading 0-5:   Upper extremities: Shoulder abduction 5/5, elbow flexion 5/5, elbow extension 5/5, wrist flexion 5/5, wrist extension 5/5, finger extension 5/5, interossei 5/5  Lower extremities: Hip flexion 5/5, hip abduction 5/5, hip adduction 5/5, knee flexion 5/5, knee extension 5/5, ankle dorsiflexion 5/5, ankle plantar flexion 5/5.    Sensation: Sensation to light touch and pinprick is intact in all extremities. Vibration is mildly impaired in both feet. Proprioception intact in both both feet. Romberg is negative    Coordination: Finger-to-nose shows no signs of dysmetria. Finger-tapping is fast, fluid and synchronized.     Deep tendon reflexes: (R/L): Brachioradialis 3+/3+, Biceps 3+/3+, Triceps 3+/3+, Patellar 3+/3+ with crossed adduction, negative Hoffman sign bilaterally.     Gait: Normal based gait, normal step and stride length, no spasticity, normal cadence, and arm swing. Able to tandem walk.    EDSS: 2.5 - Minimal  disability in two FS (two FS grade 2,others 0 or  1).  Pyramidal Functions: 2 - Minimal disability: patient complains of motor-fatigability or reduced performance in strenuous motor tasks (BMRC grade 4 in one or two muscle groups)  Cerebellar Functions: 0 - Normal  Sensory Functions: 0 - Normal  Brainstem Functions: 0 - Normal  Bowel & Bladder Functions: 0 - Normal  Visual Functions: 0 - Normal  Cerebral (Cognitive) Functions: 2 - Mild decrease in mentation; moderate or severe fatigue     Test 08/20/2023 Date (MM/YYYY) Date (MM/YYYY) Date (MM/YYYY)   EDSS 2.5      SDMT 41      9-hole peg test Rt 22s, Lt 23s      25 feet walk test 4.5s          Relevant laboratory and imaging data (my direct review):  CSF studies:  CSF protein:  CSF Glucose  CSF WBCs (tube 4)  Oligoclonal bands:   Others:    Serum studies:  Test Positive Negative Not done Date (MM/YYYY) Notes   NMO antibody []  [x]  []  May 2025    MOG antibody []  [x]  []  May 2025    Autoimmune/paraneoplastic []  [x]  []  May 2025    Serum IL2R []  [x]  []  May 2025 Low level (222)   Sjogren antibodies []  [x]  []  May 2025    ANCA []  [x]  []  May 2025    ANA []  [x]  []  May 2025      Test Positive Negative Indeterminate Date (MM/YYYY) Notes   JCV antibody []  []  []      TB Quantiferon []  []  []      Hepatitis panel []  []  []      HSV antibodies []  []  []      VZV antibodies []  []  []      EBV panel [x]  []  []  May 2025 Prior infection   Syphilis screen []  [x]  []  May 2025    Lyme screen []  [x]  []  May 2025      Test Level Date (MM/YYYY) Notes   Vitamin D level 09 Sep 2023    Vitamin B12 level 554 May 2025 Normal MMA and homocystine   Plasma NFL      IgG 908 May 2025      Imaging:  MRI brain WO 08/08/2023:  Several periventricular round/ovoid FLAIR hyperintensity in perivenular distribution that are consistent with CNS demyelinating disease, there are also scattered deep white matter FLAIR hyperintensity but no juxtacortical or infratentorial lesions.    MRI cervical spine & thoracic spine W/WO 09/14/2023:  Small nonenhancing cord lesions at the  C2-C3 and T3-T4 level. There is subtle heterogeneity of the remainder of the cord, though it is unclear whether these reflect tiny lesions or motion artifact.     Other studies:  None    Assessment:  Sandra Villegas is 63 y.o.  right handed female who is being followed for CNS demyelinating disease.  - Review of MRI brain showed several periventricular FLAIR hyperintensity that are round/ovoid with perivenular distribution that are characteristics of CNS demyelinating disease.  There are also deep white matter FLAIR hyperintensity that are round-ovoid with no corpus callosum lesions or infratentorial lesions.  Main differential diagnosis for these lesions is either multiple sclerosis, Sjogren syndrome or Behcet's disease.  - Review of MRI cervical spine showed peripheral eccentric demyelinating lesion that are typical for multiple sclerosis.  - Patient does not fully meets 2017 McDonald criteria for relapsing remitting multiple sclerosis.  Although patient has typical clinical presentation (right facial  weakness), symptoms were brief and no objective evidence of demyelination.  Although patient has behavioral health issues, no evidence of disability progression over the years.  Patient needs dissemination in space (periventricular, spinal cord lesions), but does not meet dissemination in time.  - Prior workup for mimics showed negative Sjogren antibodies, NMO and MOG antibodies.  Onset of multiple sclerosis after the age of 77 is a red flag about the diagnosis.    Patient will need further workup including LP and CSF analysis to look for evidence of dissemination in time to support the diagnosis, also to rule out other mimics and differentials.    Patient will benefit from starting high efficacy disease modifying therapy given spinal cord lesions, B-cell depletion therapy will be preferred treatment options since they can help prevent further relapses as well as decrease risk of progression.    Plan:  - DMT:  Submit Briumvi for prior authorization, 150-450 mg followed by 450 mg every 6 months  - Imaging: Repeat MRI brain ICOMETRIX WO, MRI cervical and thoracic spine WO 6 months after starting DMT.  - Labs:  - Prior to B-cell suppression therapy: Hepatitis panel, TB QuantiFERON, VZV panel  - Others: Multiple sclerosis monitoring profile (plasma level, GFAP), immunoglobulin panel  - Plan for LP and CSF analysis (cell count in tube 4, basic profile, IgG index, oligoclonal bands, MOG antibody, autoimmune/paraneoplastic panel, CSF VDRL, CSF Lyme antibodies, myelin basic protein).  - Ambulatory referral to physical therapy to help with balance training and strengthening exercises including aquatic therapy  - Follow-up with neurology in 3 months    Supportive treatment:  - Vitamin D supplementation 2000 units daily and vitamin B12 supplementation 1000 mcg daily.   - Patient can use CoQ10 400 mg daily and alpha lipoic acid 600 mg twice daily, they have some benefit in metabolic supplementation to decrease risk of progression   - Patient should be aware about Uhthoff phenomenon during summer months.  - Patients with multiple sclerosis can use medical marijuana for pain and anxiety, it is recommended to use products that are third party tested.  - It is recommended for patients with multiple sclerosis to have regular exercise and eating well balanced mediterranean diet     Thank you for allowing me to participate in Lena Qualia care. Please reach out to me or my office if any questions or concerns.    Sincerely,  Cathlean Co, MD  Neurologist, Neuroimmunologist, Neuroinfectious Diseases Specialist  [facility]  [facility], [facility]   Work Phone: [phone]  Work Fax: [phone]    Dictation Software was used to dictate this note and, despite all efforts to proofread, some dictation related unintentional errors or typos may occur. If you have any  questions as the referring provider colleague, please do not hesitate to contact our office. If you are the patient reading this chart and reviewing the medical notes, please note that medical documentation is often written with abbreviations in medical terminology. ?Physician documentation is typically written by physicians for other healthcare providers' colleagues to review in order to efficiently and briefly summarize the clinical encounters, tests that were ordered and interpreted; and the diagnosis in the most efficient and effective way possible.     A total of 36 min were spent to for medical decision making and counseling. Medical Decision Making involved reviewing relevant Medical records (as available), relevant Lab results (if accessible), relevant Radiology results & images (if  accessible), relevant Procedures and diagnostic tests (as applicable), independent visualization of relevant neurologic testing(as needed), all questions answered to my best up-to-date Knowledge and understanding, test discussed with the performing provider (if applicable), relevant Medical records ordered (if needed). Counseling involved counseled regarding diagnostic results, instructions for management, risk factor reductions, prognosis, patient education, impressions, importance of compliance with treatment and risks and benefits of treatment options, as indicated. Counseled regarding Tobacco/EtoH cessation and proper use and storage of opioid medications (if indicated and relevant), counseled regarding importance of compliance with treatment and risks and benefits of treatment options. Additional time was spent in additional diagnostic and management research, and relevant up-to-date info, and medical record review, as needed. Prescription Drug Monitoring Program Report: MD or Assigned Delegate Reviewed patient's report as needed prior to prescribing Schedule II, III and IV medications that required review by law. Additional  time was spent in care coordination including consultation with peers, staff, schedulers, nursing, and phone collaboration; as needed       [1]   Past Medical History:  Diagnosis Date    Anxiety     Brain concussion     Dementia (Multi-HCC)     Depression      Fibrocystic breast     Fibromyalgia, primary     Memory loss     MS (multiple sclerosis) (Multi-HCC)     Stroke (Multi-HCC)    [2]   Past Surgical History:  Procedure Laterality Date    BREAST BIOPSY      KYPHOSIS SURGERY  1987   [3]   Current Outpatient Medications   Medication Sig Dispense Refill    acetaminophen 500 mg capsule Take 1 tablet by mouth if needed.      clindamycin (Cleocin T) 1 % lotion Apply topically twice daily.      fluticasone propionate (FLONASE NASL) Administer 2 sprays into affected nostril(s) in the morning.      gabapentin (Neurontin) 100 mg capsule Take 100 mg by mouth three times daily.      hydrOXYzine HCL (Atarax) 25 mg tablet Take 25 mg by mouth if needed for itching.      ketoconazole (NIZOral) 2 % cream Apply topically once daily.      lamoTRIgine (LaMICtal) 100 mg tablet Take 0.5 tablets by mouth twice daily.      multivit-min-FA-lycopen-lutein (Centrum Silver) 0.4 mg-300 mcg- 250 mcg tablet Take 1 tablet by mouth once daily.      venlafaxine XR (Effexor-XR) 150 mg 24 hr capsule Take 1 capsule by mouth in the morning.      zolpidem CR (Ambien CR) 12.5 mg ER tablet Take 5 mg by mouth in the morning.      citalopram (CeleXA) 40 mg tablet Take 40 mg by mouth once daily. (Patient not taking: Reported on 09/21/2023)       No current facility-administered medications for this visit.

## 2023-09-22 LAB — VARICELLA ZOSTER ABS, IGG/IGM
V Zoster IgG: REACTIVE
V Zoster IgM: <0.91 {index} (ref 0.00–0.90)

## 2023-09-22 LAB — CBC W/DIFF
Baso Abs: 0.1 10*3/uL (ref 0.0–0.2)
Basos: 1 %
Eos Abs: 0.3 10*3/uL (ref 0.0–0.4)
Eos: 4 %
Hct: 36.5 % (ref 34.0–46.6)
Hgb: 11.8 g/dL (ref 11.1–15.9)
Immature Grans Abs: 0 10*3/uL (ref 0.0–0.1)
Immature Granulocytes: 1 %
Lymphs Abs: 1.6 10*3/uL (ref 0.7–3.1)
Lymphs: 28 %
MCH: 26.7 pg (ref 26.6–33.0)
MCHC: 32.3 g/dL (ref 31.5–35.7)
MCV: 83 fL (ref 79–97)
Monocytes: 8 %
MonocytesAbs: 0.5 10*3/uL (ref 0.1–0.9)
Neutrophils Abs: 3.4 10*3/uL (ref 1.4–7.0)
Neutrophils: 58 %
Platelets: 242 10*3/uL (ref 150–450)
RBC: 4.42 x10E6/uL (ref 3.77–5.28)
RDW: 13.3 % (ref 11.7–15.4)
WBC: 5.9 10*3/uL (ref 3.4–10.8)

## 2023-09-22 LAB — PROTIME-INR
INR: 1 (ref 0.9–1.2)
Prothrombin Time: 10.9 s (ref 9.1–12.0)

## 2023-09-24 LAB — COMPREHENSIVE METABOLIC PANEL
ALT: 16 IU/L (ref 0–32)
AST: 22 IU/L (ref 0–40)
Albumin: 4.5 g/dL (ref 3.9–4.9)
Alk Phosphatase: 72 IU/L (ref 44–121)
Anion Gap: 13 mmol/L (ref 10.0–18.0)
BUN/Creat Ratio: 23 (ref 12–28)
BUN: 16 mg/dL (ref 8–27)
Bili Total: <0.2 mg/dL (ref 0.0–1.2)
Calcium: 9.6 mg/dL (ref 8.7–10.3)
Carbon Dioxide: 22 mmol/L (ref 20–29)
Chloride: 107 mmol/L — ABNORMAL HIGH (ref 96–106)
Creat: 0.7 mg/dL (ref 0.57–1.00)
Globulin Total: 2 g/dL (ref 1.5–4.5)
Glucose: 83 mg/dL (ref 70–99)
Potassium: 4 mmol/L (ref 3.5–5.2)
Protein Total: 6.5 g/dL (ref 6.0–8.5)
Sodium: 142 mmol/L (ref 134–144)
eGFR: 98 mL/min/{1.73_m2} (ref >59)

## 2023-09-24 LAB — HEPATITIS B CORE ANTIBODY, TOTAL: Hep B Core Total Ab: NEGATIVE

## 2023-09-24 LAB — HCV AB W/REFLEX QUANT PCR (SCREENING): HCV Ab: NONREACTIVE

## 2023-09-24 LAB — IGG, IGA, IGM
IgA: 163 mg/dL (ref 87–352)
IgG: 876 mg/dL (ref 586–1602)
IgM: 59 mg/dL (ref 26–217)

## 2023-09-24 LAB — HCV QN INTERP (REFLEX)

## 2023-09-24 LAB — HEPATITIS B SURFACE ANTIGEN: Hep B Surf Ag Scr: NEGATIVE

## 2023-09-24 LAB — LABCORP MISCELLANEOUS

## 2023-09-24 LAB — HEPATITIS B CORE ANTIBODY, IGM: Hep B Core IgM Ab: NEGATIVE

## 2023-09-25 LAB — QFT-TB GOLD PLUS (REFLEX ONLY)
QFT Mitogen Value: >10 [IU]/mL
QFT Nil Value: 0.09 [IU]/mL
QFT TB1 Ag Value: 0.07 [IU]/mL
QFT TB2 Ag Value: 0.09 [IU]/mL

## 2023-09-25 LAB — QUANTIFERON(R)-TB GOLD PLUS, 4T: QFT-TB Gold Plus: NEGATIVE

## 2023-09-26 NOTE — Telephone Encounter (Signed)
 Duplicate

## 2023-09-28 ENCOUNTER — Ambulatory Visit: Admit: 2023-09-28 | Payer: MEDICARE

## 2023-09-28 ENCOUNTER — Ambulatory Visit
Admit: 2023-09-28 | Discharge: 2023-09-28 | Payer: MEDICARE | Attending: Student in an Organized Health Care Education/Training Program

## 2023-09-28 DIAGNOSIS — G379 Demyelinating disease of central nervous system, unspecified: Secondary | ICD-10-CM

## 2023-09-28 LAB — CSF CELL COUNT WITH DIFFERENTIAL
RBC Automated, CSF: <1000 {cells}/uL — ABNORMAL HIGH (ref 0–0)
TNC Automated, CSF: 1 {cells}/uL (ref 0–4)

## 2023-09-28 MED ORDER — cholecalciferol, vitamin D3, (Vitamin D3) 50 mcg (2,000 unit) capsule
50 | ORAL_CAPSULE | Freq: Every day | ORAL | 0 refills | 47.50000 days | Status: AC
Start: 2023-09-28 — End: 2023-12-27

## 2023-09-28 MED ORDER — thiamine (Vitamin B-1) 50 mg tablet
50 | ORAL_TABLET | Freq: Every day | ORAL | 3 refills | 30.00000 days | Status: AC
Start: 2023-09-28 — End: 2024-09-27

## 2023-09-28 NOTE — Progress Notes (Signed)
 Lumbar Puncture Procedure Note  Consent:    During the informed consent discussion regarding the procedure, or treatment, I explained the following to the patient/designee:  Lysle of the procedure or treatment and who will perform the procedure or treatment.  Necessity for procedure and the possible benefits.  Risks and complications (most common and serious).  Alternative treatments and the risks, benefits and side effects of each (including no treatment).  Likelihood of the patient achieving his/her goals without this procedure and surgery treatment.  Problems that might occur during the recuperation.  Conflicts of interest, if any      Procedure summary:   A time-out was performed. My hands were washed immediately prior to the  procedure. I wore a protective gear, sterile  gown and sterile gloves throughout the procedure. The patient was placed in Left decubitus in a semi-fetal position. The area was cleansed and draped in usual sterile fashion using betadine or chlorhexidine scrub. Local anesthesia was achieved with 5 mL of 1% lidocaine without epinephrine. A 22-gauge 3.5-inch spinal needle was placed in the (L4-L5) lumbar interspace. On the 3rd attempt, clear colored cerebral spinal fluid was obtained. The opening pressure was not measured. 15 mL of CSF was collected into 4 tubes. These were sent for the ordered tests attached to this encounter.    A sterile bandaid was placed over the puncture site. The patient had no immediate complications and tolerated the procedure well.  Estimated  blood loss was 1 mL.    Patient was counseled about post-LP headache and how to minimize it. Patient was advised to report any persistent headache, low back pain, leg tingling, numbness or weakness.    Sincerely,  Toya Daria Spears, MD  Neurologist, Neuroimmunologist, Multiple Sclerosis Specialist

## 2023-09-28 NOTE — Telephone Encounter (Signed)
 Dr Daria gburi , we need to verify her Thiame script , those are too many pills to take at once. Take 10 tablets (500 mg) by mouth once daily.  You can order 500 mg once a day.

## 2023-09-29 LAB — GLUCOSE, CSF: Glucose, CSF: 67 mg/dL (ref 40–75)

## 2023-09-29 LAB — PROTEIN, CSF: Protein, CSF: 37 mg/dL (ref 15–45)

## 2023-10-01 LAB — VDRL, CSF: VDRL CSF: NONREACTIVE

## 2023-10-02 LAB — LYME ANTIBODIES CSF
Lyme IgG CSF Interp WB: NEGATIVE
Lyme IgM CSF Interp WB: NEGATIVE
P18 IgG CSF: ABSENT
P23 IgG CSF: ABSENT
P23 IgM CSF: ABSENT
P28 IgG CSF: ABSENT
P30 IgG CSF: ABSENT
P39 IgG CSF: ABSENT
P39 IgM CSF: ABSENT
P41 IgG CSF: ABSENT
P41 IgM CSF: ABSENT
P45 IgG CSF: ABSENT
P58 IgG CSF: ABSENT
P66 IgG CSF: ABSENT
P93 IgG CSF: ABSENT

## 2023-10-02 LAB — ANTI-MOG, CSF: Anti-MOG, CSF, Cell-based IFA: NEGATIVE

## 2023-10-02 LAB — IGG SYNTHESIS RATE/INDEX, CSF
Albumin CSF: 16 mg/dL (ref 8–37)
Albumin: 4.5 g/dL (ref 3.9–4.9)
CSF IgG Index: 1.3 — ABNORMAL HIGH (ref 0.0–0.7)
CSF/Ser Alb Index: 4 (ref 0–8)
IgG CSF: 4.3 mg/dL (ref 0.0–6.7)
IgG Syn Rate CSF: 10.3 mg/d — ABNORMAL HIGH
IgG/Alb Ratio CSF: 0.27 — ABNORMAL HIGH (ref 0.00–0.25)
IgG: 948 mg/dL (ref 586–1602)

## 2023-10-02 LAB — MYELIN BASIC PROTEIN, CSF: MBP CSF: 2.2 ng/mL (ref 0.0–4.7)

## 2023-10-02 LAB — ANGIOTENSIN CONVERTING ENZYME, CSF: ACE CSF: <1.5 U/L (ref 0.0–3.1)

## 2023-10-03 LAB — MISCELLANEOUS TEST, LABCORP

## 2023-10-04 LAB — MISCELLANEOUS TEST, LABCORP

## 2023-10-05 LAB — OLIGOCLONAL BANDS CSF/SERUM

## 2023-10-16 ENCOUNTER — Encounter: Payer: MEDICARE | Attending: Student in an Organized Health Care Education/Training Program

## 2023-10-26 ENCOUNTER — Ambulatory Visit
Admit: 2023-10-26 | Discharge: 2023-10-26 | Payer: MEDICARE | Attending: Student in an Organized Health Care Education/Training Program

## 2023-10-26 DIAGNOSIS — G35 Multiple sclerosis: Principal | ICD-10-CM

## 2023-10-26 MED ORDER — baclofen (Lioresal) 10 mg tablet
10 | ORAL_TABLET | Freq: Two times a day (BID) | ORAL | 0 refills | 30.00000 days | Status: DC
Start: 2023-10-26 — End: 2024-01-02

## 2023-10-26 NOTE — Progress Notes (Signed)
 [facility]  General Neurology Clinic    Reason for the visit: Sandra Villegas is 63 y.o. right handed female  who is here for follow up of abnormal MRI brain.    Clinical summary:  Diagnosis: Relapsing Remitting Multiple Sclerosis (RRMS)  Diagnosis code: 1 - RRMS  Disease activity in prior year: Yes    Date of symptom onset: August 2024  Date of diagnosis: Pending workup    Clinical flare history:  Symptom Date (MM/YYYY) Notes   Right facial weakness January 2025 Brief and lasted for few hours, followed by weird feeling on the right side of the face     Treatment history:  Medication Start date (MM/YYYY) Stop date (MM/YYYY) Notes   Ublituximab (Briumvi) 11/09/2023 - 11/27/2023       History of present illness:  Initial history:  Patient has a history of bipolar 1 disorder, PTSD, anxiety and depression, she is followed by psychiatry and was treated with atypical antipsychotics for many years.  She was previously treated with aripiprazole and lurasidone.  She also has a history of psoriatic arthritis and was followed by rheumatology with negative autoimmune labs.    Around January 2024, patient started having plant based diet and then lost 60 lbs.    During August 2024, she started having pain in left wrist and left fingers, described as sharp and achy, intermittent and last for hours, tends to happens only when stressed, no burning sensation, numbness or tingling. She has urinary and fecal incontinence with no warning so colonoscopy was done and reportedly normal.     Around same time in August 2024, she started having memory issues with forgetfulness of names of her grandchildren, not being able to recognize her church when driving by it. She also noticed poor energy and not being able to hand every day activities.     Around January 2025, she was hospitalized with COVID, she was having palpitations during hospitalization, she then developed brief right facial weakness  for few hours and she was concerned abut stroke.  No head imaging was done during hospitalization.  Her medications for depression were adjusted and duloxetine was switched to citalopram & started on venlafaxine and lurasidone. Afterwards, she continued to have weird feeling in right side of face with muscle twisting and biting lips but no weakness, numbness or tingling.  MRI of the brain was obtained in April 2025 which showed demyelinating lesions.    Patient does not recall having any acute episodes of vision loss, dysphagia or dysarthria, focal weakness in the past.    MRI cervical and thoracic spine in May 2025 showed 2 lesions at C2-C3 and T3-T4 that are classical for multiple sclerosis with no active enhancement.    Interval history:  History was provided by the patient, who is a fair historian. She was unaccompanied to the visit.   - Paraspinal muscles were noted to be stiff while performing lumbar puncture, likely reflecting spasticity of paraspinal muscles.  Patient will be started on baclofen  with a plan to do Botox afterwards.  - Patient tolerated lumbar puncture well with no headache or other symptoms.  - Patient is scheduled for Briumvi infusion this month.    Comprehensive review of other neurological symptoms:  Symptom Yes No Description   Cognitive impairment [x]  []  Patient forget recent and upcoming events and appointment, unable to remember without reminders and calendar.  She forget names of her grandchildren but not immediate family members.   She  drives a car and has got lost while driving before, she always lose her stuff around the house and find them eventually.   She can cook and follow a recipe, she can use laptop and cellphone with no issues, she is going through financial hardship and filling for bankruptcy.    Sleep impairment  [x]  []  She has difficulty falling asleep and staying asleep.   If sleep is interrupted, with difficulty going back to sleep.  Sleeping aids: zolpidem &  hydroxyzine    Depression/Anxiety  [x]  []  Anxiety, depression, Bipolar I, PTSD  Patient is followed by psychiatry Dr. Ladd, she was started on lurasidone in April 2025.   Headache []  [x]     Autonomic symptoms []  [x]     Gait issues and falls [x]  []  She feel unsteady when walking on uneven surfaces, and sometimes has difficulty standing up when she fall on the floor.    Visual symptoms, including hallucinations [x]  []  Blurred vision in both eyes   Brainstem symptoms [x]  []  No diplopia. She has globus sensation with choking but no dysphagia or dysarthria  No facial weakness or numbness or tingling  No tinnitus, hearing impairment or vertigo   Motor symptoms (weakness, spasticity, cramps) [x]  []  She feel akathisia and restless when sitting and sleeping. She feel discomfort in both legs with premonitory symptoms and anxiety if not moving around.    Bowel/bladder issues []  [x]  No urinary or fecal urgency or incontinence    Sensory symptoms (Numbness/paraesthesia) [x]  []  Intermittent numbness and tinging in bilateral fingers    Tremor []  [x]        Medical Review of Systems:  As per above in HPI. A 10-point ROS is otherwise negative.     Past history:  Medical History[1]   Surgical History[2]    Social history:  Social History     Socioeconomic History    Marital status: Single     Spouse name: Not on file    Number of children: Not on file    Years of education: Not on file    Highest education level: Not on file   Occupational History    Not on file   Tobacco Use    Smoking status: Never    Smokeless tobacco: Never   Substance and Sexual Activity    Alcohol use: Yes     Alcohol/week: 1.0 standard drink of alcohol     Types: 1 Glasses of wine per week    Drug use: Never    Sexual activity: Not Currently     Partners: Male     Birth control/protection: Abstinence   Other Topics Concern    Not on file   Social History Narrative    Not on file     Social Determinants of Health     Financial Resource Strain: Low Risk   (11/18/2018)    Received from [facility]    Overall Financial Resource Strain (CARDIA)     Difficulty of Paying Living Expenses: Not hard at all   Food Insecurity: No Food Insecurity (11/18/2018)    Received from [facility]    Hunger Vital Sign     Worried About Running Out of Food in the Last Year: Never true     Ran Out of Food in the Last Year: Never true   Transportation Needs: No Transportation Needs (11/18/2018)    Received from [facility] - Transportation     Lack of Transportation (Medical): No  Lack of Transportation (Non-Medical): No   Physical Activity: Inactive (11/18/2018)    Received from [facility]    Exercise Vital Sign     Days of Exercise per Week: 0 days     Minutes of Exercise per Session: 0 min   Stress: Stress Concern Present (11/18/2018)    Received from [facility] - Occupational Stress Questionnaire     Feeling of Stress : To some extent   Social Connections: Moderately Integrated (11/18/2018)    Received from [facility]    Social Connection and Isolation Panel [NHANES]     Frequency of Communication with Friends and Family: More than three times a week     Frequency of Social Gatherings with Friends and Family: More than three times a week     Attends Religious Services: More than 4 times per year     Active Member of [employer] or Organizations: Yes     Attends Engineer, structural: More than 4 times per year     Marital Status: Divorced   Intimate Partner Violence: Not At Risk (11/18/2018)    Received from [facility]    Humiliation, Afraid, Rape, and Kick questionnaire     Fear of Current or Ex-Partner: No     Emotionally Abused: No     Physically Abused: No     Sexually Abused: No   Housing Stability: Not on file      Occupation: On disability for Bipolar  Highest level of education: Associates degree    Family history:   Family history of neurological diseases: Yes - Comment: sister with MS    Allergies:  Lithium, Shellfish  containing products, Doxycycline, Risperidone, Strawberry, and Latex    Current Medications[3]    Physical examination:  Vital signs: BP 119/64   Pulse 69   Ht 1.626 m   Wt 95.3 kg   BMI 36.05 kg/m    General: No acute distress, well-groomed, appears stated age  Head: Normocephalic, atraumatic.  Neck: Supple  Respiratory: Non labored breathing    Neurological exam:   Mental Status: Awake, oriented to person, place, time. Normal fund of knowledge. Fluent speech and comprehension. Able to name current and last [place]  president. Able to follow complex digiti specific commands that cross midline. Able to perform complex calculations to $0.4.     Cranial Nerves: Fundoscopy showed no papilledema or optic disc atrophy. VA (corrected): 20/20 OU. Visual fields full to confrontation. PERRL without evidence of APD. EOMI without signs of intranuclear ophthalmoplegia or nystagmus. V1-V3 intact to light touch bilaterally. Face and nasolabial folds symmetric. Hearing intact to finger rub, palate and tongue midline. Shoulder shrug is full strength bilaterally.  Rare nonpurposeful oral buccal facial movements that can be suppressed.    Motor Exam: Bulk is normal. Tone is normal in bilateral upper & lower extremities. There is no evidence of tremor. Akathisia in all limbs. Pronator drift negative.  Strength (R/L), MRC grading 0-5:   Upper extremities: Shoulder abduction 5/5, elbow flexion 5/5, elbow extension 5/5, wrist flexion 5/5, wrist extension 5/5, finger extension 5/5, interossei 5/5  Lower extremities: Hip flexion 5/5, hip abduction 5/5, hip adduction 5/5, knee flexion 5/5, knee extension 5/5, ankle dorsiflexion 5/5, ankle plantar flexion 5/5.    Sensation: Sensation to light touch and pinprick is intact in all extremities. Vibration is mildly impaired in both feet. Proprioception intact in both both feet. Romberg is negative    Coordination: Finger-to-nose shows  no signs of dysmetria. Finger-tapping is fast, fluid and  synchronized.     Deep tendon reflexes: (R/L): Brachioradialis 3+/3+, Biceps 3+/3+, Triceps 3+/3+, Patellar 3+/3+ with crossed adduction, negative Hoffman sign bilaterally.     Gait: Normal based gait, normal step and stride length, no spasticity, normal cadence, and arm swing. Able to tandem walk.    EDSS: 2.5 - Minimal disability in two FS (two FS grade 2,others 0 or 1).  Pyramidal Functions: 2 - Minimal disability: patient complains of motor-fatigability or reduced performance in strenuous motor tasks (BMRC grade 4 in one or two muscle groups)  Cerebellar Functions: 0 - Normal  Sensory Functions: 0 - Normal  Brainstem Functions: 0 - Normal  Bowel & Bladder Functions: 0 - Normal  Visual Functions: 0 - Normal  Cerebral (Cognitive) Functions: 2 - Mild decrease in mentation; moderate or severe fatigue     Test 08/20/2023 Date (MM/YYYY) Date (MM/YYYY) Date (MM/YYYY)   EDSS 2.5      SDMT 41      9-hole peg test Rt 22s, Lt 23s      25 feet walk test 4.5s          Relevant laboratory and imaging data (my direct review):  CSF studies (09/28/2023):  CSF protein: 37  CSF Glucose: 67  CSF WBCs (tube 4): 1  Oligoclonal bands: 5  IgG index: 1.3  Myelin basic protein: 2.2  Others: Normal CSF ACE, negative CSF VDRL, negative CSF Lyme antibodies.  Negative CSF cytology.  Insufficient sample for flow cytometry.  Negative autoimmune/paraneoplastic panel, negative MOG antibody.    Serum studies:  Test Positive Negative Not done Date (MM/YYYY) Notes   NMO antibody []  [x]  []  May 2025    MOG antibody []  [x]  []  May 2025    Autoimmune/paraneoplastic []  [x]  []  May 2025    Serum IL2R []  [x]  []  May 2025 Low level (222)   Sjogren antibodies []  [x]  []  May 2025    ANCA []  [x]  []  May 2025    ANA []  [x]  []  May 2025      Test Positive Negative Indeterminate Date (MM/YYYY) Notes   JCV antibody []  []  []      TB Quantiferon []  [x]  []  June 2025    Hepatitis panel []  [x]  []  June 2025 Negative HBs antigen, HBc antibody, HCV antibody   HSV antibodies []  []   []      VZV antibodies [x]  []  []  June 2025 Positive IgG and negative IgM   EBV panel [x]  []  []  May 2025 Prior infection   Syphilis screen []  [x]  []  May 2025    Lyme screen []  [x]  []  May 2025      Test Level Date (MM/YYYY) Notes   Vitamin D level 09 Sep 2023    Vitamin B12 level 554 May 2025 Normal MMA and homocystine     Plasma NFL 1.20 September 2023      GFAP 36.24 September 2023      IgG 908 May 2025 Baseline       Imaging:  MRI brain WO 08/08/2023:  Several periventricular round/ovoid FLAIR hyperintensity in perivenular distribution that are consistent with CNS demyelinating disease, there are also scattered deep white matter FLAIR hyperintensity but no juxtacortical or infratentorial lesions.    MRI cervical spine & thoracic spine W/WO 09/14/2023:  Small nonenhancing cord lesions at the C2-C3 and T3-T4 level. There is subtle heterogeneity of the remainder of the cord, though it is unclear whether these reflect tiny lesions or motion artifact.  Other studies:  None    Assessment:  Sandra Villegas is 63 y.o.  right handed female who is being followed for CNS demyelinating disease.    Diagnosis discussion:  - Review of MRI brain showed several periventricular FLAIR hyperintensity that are round/ovoid with perivenular distribution that are characteristics of CNS demyelinating disease.  There are also deep white matter FLAIR hyperintensity that are round-ovoid with no corpus callosum lesions or infratentorial lesions.   - Review of MRI cervical and thoracic spine showed peripheral eccentric demyelinating lesion that are typical for multiple sclerosis.  - Patient meets 2017 McDonald criteria for relapsing remitting multiple sclerosis.  She had typical clinical presentation (right facial weakness), symptoms were brief and no objective evidence of demyelination. Patient meets dissemination in space (periventricular, spinal cord lesions), and dissemination in time (5 oligoclonal bands). Although patient has behavioral health  issues, no evidence of disability progression over the years.  - Prior workup for mimics showed negative Sjogren antibodies, NMO and MOG antibodies.  Onset of multiple sclerosis after the age of 89 is a red flag about the diagnosis.    Management discussion:  - Patient will benefit from starting high efficacy disease modifying therapy given spinal cord lesions, B-cell depletion therapy will be preferred treatment options since they can help prevent further relapses as well as decrease risk of progression.  - Patient will be monitored for NEDA 3 criteria 6 months after starting DMT and then every 12 months.    Other issues:  - Low back pain and spasticity: Patient was noted to have spasticity of lumbar paraspinal muscles which is likely related to multiple sclerosis, she will be treated with baclofen  and she will benefit from Botox in the future.  - Patient has multiple psychiatric comorbidities including anxiety, depression, PTSD, bipolar 1 disorder, she was treated with atypical antipsychotics for many years, currently treated with lamotrigine and SNRI.    Plan:  - DMT: Proceed with Briumvi 150-450 mg followed by 450 mg every 6 months.   - Imaging: Repeat MRI brain ICOMETRIX WO, MRI cervical and thoracic spine WO 6 months after starting DMT (January 2026).  - Labs:  - Multiple sclerosis monitoring profile (plasma level, GFAP) 3 months after starting DMT and then every 6 months.   - Immunoglobulin panel, CBC, CMP every 6 months  - Medications:   - Start baclofen  10 mg twice daily for spasticity of lower back, consider Botox as next step  - Continue lamotrigine 50 mg BID & venlafaxine 150 mg QD (prescribed by psychiatry)  - Continue gabapentin 100 mg TID (prescribed for pain)  - Continue physical therapy to help with balance training and strengthening exercises including aquatic therapy  - Follow-up with neurology in 3 months    Supportive treatment:  - Vitamin D supplementation 2000 units daily and vitamin B12  supplementation 1000 mcg daily.   - Patient can use CoQ10 400 mg daily and alpha lipoic acid 600 mg twice daily, they have some benefit in metabolic supplementation to decrease risk of progression   - Patient should be aware about Uhthoff phenomenon during summer months.  - Patients with multiple sclerosis can use medical marijuana for pain and anxiety, it is recommended to use products that are third party tested.  - It is recommended for patients with multiple sclerosis to have regular exercise and eating well balanced mediterranean diet     Thank you for allowing me to participate in Reena FORBES Gander care. Please reach out to me or  my office if any questions or concerns.    Sincerely,  Toya Daria Spears, MD  Neurologist, Neuroimmunologist, Neuroinfectious Diseases Specialist  [facility] [place] [facility]  [facility], [facility]   Work Phone: [phone]  Work Fax: [phone]    Dictation Software was used to dictate this note and, despite all efforts to proofread, some dictation related unintentional errors or typos may occur. If you have any questions as the referring provider colleague, please do not hesitate to contact our office. If you are the patient reading this chart and reviewing the medical notes, please note that medical documentation is often written with abbreviations in medical terminology. ?Physician documentation is typically written by physicians for other healthcare providers' colleagues to review in order to efficiently and briefly summarize the clinical encounters, tests that were ordered and interpreted; and the diagnosis in the most efficient and effective way possible.     A total of 32 min were spent to for medical decision making and counseling. Medical Decision Making involved reviewing relevant Medical records (as available), relevant Lab results (if accessible), relevant Radiology results & images (if accessible), relevant Procedures  and diagnostic tests (as applicable), independent visualization of relevant neurologic testing(as needed), all questions answered to my best up-to-date Knowledge and understanding, test discussed with the performing provider (if applicable), relevant Medical records ordered (if needed). Counseling involved counseled regarding diagnostic results, instructions for management, risk factor reductions, prognosis, patient education, impressions, importance of compliance with treatment and risks and benefits of treatment options, as indicated. Counseled regarding Tobacco/EtoH cessation and proper use and storage of opioid medications (if indicated and relevant), counseled regarding importance of compliance with treatment and risks and benefits of treatment options. Additional time was spent in additional diagnostic and management research, and relevant up-to-date info, and medical record review, as needed. Prescription Drug Monitoring Program Report: MD or Assigned Delegate Reviewed patient's report as needed prior to prescribing Schedule II, III and IV medications that required review by law. Additional time was spent in care coordination including consultation with peers, staff, schedulers, nursing, and phone collaboration; as needed         [1]   Past Medical History:  Diagnosis Date    Anxiety     Brain concussion     Dementia (Multi-HCC)     Depression      Fibrocystic breast     Fibromyalgia, primary     Memory loss     MS (multiple sclerosis) (Multi-HCC)     Stroke (Multi-HCC)    [2]   Past Surgical History:  Procedure Laterality Date    BREAST BIOPSY      KYPHOSIS SURGERY  1987   [3]   Current Outpatient Medications   Medication Sig Dispense Refill    acetaminophen 500 mg capsule Take 1 tablet by mouth if needed.      cholecalciferol , vitamin D3, (Vitamin D3) 50 mcg (2,000 unit) capsule Take 1 capsule (50 mcg) by mouth once daily. 90 capsule 0    clindamycin (Cleocin T) 1 % lotion Apply topically twice daily.       diclofenac (Voltaren) 75 mg EC tablet Take 75 mg by mouth if needed in the morning and at bedtime.      fluticasone propionate (FLONASE NASL) Administer 2 sprays into affected nostril(s) in the morning.      gabapentin (Neurontin) 100 mg capsule Take 100 mg by mouth three times daily.      hydrOXYzine HCL (Atarax)  25 mg tablet Take 25 mg by mouth if needed for itching.      hydrOXYzine pamoate (Vistaril) 25 mg capsule Take 25 mg by mouth.      ketoconazole (NIZOral) 2 % cream Apply topically once daily.      lamoTRIgine (LaMICtal) 100 mg tablet Take 0.5 tablets by mouth twice daily.      multivit-min-FA-lycopen-lutein (Centrum Silver) 0.4 mg-300 mcg- 250 mcg tablet Take 1 tablet by mouth once daily.      thiamine  (Vitamin B-1) 50 mg tablet Take 1 tablet (50 mg) by mouth once daily. 90 tablet 3    venlafaxine XR (Effexor-XR) 150 mg 24 hr capsule Take 1 capsule by mouth in the morning.      zolpidem CR (Ambien CR) 12.5 mg ER tablet Take 5 mg by mouth in the morning.      baclofen  (Lioresal ) 10 mg tablet Take 1 tablet (10 mg) by mouth twice daily. 60 tablet 0     No current facility-administered medications for this visit.

## 2023-10-29 ENCOUNTER — Encounter: Payer: MEDICARE | Attending: Student in an Organized Health Care Education/Training Program

## 2023-11-09 ENCOUNTER — Ambulatory Visit: Admit: 2023-11-09 | Discharge: 2023-11-09 | Payer: MEDICARE

## 2023-11-09 ENCOUNTER — Encounter: Payer: MEDICARE | Attending: Student in an Organized Health Care Education/Training Program

## 2023-11-09 DIAGNOSIS — G35 Multiple sclerosis: Principal | ICD-10-CM

## 2023-11-09 MED ORDER — acetaminophen (Tylenol) 325 mg tablet  - Omnicell Override Pull
325 | ORAL | Status: AC
Start: 2023-11-09 — End: 2023-11-09

## 2023-11-09 MED ORDER — acetaminophen (Tylenol) tablet 650 mg
325 | Freq: Once | ORAL | Status: DC | PRN
Start: 2023-11-09 — End: 2023-11-09
  Administered 2023-11-09: 17:00:00 650 mg via ORAL

## 2023-11-09 MED ORDER — ondansetron (Zofran) injection 4 mg
4 | Freq: Once | INTRAMUSCULAR | Status: AC
Start: 2023-11-09 — End: 2023-11-09
  Administered 2023-11-09: 12:00:00 4 mg via INTRAVENOUS

## 2023-11-09 MED ORDER — diphenhydrAMINE (BENADryl) capsule 50 mg
25 | Freq: Once | ORAL | Status: AC
Start: 2023-11-09 — End: 2023-11-09
  Administered 2023-11-09: 12:00:00 50 mg via ORAL

## 2023-11-09 MED ORDER — methylPREDNISolone sodium succ (SOLU-Medrol) 125 mg injection  - Omnicell Override Pull
125 | INTRAMUSCULAR | Status: AC
Start: 2023-11-09 — End: 2023-11-09

## 2023-11-09 MED ORDER — sodium chloride 0.9 % flush 10 mL
Freq: Once | INTRAMUSCULAR | Status: AC
Start: 2023-11-09 — End: 2023-11-09
  Administered 2023-11-09: 13:00:00 10 mL via INTRAVENOUS

## 2023-11-09 MED ORDER — methylPREDNISolone sodium succ (SOLU-Medrol) injection 100 mg
125 | Freq: Once | INTRAMUSCULAR | Status: AC
Start: 2023-11-09 — End: 2023-11-09
  Administered 2023-11-09: 12:00:00 100 mg via INTRAVENOUS

## 2023-11-09 MED ORDER — acetaminophen (Tylenol) tablet 650 mg
325 | Freq: Once | ORAL | Status: AC
Start: 2023-11-09 — End: 2023-11-09
  Administered 2023-11-09: 12:00:00 650 mg via ORAL

## 2023-11-09 MED ORDER — sodium chloride 0.9 % flush 10 mL
Freq: Once | INTRAMUSCULAR | Status: DC | PRN
Start: 2023-11-09 — End: 2023-11-09

## 2023-11-09 MED ORDER — diphenhydrAMINE (BENADryl) 25 mg capsule  - Omnicell Override Pull
25 | ORAL | Status: AC
Start: 2023-11-09 — End: 2023-11-09

## 2023-11-09 MED ORDER — ondansetron (Zofran) 4 mg/2 mL injection  - Omnicell Override Pull
4 | INTRAMUSCULAR | Status: AC
Start: 2023-11-09 — End: 2023-11-09

## 2023-11-09 MED ORDER — ublituximab-xiiy (Briumvi) 150 mg in sodium chloride 0.9 % 250 mL IVPB
0.9 | Freq: Once | INTRAVENOUS | Status: AC
Start: 2023-11-09 — End: 2023-11-09
  Administered 2023-11-09: 13:00:00 150 mg via INTRAVENOUS

## 2023-11-09 MED FILL — ACETAMINOPHEN 325 MG TABLET: 325 325 mg | ORAL | Qty: 2 | Fill #0

## 2023-11-09 MED FILL — UBLITUXIMAB-XIIY IVPB IN 250 ML NS: 25 25 mg/mL | INTRAVENOUS | Qty: 6 | Fill #0

## 2023-11-09 MED FILL — METHYLPREDNISOLONE SODIUM SUCCINATE 125 MG SOLUTION FOR INJECTION: 125 mg | INTRAMUSCULAR | Qty: 2 | Fill #0

## 2023-11-09 MED FILL — ONDANSETRON HCL (PF) 4 MG/2 ML INJECTION SOLUTION: 4 4 mg/2 mL | INTRAMUSCULAR | Qty: 2 | Fill #0

## 2023-11-09 MED FILL — DIPHENHYDRAMINE 25 MG CAPSULE: 25 25 mg | ORAL | Qty: 2 | Fill #0

## 2023-11-09 NOTE — Progress Notes (Signed)
 Pt arrives to [facility] ambulatory for Briumvi  150mg  to run per protocol, loading dose 1.     Labs reviewed 09/21/23: TB and HBSag negative.     IV established, brisk blood return noted, flushed easily and without resistance.     Pre-medications (Tylenol  and Benadryl  PO, Solu-Medrol  and Zofran  IVP) administered as ordered.     Medication administered as ordered and without complication. Pt observed 1 hour post infusion, no evidence of adverse reaction noted.     IV removed, dressed with dry sterile bandage.     Pt aware of next appointment, able to ambulate independently from [facility]

## 2023-11-27 ENCOUNTER — Ambulatory Visit: Admit: 2023-11-27 | Discharge: 2023-11-27 | Payer: MEDICARE

## 2023-11-27 DIAGNOSIS — G35 Multiple sclerosis: Principal | ICD-10-CM

## 2023-11-27 MED ORDER — methylPREDNISolone sodium succ (SOLU-Medrol) injection 100 mg
125 | Freq: Once | INTRAMUSCULAR | Status: AC
Start: 2023-11-27 — End: 2023-11-27
  Administered 2023-11-27: 14:00:00 100 mg via INTRAVENOUS

## 2023-11-27 MED ORDER — acetaminophen (Tylenol) 325 mg tablet  - Omnicell Override Pull
325 | ORAL | Status: AC
Start: 2023-11-27 — End: 2023-11-27

## 2023-11-27 MED ORDER — diphenhydrAMINE (BENADryl) 25 mg capsule  - Omnicell Override Pull
25 | ORAL | Status: AC
Start: 2023-11-27 — End: 2023-11-27

## 2023-11-27 MED ORDER — ondansetron (Zofran) 4 mg/2 mL injection  - Omnicell Override Pull
4 | INTRAMUSCULAR | Status: AC
Start: 2023-11-27 — End: 2023-11-27

## 2023-11-27 MED ORDER — sodium chloride 0.9 % flush 10 mL
Freq: Once | INTRAMUSCULAR | Status: DC | PRN
Start: 2023-11-27 — End: 2023-11-27
  Administered 2023-11-27: 14:00:00 10 mL via INTRAVENOUS

## 2023-11-27 MED ORDER — diphenhydrAMINE (BENADryl) capsule 50 mg
25 | Freq: Once | ORAL | Status: AC
Start: 2023-11-27 — End: 2023-11-27
  Administered 2023-11-27: 14:00:00 50 mg via ORAL

## 2023-11-27 MED ORDER — ondansetron (Zofran) injection 4 mg
4 | Freq: Once | INTRAMUSCULAR | Status: AC
Start: 2023-11-27 — End: 2023-11-27
  Administered 2023-11-27: 14:00:00 4 mg via INTRAVENOUS

## 2023-11-27 MED ORDER — acetaminophen (Tylenol) tablet 650 mg
325 | Freq: Once | ORAL | Status: AC
Start: 2023-11-27 — End: 2023-11-27
  Administered 2023-11-27: 14:00:00 650 mg via ORAL

## 2023-11-27 MED ORDER — ublituximab-xiiy (Briumvi) 450 mg in sodium chloride 0.9 % 250 mL IVPB
25 | Freq: Once | INTRAVENOUS | Status: AC
Start: 2023-11-27 — End: 2023-11-27
  Administered 2023-11-27: 15:00:00 450 mg via INTRAVENOUS

## 2023-11-27 MED ORDER — methylPREDNISolone sodium succ (SOLU-Medrol) 125 mg injection  - Omnicell Override Pull
125 | INTRAMUSCULAR | Status: AC
Start: 2023-11-27 — End: 2023-11-27

## 2023-11-27 MED ORDER — sodium chloride 0.9 % flush 10 mL
Freq: Once | INTRAMUSCULAR | Status: DC
Start: 2023-11-27 — End: 2023-11-27

## 2023-11-27 MED FILL — ACETAMINOPHEN 325 MG TABLET: 325 325 mg | ORAL | Qty: 2 | Fill #0

## 2023-11-27 MED FILL — METHYLPREDNISOLONE SODIUM SUCCINATE 125 MG SOLUTION FOR INJECTION: 125 mg | INTRAMUSCULAR | Qty: 2 | Fill #0

## 2023-11-27 MED FILL — ONDANSETRON HCL (PF) 4 MG/2 ML INJECTION SOLUTION: 4 4 mg/2 mL | INTRAMUSCULAR | Qty: 2 | Fill #0

## 2023-11-27 MED FILL — UBLITUXIMAB-XIIY IVPB IN 250 ML NS: 25 25 mg/mL | INTRAVENOUS | Qty: 18 | Fill #0

## 2023-11-27 MED FILL — DIPHENHYDRAMINE 25 MG CAPSULE: 25 25 mg | ORAL | Qty: 2 | Fill #0

## 2023-11-27 NOTE — Progress Notes (Signed)
 Sandra Villegas comes to [facility] ambulatory for Briumvi  450mg  to run per protocol, loading dose 1.      Labs reviewed 09/21/23: TB and HBSag negative.      IV established, brisk blood return noted, flushed easily and without resistance.      Pre-medications (Tylenol  and Benadryl  PO, Solu-Medrol  and Zofran  IVP) administered as ordered.      Medication administered as ordered and without complication. Pt observed 1 hour post infusion, no evidence of adverse reaction noted.      IV removed, dressed with dry sterile bandage.      Pt aware of next appointment, able to ambulate independently from [facility].

## 2023-12-05 ENCOUNTER — Encounter: Payer: MEDICARE | Attending: Neurology

## 2024-01-02 ENCOUNTER — Ambulatory Visit
Admit: 2024-01-02 | Discharge: 2024-01-02 | Payer: MEDICARE | Attending: Student in an Organized Health Care Education/Training Program

## 2024-01-02 MED ORDER — gabapentin (Neurontin) 100 mg capsule
100 | ORAL_CAPSULE | ORAL | 0 refills | 30.00000 days | Status: AC
Start: 2024-01-02 — End: 2024-03-27

## 2024-01-02 MED ORDER — baclofen (Lioresal) 10 mg tablet
10 | ORAL_TABLET | Freq: Two times a day (BID) | ORAL | 0 refills | 30.00000 days | Status: AC
Start: 2024-01-02 — End: 2024-04-01

## 2024-01-02 NOTE — Progress Notes (Signed)
 [facility]  General Neurology Clinic    Reason for the visit: Sandra Villegas is 63 y.o. right handed female  who is here for follow up of abnormal MRI brain.    Clinical summary:  Diagnosis: Relapsing Remitting Multiple Sclerosis (RRMS)  Diagnosis code: 1 - RRMS  Disease activity in prior year: Yes    Date of symptom onset: August 2024  Date of diagnosis: June 2025    Clinical flare history:  Symptom Date (MM/YYYY) Notes   Right facial weakness January 2025 Brief and lasted for few hours, followed by weird feeling on the right side of the face     Treatment history:  Medication Start date (MM/YYYY) Stop date (MM/YYYY) Notes   Ublituximab  (Briumvi ) 11/09/2023 - 11/27/2023       History of present illness:  Initial history:  Patient has a history of bipolar 1 disorder, PTSD, anxiety and depression, she is followed by psychiatry and was treated with atypical antipsychotics for many years.  She was previously treated with aripiprazole and lurasidone.  She also has a history of psoriatic arthritis and was followed by rheumatology with negative autoimmune labs.    Around January 2024, patient started having plant based diet and then lost 60 lbs.    During August 2024, she started having pain in left wrist and left fingers, described as sharp and achy, intermittent and last for hours, tends to happens only when stressed, no burning sensation, numbness or tingling. She has urinary and fecal incontinence with no warning so colonoscopy was done and reportedly normal.     Around same time in August 2024, she started having memory issues with forgetfulness of names of her grandchildren, not being able to recognize her church when driving by it. She also noticed poor energy and not being able to hand every day activities.     Around January 2025, she was hospitalized with COVID, she was having palpitations during hospitalization, she then developed brief right facial weakness for few  hours and she was concerned abut stroke.  No head imaging was done during hospitalization.  Her medications for depression were adjusted and duloxetine was switched to citalopram & started on venlafaxine and lurasidone. Afterwards, she continued to have weird feeling in right side of face with muscle twisting and biting lips but no weakness, numbness or tingling.  MRI of the brain was obtained in April 2025 which showed demyelinating lesions.    Patient does not recall having any acute episodes of vision loss, dysphagia or dysarthria, focal weakness in the past.    MRI cervical and thoracic spine in May 2025 showed 2 lesions at C2-C3 and T3-T4 that are classical for multiple sclerosis with no active enhancement.    Interval history:  History was provided by the patient, who is a fair historian. She was unaccompanied to the visit.   Vaccinations: s/p 5 COVID vaccines & 2 shingles vaccines. Not up date on Flu vaccine.  Infections: COVID URTI in January 2025. No dental infection or UTI over past 12 months.  Recent hospitalization: ER visit in July 2025 for left knee injury, ER visit in August 2025 for right knee injury. She was told she has severe OA and surgery was recommended.   DMT Briumvi , no side effects or infusion reaction.   - Paraspinal muscles were noted to be stiff while performing lumbar puncture, likely reflecting spasticity of paraspinal muscles. She feel spasticity is better on baclofen , worse with activity and standing  for a long time.     Comprehensive review of other neurological symptoms:  Symptom Yes No Description   Cognitive impairment [x]  []  Patient forget recent and upcoming events and appointment, unable to remember without reminders and calendar.  She forget names of her grandchildren but not immediate family members.   She drives a car and has got lost while driving before, she always lose her stuff around the house and find them eventually.   She can cook and follow a recipe, she can use  laptop and cellphone with no issues, she is going through financial hardship and filling for bankruptcy.    Sleep impairment  [x]  []  She has difficulty falling asleep and staying asleep.   If sleep is interrupted, with difficulty going back to sleep.  Sleeping aids: zolpidem & hydroxyzine    Depression/Anxiety  [x]  []  Anxiety, depression, Bipolar I, PTSD  Patient is followed by psychiatry Dr. Ladd, she was started on lurasidone in April 2025.   Headache []  [x]     Autonomic symptoms []  [x]     Gait issues and falls [x]  []  She feel unsteady when walking on uneven surfaces, and sometimes has difficulty standing up when she fall on the floor.    Visual symptoms, including hallucinations [x]  []  Blurred vision in both eyes   Brainstem symptoms [x]  []  No diplopia. She has globus sensation with choking but no dysphagia or dysarthria  No facial weakness or numbness or tingling  No tinnitus, hearing impairment or vertigo   Motor symptoms (weakness, spasticity, cramps) [x]  []  She feel akathisia and restless when sitting and sleeping. She feel discomfort in both legs with premonitory symptoms and anxiety if not moving around.    Bowel/bladder issues []  [x]  No urinary or fecal urgency or incontinence    Sensory symptoms (Numbness/paraesthesia) [x]  []  Intermittent numbness and tinging in bilateral fingers    Tremor []  [x]        Medical Review of Systems:  As per above in HPI. A 10-point ROS is otherwise negative.     Past history:  Medical History[1]   Surgical History[2]    Social history:  Social History     Socioeconomic History    Marital status: Single     Spouse name: Not on file    Number of children: Not on file    Years of education: Not on file    Highest education level: Not on file   Occupational History    Not on file   Tobacco Use    Smoking status: Never    Smokeless tobacco: Never   Substance and Sexual Activity    Alcohol use: Yes     Alcohol/week: 1.0 standard drink of alcohol     Types: 1 Glasses of wine  per week    Drug use: Never    Sexual activity: Not Currently     Partners: Male     Birth control/protection: Abstinence   Other Topics Concern    Not on file   Social History Narrative    Not on file     Social Determinants of Health     Financial Resource Strain: Low Risk  (11/18/2018)    Received from [facility]    Overall Financial Resource Strain (CARDIA)     Difficulty of Paying Living Expenses: Not hard at all   Food Insecurity: No Food Insecurity (11/18/2018)    Received from [facility]    Hunger Vital Sign     Worried About Running Out  of Food in the Last Year: Never true     Ran Out of Food in the Last Year: Never true   Transportation Needs: No Transportation Needs (11/18/2018)    Received from [facility] - [facility]     Lack of Transportation (Medical): No     Lack of Transportation (Non-Medical): No   Physical Activity: Inactive (11/18/2018)    Received from [facility]    Exercise Vital Sign     Days of Exercise per Week: 0 days     Minutes of Exercise per Session: 0 min   Stress: Stress Concern Present (11/18/2018)    Received from [facility] - Occupational Stress Questionnaire     Feeling of Stress : To some extent   Social Connections: Moderately Integrated (11/18/2018)    Received from [facility]    Social Connection and Isolation Panel [NHANES]     Frequency of Communication with Friends and Family: More than three times a week     Frequency of Social Gatherings with Friends and Family: More than three times a week     Attends Religious Services: More than 4 times per year     Active Member of [employer] or Organizations: Yes     Attends Engineer, structural: More than 4 times per year     Marital Status: Divorced   Intimate Partner Violence: Not At Risk (11/18/2018)    Received from [facility]    Humiliation, Afraid, Rape, and Kick questionnaire     Fear of Current or Ex-Partner: No     Emotionally Abused: No     Physically Abused: No      Sexually Abused: No   Housing Stability: Not on file      Occupation: On disability for Bipolar  Highest level of education: Associates degree    Family history:   Family history of neurological diseases: Yes - Comment: sister with MS    Allergies:  Lithium, Shellfish containing products, Doxycycline, Risperidone, Strawberry, Wellbutrin [bupropion hcl], and Latex    Current Medications[3]    Physical examination:  Vital signs: BP 99/75   Pulse 80   Ht 1.626 m   Wt 102.3 kg   BMI 38.72 kg/m    General: No acute distress, well-groomed, appears stated age  Head: Normocephalic, atraumatic.  Neck: Supple  Respiratory: Non labored breathing    Neurological exam:   Mental Status: Awake, oriented to person, place, time. Normal fund of knowledge. Fluent speech and comprehension. Able to name current and last [place]  president. Able to follow complex digiti specific commands that cross midline. Able to perform complex calculations to $0.4.     Cranial Nerves: Fundoscopy showed no papilledema or optic disc atrophy. VA (corrected): 20/20 OU. Visual fields full to confrontation. PERRL without evidence of APD. EOMI without signs of intranuclear ophthalmoplegia or nystagmus. V1-V3 intact to light touch bilaterally. Face and nasolabial folds symmetric. Hearing intact to finger rub, palate and tongue midline. Shoulder shrug is full strength bilaterally.  Rare nonpurposeful oral buccal facial movements that can be suppressed.    Motor Exam: Bulk is normal. Tone is normal in bilateral upper & lower extremities. There is no evidence of tremor. Akathisia in all limbs. Pronator drift negative.  Strength (R/L), MRC grading 0-5:   Upper extremities: Shoulder abduction 5/5, elbow flexion 5/5, elbow extension 5/5, wrist flexion 5/5, wrist extension 5/5, finger extension 5/5, interossei 5/5  Lower extremities:  Hip flexion 5/5, hip abduction 5/5, hip adduction 5/5, knee flexion 5/5, knee extension 5/5, ankle dorsiflexion 5/5, ankle plantar  flexion 5/5.    Sensation: Sensation to light touch and pinprick is intact in all extremities. Vibration is mildly impaired in both feet. Proprioception intact in both both feet. Romberg is negative    Coordination: Finger-to-nose shows no signs of dysmetria. Finger-tapping is fast, fluid and synchronized.     Deep tendon reflexes: (R/L): Brachioradialis 3+/3+, Biceps 3+/3+, Triceps 3+/3+, Patellar 3+/3+ with crossed adduction, negative Hoffman sign bilaterally.     Gait: Normal based gait, normal step and stride length, no spasticity, normal cadence, and arm swing. Able to tandem walk.    EDSS: 2.5 - Minimal disability in two FS (two FS grade 2,others 0 or 1).  Pyramidal Functions: 2 - Minimal disability: patient complains of motor-fatigability or reduced performance in strenuous motor tasks (BMRC grade 4 in one or two muscle groups)  Cerebellar Functions: 0 - Normal  Sensory Functions: 0 - Normal  Brainstem Functions: 0 - Normal  Bowel & Bladder Functions: 0 - Normal  Visual Functions: 0 - Normal  Cerebral (Cognitive) Functions: 2 - Mild decrease in mentation; moderate or severe fatigue     Test 08/20/2023 01/02/2024 Date (MM/YYYY) Date (MM/YYYY)   EDSS 2.5 2.5     SDMT 41 48     9-hole peg test Rt 22s, Lt 23s Rt 25.5s, Lt 24.3s     25 feet walk test 4.5s 5.17s         Relevant laboratory and imaging data (my direct review):  CSF studies (09/28/2023):  CSF protein: 37  CSF Glucose: 67  CSF WBCs (tube 4): 1  Oligoclonal bands: 5  IgG index: 1.3  Myelin basic protein: 2.2  Others: Normal CSF ACE, negative CSF VDRL, negative CSF Lyme antibodies.  Negative CSF cytology.  Insufficient sample for flow cytometry.  Negative autoimmune/paraneoplastic panel, negative MOG antibody.    Serum studies:  Test Positive Negative Not done Date (MM/YYYY) Notes   NMO antibody []  [x]  []  May 2025    MOG antibody []  [x]  []  May 2025    Autoimmune/paraneoplastic []  [x]  []  May 2025    Serum IL2R []  [x]  []  May 2025 Low level (222)   Sjogren  antibodies []  [x]  []  May 2025    ANCA []  [x]  []  May 2025    ANA []  [x]  []  May 2025      Test Positive Negative Indeterminate Date (MM/YYYY) Notes   JCV antibody []  []  []      TB Quantiferon []  [x]  []  June 2025    Hepatitis panel []  [x]  []  June 2025 Negative HBs antigen, HBc antibody, HCV antibody   HSV antibodies []  []  []      VZV antibodies [x]  []  []  June 2025 Positive IgG and negative IgM   EBV panel [x]  []  []  May 2025 Prior infection   Syphilis screen []  [x]  []  May 2025    Lyme screen []  [x]  []  May 2025      Test Level Date (MM/YYYY) Notes   Vitamin D level 09 Sep 2023    Vitamin B12 level 554 May 2025 Normal MMA and homocystine     Plasma NFL 1.20 September 2023      GFAP 36.24 September 2023      IgG 908 May 2025 Baseline       Imaging:  MRI brain WO 08/08/2023:  Several periventricular round/ovoid FLAIR hyperintensity in perivenular distribution that are consistent with CNS  demyelinating disease, there are also scattered deep white matter FLAIR hyperintensity but no juxtacortical or infratentorial lesions.    MRI cervical spine & thoracic spine W/WO 09/14/2023:  Small nonenhancing cord lesions at the C2-C3 and T3-T4 level. There is subtle heterogeneity of the remainder of the cord, though it is unclear whether these reflect tiny lesions or motion artifact.     Other studies:  None    Assessment:  MEYLIN STENZEL is 63 y.o.  right handed female who is being followed for CNS demyelinating disease.    Diagnosis discussion:  - Review of MRI brain showed several periventricular FLAIR hyperintensity that are round/ovoid with perivenular distribution that are characteristics of CNS demyelinating disease.  There are also deep white matter FLAIR hyperintensity that are round-ovoid with no corpus callosum lesions or infratentorial lesions.   - Review of MRI cervical and thoracic spine showed peripheral eccentric demyelinating lesion that are typical for multiple sclerosis.  - Patient meets 2017 McDonald criteria for relapsing  remitting multiple sclerosis.  She had typical clinical presentation (right facial weakness), symptoms were brief and no objective evidence of demyelination. Patient meets dissemination in space (periventricular, spinal cord lesions), and dissemination in time (5 oligoclonal bands). Although patient has behavioral health issues, no evidence of disability progression over the years.  - Prior workup for mimics showed negative Sjogren antibodies, NMO and MOG antibodies.  Onset of multiple sclerosis after the age of 63 is a red flag about the diagnosis.    Management discussion:  - Patient will benefit from continuin Sinemet g high efficacy disease modifying therapy given spinal cord lesions, B-cell depletion therapy will be preferred treatment options since they can help prevent further relapses as well as decrease risk of progression.  - Patient likely meet NEDA 3 criteria after starting DMT given no new symptoms or changes in neurological exam, she need repeat neuroimaging 6 months after starting DMT as well as plasma NFL to ensure disease control.    Other issues:  - Low back pain and spasticity: Patient was noted to have spasticity of lumbar paraspinal muscles which is likely related to multiple sclerosis, she will be treated with baclofen  and she will benefit from Botox in the future.  - Patient has multiple psychiatric comorbidities including anxiety, depression, PTSD, bipolar 1 disorder, she was treated with atypical antipsychotics for many years, currently treated with lamotrigine and SNRI.    Plan:  - DMT: Continue Briumvi  450 mg every 6 months.   - Imaging: Repeat MRI brain ICOMETRIX WO, MRI cervical and thoracic spine WO 6 months after starting DMT (January 2026).  - Labs:  - Multiple sclerosis monitoring profile (plasma level, GFAP) 3 months after starting DMT and then every 6 months.   - Immunoglobulin panel, CBC, CMP every 6 months  - Medications:   - Continue baclofen  10 mg twice daily for spasticity of  lower back, consider Botox as next step  - Continue lamotrigine 50 mg BID & venlafaxine 150 mg QD (prescribed by psychiatry)  - Increase dose of gabapentin  to 100 mg BID & 300 mg QHS (prescribed for pain)  - Continue physical therapy to help with balance training and strengthening exercises including aquatic therapy  - Follow-up with neurology in 3-4 months    Supportive treatment:  - Vitamin D supplementation 2000 units daily and vitamin B12 supplementation 1000 mcg daily.   - Patient can use CoQ10 400 mg daily and alpha lipoic acid 600 mg twice daily, they have some benefit in  metabolic supplementation to decrease risk of progression   - Patient should be aware about Uhthoff phenomenon during summer months.  - Patients with multiple sclerosis can use medical marijuana for pain and anxiety, it is recommended to use products that are third party tested.  - It is recommended for patients with multiple sclerosis to have regular exercise and eating well balanced mediterranean diet     Thank you for allowing me to participate in Reena FORBES Gander care. Please reach out to me or my office if any questions or concerns.    Sincerely,  Toya Daria Spears, MD  Neurologist, Neuroimmunologist, Neuroinfectious Diseases Specialist  [facility]  [facility], [facility]   Work Phone: [phone]  Work Fax: [phone]    Dictation Software was used to dictate this note and, despite all efforts to proofread, some dictation related unintentional errors or typos may occur. If you have any questions as the referring provider colleague, please do not hesitate to contact our office. If you are the patient reading this chart and reviewing the medical notes, please note that medical documentation is often written with abbreviations in medical terminology. ?Physician documentation is typically written by physicians for other healthcare providers' colleagues to review in  order to efficiently and briefly summarize the clinical encounters, tests that were ordered and interpreted; and the diagnosis in the most efficient and effective way possible.     A total of 31 min were spent to for medical decision making and counseling. Medical Decision Making involved reviewing relevant Medical records (as available), relevant Lab results (if accessible), relevant Radiology results & images (if accessible), relevant Procedures and diagnostic tests (as applicable), independent visualization of relevant neurologic testing(as needed), all questions answered to my best up-to-date Knowledge and understanding, test discussed with the performing provider (if applicable), relevant Medical records ordered (if needed). Counseling involved counseled regarding diagnostic results, instructions for management, risk factor reductions, prognosis, patient education, impressions, importance of compliance with treatment and risks and benefits of treatment options, as indicated. Counseled regarding Tobacco/EtoH cessation and proper use and storage of opioid medications (if indicated and relevant), counseled regarding importance of compliance with treatment and risks and benefits of treatment options. Additional time was spent in additional diagnostic and management research, and relevant up-to-date info, and medical record review, as needed. Prescription Drug Monitoring Program Report: MD or Assigned Delegate Reviewed patient's report as needed prior to prescribing Schedule II, III and IV medications that required review by law. Additional time was spent in care coordination including consultation with peers, staff, schedulers, nursing, and phone collaboration; as needed         [1]   Past Medical History:  Diagnosis Date    Anxiety     Brain concussion     Dementia (Multi-HCC)     Depression      Fibrocystic breast     Fibromyalgia, primary     Memory loss     MS (multiple sclerosis) (Multi-HCC)     Stroke  (Multi-HCC)    [2]   Past Surgical History:  Procedure Laterality Date    BREAST BIOPSY      KYPHOSIS SURGERY  1987   [3]   Current Outpatient Medications   Medication Sig Dispense Refill    acetaminophen  500 mg capsule Take 1 tablet by mouth if needed.      baclofen  (Lioresal ) 10 mg tablet Take 1 tablet (10 mg) by mouth twice daily. 60 tablet  0    cholecalciferol , vitamin D3, (Vitamin D3) 50 mcg (2,000 unit) capsule Take 1 capsule (50 mcg) by mouth once daily. 90 capsule 0    clindamycin (Cleocin T) 1 % lotion Apply topically twice daily.      diclofenac (Voltaren) 75 mg EC tablet Take 75 mg by mouth if needed in the morning and at bedtime.      fluticasone propionate (FLONASE NASL) Administer 2 sprays into affected nostril(s) in the morning.      gabapentin  (Neurontin ) 100 mg capsule Take 100 mg by mouth three times daily.      hydrOXYzine pamoate (Vistaril) 25 mg capsule Take 25 mg by mouth.      ketoconazole (NIZOral) 2 % cream Apply topically once daily.      lamoTRIgine (LaMICtal) 100 mg tablet Take 0.5 tablets by mouth twice daily.      multivit-min-FA-lycopen-lutein (Centrum Silver) 0.4 mg-300 mcg- 250 mcg tablet Take 1 tablet by mouth once daily.      thiamine  (Vitamin B-1) 50 mg tablet Take 1 tablet (50 mg) by mouth once daily. 90 tablet 3    venlafaxine XR (Effexor-XR) 150 mg 24 hr capsule Take 1 capsule by mouth in the morning.      zolpidem CR (Ambien CR) 12.5 mg ER tablet Take 5 mg by mouth in the morning.      hydrOXYzine HCL (Atarax) 25 mg tablet Take 25 mg by mouth if needed for itching. (Patient not taking: Reported on 01/02/2024)       No current facility-administered medications for this visit.

## 2024-01-10 LAB — IGG, IGA, IGM
IgA: 188 mg/dL (ref 87–352)
IgG: 907 mg/dL (ref 586–1602)
IgM: 59 mg/dL (ref 26–217)

## 2024-01-11 MED ORDER — thiamine (Vitamin B-1) 50 mg tablet
50 | ORAL_TABLET | Freq: Every day | ORAL | 3 refills | 2.00000 days | Status: AC
Start: 2024-01-11 — End: 2024-03-27

## 2024-01-11 MED ORDER — cholecalciferol, vitamin D3, (Vitamin D3) 50 mcg (2,000 unit) capsule
50 | ORAL_CAPSULE | Freq: Every day | ORAL | 0 refills | 90.00000 days | Status: AC
Start: 2024-01-11 — End: 2024-03-27

## 2024-01-11 NOTE — Telephone Encounter (Signed)
 Patient would like to know if you can send a script for   cholecalciferol  (VITAMIN D3) 2,000 unit capsule Take 2,000 Units by mouth daily.

## 2024-01-14 LAB — LABCORP MISCELLANEOUS
GFAPZSC: <0 {STDV} (ref <2.00)
Glial Fibrillary Acid Protein: 30.3 pg/mL (ref 0.00–186.00)
NFL, Serum Z Score: <0 {STDV} (ref <2.00)
Neurofilament Light Chain: 1.52 pg/mL (ref 0.00–3.65)

## 2024-01-15 ENCOUNTER — Encounter: Payer: MEDICARE | Attending: Student in an Organized Health Care Education/Training Program

## 2024-02-28 ENCOUNTER — Ambulatory Visit: Admit: 2024-02-28 | Discharge: 2024-02-28 | Payer: MEDICARE | Attending: Hospitalist

## 2024-02-28 DIAGNOSIS — G4733 Obstructive sleep apnea (adult) (pediatric): Secondary | ICD-10-CM

## 2024-02-28 NOTE — Addendum Note (Signed)
 Addended by: NENA CHANNEL on: 03/28/2024 09:04 AM     Modules accepted: Orders

## 2024-02-28 NOTE — Progress Notes (Signed)
Patient was given verbal and written instructions for the use of the WatchPat. Patient verbalized understanding of proper use of device. Patient was instructed to follow up with MD in 4-5 weeks for results.  Epworth:  Sitting and reading: Slight chance of dozing  Watching TV: Would never doze  Sitting, inactive in a public place (e.g. a theatre or a meeting): Would never doze  As a passenger in a car for an hour without a break: Slight chance of dozing  Lying down to rest in the afternoon when circumstances permit: Would never doze  Sitting and talking to someone: Would never doze  Sitting quietly after a lunch without alcohol: Would never doze  In a car, while stopped for a few minutes in traffic: Would never doze  Total score: 2

## 2024-03-11 LAB — HEMOGLOBIN A1C
Estimated Average Glucose mg/dL (INT/EXT): 117 mg/dL
HEMOGLOBIN A1C % (INT/EXT): 5.7 % — ABNORMAL HIGH (ref <5.6)

## 2024-03-14 ENCOUNTER — Ambulatory Visit: Admit: 2024-03-14 | Discharge: 2024-03-14 | Payer: MEDICARE

## 2024-03-14 LAB — URINALYSIS
Bacteria, Ur: NONE SEEN
Bilirubin, Ur: NEGATIVE
Blood, Ur: NEGATIVE
Casts, Ur: 0 /LPF (ref 0–4)
Epithelial Cells, UR: 4 {cells}/[HPF] (ref 0–5)
Glucose,Ur: NEGATIVE mg/dL
Ketones, Ur: NEGATIVE mg/dL
Nitrite, Ur: NEGATIVE
Protein,Ur: NEGATIVE mg/dL
RBC, Ur: 1 {cells}/[HPF] (ref 0–4)
Specific Gravity, Ur: 1.01 (ref 1.005–1.030)
Urobilinogen, Ur: 1 U/dL (ref 0.2–1.0)
WBC, Ur: 18 {cells}/[HPF] — ABNORMAL HIGH (ref 0–5)
pH, Ur: 7 (ref 5.0–8.0)

## 2024-03-14 LAB — MRSA/SA BY PCR
MRSA: NOT DETECTED
S. aureus PCR: NOT DETECTED

## 2024-03-14 NOTE — Anesthesia Preprocedure Evaluation (Addendum)
 Preoperative Information:   Procedure Summary       Date: 03/25/24 Room / Location: [facility] OR 11 / [facility] Operating Room    Anesthesia Start:  Anesthesia Stop:     Procedure: Arthroplasty, Knee, Total (Right: Knee) Diagnosis:       Unilateral primary osteoarthritis, right knee      (Unilateral primary osteoarthritis, right knee [M17.11])    Surgeons: Jayson JONETTA Police, MD Responsible Provider:     Anesthesia Type: Not recorded ASA Status: Not recorded            Anesthesia Type: No value filed.    Anesthesiologist scheduled: Laymon Rose, NP    Re-eval prior to induction:   Initial eval reviewed: Other: to be reviewed DOS  Prior Anesthesia Hx no known complications   Prior Family Anesthesia Hx    Allergies[1]     Health Status        Relevant Problems   Cardio   (+) Essential hypertension   (+) Hyperlipidemia   (+) Mixed hyperlipidemia      Pulmonary   (+) Unspecified asthma, uncomplicated      Endo   (+) Morbid obesity   (+) Obesity   (+) Obesity due to excess calories      Hematology   (+) Anemia   (+) Anemia, unspecified      Neuro/Psych   (+) Anxiety   (+) Attention-deficit hyperactivity disorder, predominantly inattentive type   (+) Chronic pain   (+) Dementia   (+) Depression   (+) Mild recurrent major depression   (+) Mixed anxiety and depressive disorder   (+) Panic attack   (+) Posttraumatic stress disorder   (+) Stroke      Musculoskeletal   (+) Arthritis   (+) Fibromyalgia   (+) Primary osteoarthritis of both knees   (+) Psoriatic arthritis   (+) Unilateral primary osteoarthritis, left knee   (+) Unilateral primary osteoarthritis, right knee        History of Present Illness  Presents with right knee OA    Past Medical History  Medical History[2]     Current Medications[3]     Surgical History[4]     Tobacco Use History[5]     Social History     Substance and Sexual Activity   Alcohol Use Yes    Alcohol/week: 1.0 standard drink of alcohol    Types: 1 Glasses of wine per week        Review of Systems    Constitutional: Negative.    HENT: Negative.     Respiratory:          SOBOE   Awaiting sleep study results   Cardiovascular:  Negative for chest pain and palpitations.   Musculoskeletal:  Positive for joint pain (bilateral knees).        Uses cane for uneven surfaces and OA pain  Right knee pain   Left knee significant pain- had cortisone injection 2 months ago. Trying to get into pain clinic as it is too early to have knee replacement     Kyphosis surgery listed in surgical hx- patient reports no prior surgery on back    Skin: Negative.    Neurological:         Relapsing remitting multiple sclerosis. infusions q 6 months  - stable, reports no recent falls, memory has improved    Endo/Heme/Allergies:         Goiter    Psychiatric/Behavioral:  Positive for depression. The patient is nervous/anxious.  PTSD , bipolar, mood is stable          Physical Exam  Physical Exam    Airway  Mallampati: II  TM distance: >3 FB  Mouth opening: >3 FB  Neck ROM: full  Comments: Corner of mouth is drooped , mild jaw misalignment per patient      Cardiovascular - normal exam    Rhythm: regular  Rate: normal  Functional capacity:  Activity limited by pain greater than or equal to 4 METS without symptoms   Dental   Comments: Implants and crowns in back secured      Pulmonary - normal exam   Abdominal (+) obese     General   Alert                    Vital signs  Visit Vitals  BP (!) 143/74   Pulse 77   Resp 18   Ht 1.651 m   Wt 108.4 kg   SpO2 98%   BMI 39.77 kg/m   OB Status Postmenopausal   BSA 2.23 m        Labs Results Interpretation: Other: labs done by pcp  03/11/24- okay to proceed   Lab Results   Component Value Date    GLUCOSE 83 09/21/2023    CALCIUM 9.6 09/21/2023    NA 142 09/21/2023    K 4.0 09/21/2023    CO2 22 09/21/2023    CL 107 (H) 09/21/2023    BUN 16 09/21/2023    CREATININE 0.70 09/21/2023      Lab Results   Component Value Date    WBC 5.9 09/21/2023    HGB 11.8 09/21/2023    HCT 36.5 09/21/2023    MCV 83  09/21/2023    PLT 242 09/21/2023          No results found for: PTT   Lab Results   Component Value Date    INR 1.0 09/21/2023              Chest X-ray Results  XR CHEST 2 VIEWS 04/19/2023    Narrative  COMPARISON:  None    FINDINGS AND    Impression  No consolidation, pleural effusion, or pneumothorax.  Normal heart size. No pulmonary vascular congestion.  No acute displaced fracture identified.             ECG Interpretation: No results found for this or any previous visit (from the past 4464 hours). NSR           03/11/24 PCP preop medically cleared     Assessment and Plan  Anesthesia Plan    ASA 3     MAC, spinal and general     (GA as back up   Adductor canal block discussed )Monitoring: standard monitors  Postoperative Pain Control: IV/PO analgesics and Peripheral Nerve Block (consent obtained)    Multimodal PONV prophylaxis planned  Essential imaging and labs available and reviewed    Anesthetic plan and risks discussed with patient.                   [1]   Allergies  Allergen Reactions    Doxycycline Diarrhea and Nausea / Vomiting    Lithium Hives and Rash     Exacerbates eczema and psoriasis       Risperidone      I'm not in my right mind, causes pacing, anxiety, insomnia    Shellfish Containing Products Nausea And Vomiting, Rash and  Swelling    Strawberry      Tingling In Throat    Wellbutrin [Bupropion Hcl] Other     Senses overwhelmed    Latex Itching   [2]   Past Medical History:  Diagnosis Date    Anxiety     Brain concussion     Depression     Fibrocystic breast     Fibromyalgia, primary     Manic bipolar I disorder     Memory loss     MS (multiple sclerosis)     Follows With [facility]    Psoriatic arthritis     PTSD (post-traumatic stress disorder)     Unilateral primary osteoarthritis, right knee    [3]   Current Outpatient Medications   Medication Sig Dispense Refill    venlafaxine  XR (Effexor -XR) 75 mg 24 hr capsule Take 75 mg by mouth in the morning. Do not crush or chew.       acetaminophen  500 mg capsule Take 1 tablet by mouth if needed.      baclofen  (Lioresal ) 10 mg tablet Take 1 tablet (10 mg) by mouth twice daily. 180 tablet 0    cholecalciferol , vitamin D3, (Vitamin D3) 50 mcg (2,000 unit) capsule Take 1 capsule (50 mcg) by mouth once daily. 90 capsule 0    clindamycin (Cleocin T) 1 % lotion Apply topically twice daily.      diclofenac (Voltaren) 75 mg EC tablet Take 75 mg by mouth if needed in the morning and at bedtime.      fluticasone propionate (FLONASE NASL) Administer 2 sprays into affected nostril(s) in the morning.      gabapentin  (Neurontin ) 100 mg capsule Take 1 capsule (100 mg) by mouth twice daily AND 3 capsules (300 mg) at bedtime. 450 capsule 0    hydrOXYzine  HCL (Atarax ) 25 mg tablet Take 25 mg by mouth if needed for itching. (Patient not taking: Reported on 01/02/2024)      hydrOXYzine  pamoate (Vistaril ) 25 mg capsule Take 25 mg by mouth.      ketoconazole (NIZOral) 2 % cream Apply topically once daily.      lamoTRIgine  (LaMICtal ) 100 mg tablet Take 0.5 tablets by mouth twice daily.      multivit-min-FA-lycopen-lutein (Centrum Silver) 0.4 mg-300 mcg- 250 mcg tablet Take 1 tablet by mouth once daily.      thiamine  (Vitamin B-1) 50 mg tablet Take 1 tablet (50 mg) by mouth once daily. 90 tablet 3    venlafaxine  XR (Effexor -XR) 150 mg 24 hr capsule Take 1 capsule by mouth in the morning. (Patient not taking: Reported on 03/14/2024)      zolpidem  CR (Ambien  CR) 12.5 mg ER tablet Take 5 mg by mouth in the morning.       No current facility-administered medications for this visit.   [4]   Past Surgical History:  Procedure Laterality Date    BREAST BIOPSY      KYPHOSIS SURGERY  1987   [5]   Social History  Tobacco Use   Smoking Status Never   Smokeless Tobacco Never

## 2024-03-14 NOTE — Preprocedure Instructions (Signed)
 Medication List            Accurate as of March 14, 2024  8:40 AM. Always use your most recent med list.                acetaminophen  500 mg capsule  Medication Adjustments for Surgery: Other (Comment)  Notes to patient: DO NOT HAVE TO STOP, TAKE IF NEEDED      Ambien CR 12.5 mg ER tablet  Generic drug: zolpidem CR  Medication Adjustments for Surgery: Continue until night before surgery     ascorbic acid 1,000 mg tablet  Commonly known as: Vitamin C  Medication Adjustments for Surgery: Stop 7 days before surgery     baclofen  10 mg tablet  Commonly known as: Lioresal   Take 1 tablet (10 mg) by mouth twice daily.  Medication Adjustments for Surgery: Take morning of surgery with sip of water, no other fluids     betamethasone dipropionate 0.05 % ointment  Commonly known as: Diprosone  Medication Adjustments for Surgery: Other (Comment)  Notes to patient: DO NOT APPLY OINTMENT ON DAY OF SURGERY     BRIUMVI  IV  Medication Adjustments for Surgery: Other (Comment)  Notes to patient: LAST DOSE 11/26/2023     cholecalciferol  (vitamin D3) 50 mcg (2,000 unit) capsule  Commonly known as: Vitamin D3  Take 1 capsule (50 mcg) by mouth once daily.  Medication Adjustments for Surgery: Stop 7 days before surgery     clindamycin 1 % lotion  Commonly known as: Cleocin T     clobetasol 0.05 % ointment  Commonly known as: Temovate  Medication Adjustments for Surgery: Other (Comment)  Notes to patient: DO NOT APPLY OINTMENT ON DAY OF SURGERY     diclofenac 75 mg EC tablet  Commonly known as: Voltaren  Medication Adjustments for Surgery: Stop 7 days before surgery     ferrous gluconate 324 (37.5 Fe) MG tablet  Commonly known as: Fergon  Medication Adjustments for Surgery: Other (Comment)  Notes to patient: DO NOT TAKE ON DAY OF SURGERY      FLONASE NASL  Medication Adjustments for Surgery: Other (Comment)  Notes to patient: DO NOT HAVE TO STOP, TAKE IF NEEDED      folic acid 1 mg tablet  Commonly known as: Folvite  Medication  Adjustments for Surgery: Other (Comment)  Notes to patient: DO NOT TAKE ON DAY OF SURGERY      gabapentin  100 mg capsule  Commonly known as: Neurontin   Take 1 capsule (100 mg) by mouth twice daily AND 3 capsules (300 mg) at bedtime.  Medication Adjustments for Surgery: Take morning of surgery with sip of water, no other fluids     hydrOXYzine HCL 25 mg tablet  Commonly known as: Atarax  Medication Adjustments for Surgery: Other (Comment)  Notes to patient: NOT TAKING - DUPLICATE     hydrOXYzine pamoate 25 mg capsule  Commonly known as: Vistaril  Medication Adjustments for Surgery: Other (Comment)  Notes to patient: DO NOT HAVE TO STOP, TAKE IF NEEDED      ketoconazole 2 % cream  Commonly known as: NIZOral  Medication Adjustments for Surgery: Other (Comment)  Notes to patient: DO NOT APPLY CREAM ON DAY OF SURGERY     lamoTRIgine 100 mg tablet  Commonly known as: LaMICtal  Medication Adjustments for Surgery: Take morning of surgery with sip of water, no other fluids     lurasidone 80 mg tablet  Commonly known as: Latuda  Medication Adjustments for  Surgery: Continue until night before surgery     multivit-min-FA-lycopen-lutein 0.4 mg-300 mcg- 250 mcg tablet  Commonly known as: Centrum Silver  Medication Adjustments for Surgery: Other (Comment)  Notes to patient: NOT TAKING     thiamine  50 mg tablet  Commonly known as: Vitamin B-1  Take 1 tablet (50 mg) by mouth once daily.  Medication Adjustments for Surgery: Stop 7 days before surgery     traZODone 50 mg tablet  Commonly known as: Desyrel  Medication Adjustments for Surgery: Continue until night before surgery     * venlafaxine XR 75 mg 24 hr capsule  Commonly known as: Effexor-XR  Medication Adjustments for Surgery: Other (Comment)  Notes to patient: NOT TAKING     * venlafaxine XR 150 mg 24 hr capsule  Commonly known as: Effexor-XR  Medication Adjustments for Surgery: Take morning of surgery with sip of water, no other fluids           * This list has 2  medication(s) that are the same as other medications prescribed for you. Read the directions carefully, and ask your doctor or other care provider to review them with you.                       Additional Instructions        Advised patient:      - Surgery time may change and if it does, they will receive a phone call after 3 pm the night before surgery (if your surgery is on a Monday, they will call on Friday).    Do not eat after midnight the night before your surgical procedure. You may drink clear liquids (water, apple juice, Gatorade) up until 3 hours before your procedure time, unless otherwise instructed.     - One visitor is allowed at this time and they must be over the age of 14 years    - Pre-Surgical drinks (Ensure Clear) - USE ONLY IF INSTRUCTED TO DO SO    - If you use a CPAP machine at home, bring it with you on the day of surgery    - If you use a rescue inhaler (albuterol), bring it with you on the day of surgery    - Shower is okay on the morning of surgery- If given Hibiclens (special soap), use as instructed    - No shaving at or near procedure site    - Arrange an escort home and family care overnight (should be someone over 18 yrs)    - Neuro/back surgery: bring MRI CD     - Bring list of medications if appropriate    - Complete Bowel prep if appropriate    - Take medications as instructed during your Prescreening appointment    - Quit smoking to help prevent infections    - Don't wear any make up, jewelry, body piercings, or contact lenses     - DO NOT bring any cash or valuables with you     - Wear comfortable clothing: an outfit that is easy to take on and off, especially around your surgical site    - Please bring photo ID and insurance card    -Call Liaison in Surgical Day Care - [phone] with any questions          DAY OF SURGERY:      Report to Surgical Day Care Unit    Surgical Day Care is located in the basement of the [facility]  Enter the Hospital using the Main entrance and once  in lobby take the elevators down, in elevator push DB

## 2024-03-14 NOTE — Perioperative Nursing Note (Signed)
 Patient given:    - Patient Guide To Knee Replacement Surgery     - MRSA/MSSA Protocol (Nasal Swab Screening), preprinted instructions    - Hibiclens Antiseptic Skin Cleanser    - Total Joint Replacement Pre-Operative Education    Patient verbalized understanding of instructions and all questions were answered.

## 2024-03-14 NOTE — Preprocedure Instructions (Deleted)
 Medication List            Accurate as of March 14, 2024  8:37 AM. Always use your most recent med list.                acetaminophen  500 mg capsule  Medication Adjustments for Surgery: Other (Comment)  Notes to patient: DO NOT HAVE TO STOP, TAKE IF NEEDED      Ambien CR 12.5 mg ER tablet  Generic drug: zolpidem CR  Medication Adjustments for Surgery: Continue until night before surgery     ascorbic acid 1,000 mg tablet  Commonly known as: Vitamin C  Medication Adjustments for Surgery: Stop 7 days before surgery     baclofen  10 mg tablet  Commonly known as: Lioresal   Take 1 tablet (10 mg) by mouth twice daily.  Medication Adjustments for Surgery: Take morning of surgery with sip of water, no other fluids     betamethasone dipropionate 0.05 % ointment  Commonly known as: Diprosone  Medication Adjustments for Surgery: Other (Comment)  Notes to patient: DO NOT APPLY OINTMENT ON DAY OF SURGERY     cholecalciferol  (vitamin D3) 50 mcg (2,000 unit) capsule  Commonly known as: Vitamin D3  Take 1 capsule (50 mcg) by mouth once daily.  Medication Adjustments for Surgery: Stop 7 days before surgery     clindamycin 1 % lotion  Commonly known as: Cleocin T     clobetasol 0.05 % ointment  Commonly known as: Temovate  Medication Adjustments for Surgery: Other (Comment)  Notes to patient: DO NOT APPLY OINTMENT ON DAY OF SURGERY     diclofenac 75 mg EC tablet  Commonly known as: Voltaren  Medication Adjustments for Surgery: Stop 7 days before surgery     ferrous gluconate 324 (37.5 Fe) MG tablet  Commonly known as: Fergon  Medication Adjustments for Surgery: Other (Comment)  Notes to patient: DO NOT TAKE ON DAY OF SURGERY      FLONASE NASL  Medication Adjustments for Surgery: Other (Comment)  Notes to patient: DO NOT HAVE TO STOP, TAKE IF NEEDED      folic acid 1 mg tablet  Commonly known as: Folvite  Medication Adjustments for Surgery: Other (Comment)  Notes to patient: DO NOT TAKE ON DAY OF SURGERY      gabapentin  100 mg  capsule  Commonly known as: Neurontin   Take 1 capsule (100 mg) by mouth twice daily AND 3 capsules (300 mg) at bedtime.  Medication Adjustments for Surgery: Take morning of surgery with sip of water, no other fluids     hydrOXYzine HCL 25 mg tablet  Commonly known as: Atarax  Medication Adjustments for Surgery: Other (Comment)  Notes to patient: NOT TAKING - DUPLICATE     hydrOXYzine pamoate 25 mg capsule  Commonly known as: Vistaril  Medication Adjustments for Surgery: Other (Comment)  Notes to patient: DO NOT HAVE TO STOP, TAKE IF NEEDED      ketoconazole 2 % cream  Commonly known as: NIZOral  Medication Adjustments for Surgery: Other (Comment)  Notes to patient: DO NOT APPLY CREAM ON DAY OF SURGERY     lamoTRIgine 100 mg tablet  Commonly known as: LaMICtal  Medication Adjustments for Surgery: Take morning of surgery with sip of water, no other fluids     lurasidone 80 mg tablet  Commonly known as: Latuda  Medication Adjustments for Surgery: Continue until night before surgery     multivit-min-FA-lycopen-lutein 0.4 mg-300 mcg- 250 mcg tablet  Commonly known  as: Centrum Silver  Medication Adjustments for Surgery: Other (Comment)  Notes to patient: NOT TAKING     thiamine  50 mg tablet  Commonly known as: Vitamin B-1  Take 1 tablet (50 mg) by mouth once daily.  Medication Adjustments for Surgery: Stop 7 days before surgery     traZODone 50 mg tablet  Commonly known as: Desyrel  Medication Adjustments for Surgery: Continue until night before surgery     * venlafaxine XR 75 mg 24 hr capsule  Commonly known as: Effexor-XR  Medication Adjustments for Surgery: Other (Comment)  Notes to patient: NOT TAKING     * venlafaxine XR 150 mg 24 hr capsule  Commonly known as: Effexor-XR  Medication Adjustments for Surgery: Take morning of surgery with sip of water, no other fluids           * This list has 2 medication(s) that are the same as other medications prescribed for you. Read the directions carefully, and ask your doctor  or other care provider to review them with you.                       Additional Instructions        Advised patient:      - Surgery time may change and if it does, they will receive a phone call after 3 pm the night before surgery (if your surgery is on a Monday, they will call on Friday).    Do not eat after midnight the night before your surgical procedure. You may drink clear liquids (water, apple juice, Gatorade) up until 3 hours before your procedure time, unless otherwise instructed.     - One visitor is allowed at this time and they must be over the age of 99 years    - Pre-Surgical drinks (Ensure Clear) - USE ONLY IF INSTRUCTED TO DO SO    - If you use a CPAP machine at home, bring it with you on the day of surgery    - If you use a rescue inhaler (albuterol), bring it with you on the day of surgery    - Shower is okay on the morning of surgery- If given Hibiclens (special soap), use as instructed    - No shaving at or near procedure site    - Arrange an escort home and family care overnight (should be someone over 18 yrs)    - Neuro/back surgery: bring MRI CD     - Bring list of medications if appropriate    - Complete Bowel prep if appropriate    - Take medications as instructed during your Prescreening appointment    - Quit smoking to help prevent infections    - Don't wear any make up, jewelry, body piercings, or contact lenses     - DO NOT bring any cash or valuables with you     - Wear comfortable clothing: an outfit that is easy to take on and off, especially around your surgical site    - Please bring photo ID and insurance card    -Call Liaison in Surgical Day Care - [phone] with any questions          DAY OF SURGERY:      Report to Surgical Day Care Unit    Surgical Day Care is located in the basement of the Hospital    Enter the Hospital using the Main entrance and once in lobby take the elevators down, in elevator push  DB

## 2024-03-15 LAB — URINE CULTURE

## 2024-03-25 MED ORDER — SODIUM CHLORIDE 0.9 % INTRAVENOUS SOLUTION
0.9 | INTRAVENOUS | Status: DC
Start: 2024-03-25 — End: 2024-03-27

## 2024-03-25 MED ORDER — ZOLPIDEM 5 MG TABLET
5 | Freq: Every evening | ORAL | Status: DC | PRN
Start: 2024-03-25 — End: 2024-03-27
  Administered 2024-03-26: 02:00:00 5 mg via ORAL

## 2024-03-25 MED ORDER — FENTANYL (PF) 50 MCG/ML INJECTION SOLUTION
50 | INTRAMUSCULAR | Status: DC | PRN
Start: 2024-03-25 — End: 2024-03-25

## 2024-03-25 MED ORDER — BUPIVACAINE (PF) 0.25 % (2.5 MG/ML) INJECTION SOLUTION
0.25 | INTRAMUSCULAR | Status: AC
Start: 2024-03-25 — End: 2024-03-25

## 2024-03-25 MED ORDER — DOCUSATE SODIUM 50 MG/5 ML ORAL LIQUID
50 | Freq: Two times a day (BID) | ORAL | Status: DC
Start: 2024-03-25 — End: 2024-03-27

## 2024-03-25 MED ORDER — BISACODYL 5 MG TABLET,DELAYED RELEASE
5 | Freq: Every day | ORAL | Status: DC | PRN
Start: 2024-03-25 — End: 2024-03-27
  Administered 2024-03-27: 14:00:00 10 mg via ORAL

## 2024-03-25 MED ORDER — VANCOMYCIN 1,000 MG INTRAVENOUS INJECTION
1000 | INTRAVENOUS | Status: DC | PRN
Start: 2024-03-25 — End: 2024-03-25
  Administered 2024-03-25 (×2): 1 via TOPICAL

## 2024-03-25 MED ORDER — BUPIVACAINE LIPOSOME(PF) 1.3 %(13.3 MG/ML) SUSPENSION FOR INFILTRATION
1.3 | Status: DC | PRN
Start: 2024-03-25 — End: 2024-03-25
  Administered 2024-03-25: 17:00:00 133

## 2024-03-25 MED ORDER — PHENYLEPHRINE 10 MG/ML INJECTION SOLUTION
10 | INTRAMUSCULAR | Status: AC
Start: 2024-03-25 — End: 2024-03-25

## 2024-03-25 MED ORDER — TRANEXAMIC ACID 1,000 MG/10 ML (100 MG/ML) INTRAVENOUS SOLUTION
1000 | INTRAVENOUS | Status: AC
Start: 2024-03-25 — End: 2024-03-25

## 2024-03-25 MED ORDER — LURASIDONE 20 MG TABLET
20 | Freq: Every evening | ORAL | Status: DC
Start: 2024-03-25 — End: 2024-03-27
  Administered 2024-03-26 – 2024-03-27 (×2): 80 mg via ORAL

## 2024-03-25 MED ORDER — ACETAMINOPHEN 325 MG TABLET
325 | Freq: Once | ORAL | Status: DC
Start: 2024-03-25 — End: 2024-03-25

## 2024-03-25 MED ORDER — SODIUM CHLORIDE 0.9 % IRRIGATION SOLUTION
0.9 | Status: DC | PRN
Start: 2024-03-25 — End: 2024-03-25
  Administered 2024-03-25 (×2): 1000

## 2024-03-25 MED ORDER — DEXMEDETOMIDINE 80 MCG/20 ML (4 MCG/ML) IN 0.9 % SODIUM CHLORIDE IV
4 | INTRAVENOUS | Status: DC | PRN
Start: 2024-03-25 — End: 2024-03-25
  Administered 2024-03-25: 17:00:00 12 via INTRAVENOUS

## 2024-03-25 MED ORDER — DEXAMETHASONE SODIUM PHOSPHATE 4 MG/ML INJECTION SOLUTION
4 | Freq: Once | INTRAMUSCULAR | Status: AC | PRN
Start: 2024-03-25 — End: 2024-03-25
  Administered 2024-03-25: 16:00:00 4 via PERINEURAL

## 2024-03-25 MED ORDER — MAGNESIUM CITRATE ORAL SOLUTION
Freq: Once | ORAL | Status: DC | PRN
Start: 2024-03-25 — End: 2024-03-27

## 2024-03-25 MED ORDER — HYDROMORPHONE 1 MG/ML INJECTION WRAPPER
1 | INTRAMUSCULAR | Status: AC
Start: 2024-03-25 — End: 2024-03-25

## 2024-03-25 MED ORDER — ROPIVACAINE (PF) 5 MG/ML (0.5 %) INJECTION SOLUTION
5 | Freq: Once | INTRAMUSCULAR | Status: AC | PRN
Start: 2024-03-25 — End: 2024-03-25
  Administered 2024-03-25: 16:00:00 25 via PERINEURAL

## 2024-03-25 MED ORDER — HYDROXYZINE HCL 25 MG TABLET
25 | Freq: Every day | ORAL | Status: DC
Start: 2024-03-25 — End: 2024-03-27
  Administered 2024-03-26 – 2024-03-27 (×2): 25 mg via ORAL

## 2024-03-25 MED ORDER — ACETAMINOPHEN 325 MG TABLET
325 | Freq: Three times a day (TID) | ORAL | Status: DC
Start: 2024-03-25 — End: 2024-03-27
  Administered 2024-03-26 – 2024-03-27 (×6): 975 mg via ORAL

## 2024-03-25 MED ORDER — LAMOTRIGINE 25 MG TABLET
25 | Freq: Two times a day (BID) | ORAL | Status: DC
Start: 2024-03-25 — End: 2024-03-27
  Administered 2024-03-26 – 2024-03-27 (×3): 50 mg via ORAL

## 2024-03-25 MED ORDER — PROPOFOL 10 MG/ML INTRAVENOUS EMULSION
10 | INTRAVENOUS | Status: AC
Start: 2024-03-25 — End: 2024-03-25

## 2024-03-25 MED ORDER — ACETAMINOPHEN 325 MG TABLET
325 | ORAL | Status: AC
Start: 2024-03-25 — End: 2024-03-25

## 2024-03-25 MED ORDER — ONDANSETRON HCL (PF) 4 MG/2 ML INJECTION SOLUTION
4 | INTRAMUSCULAR | Status: DC | PRN
Start: 2024-03-25 — End: 2024-03-25
  Administered 2024-03-25: 19:00:00 4 mg via INTRAVENOUS

## 2024-03-25 MED ORDER — ACETAMINOPHEN 325 MG/10.15 ML ORAL SOLUTION
325 | Freq: Three times a day (TID) | ORAL | Status: DC
Start: 2024-03-25 — End: 2024-03-27

## 2024-03-25 MED ORDER — PROPOFOL 10 MG/ML INTRAVENOUS EMULSION
10 | INTRAVENOUS | Status: DC | PRN
Start: 2024-03-25 — End: 2024-03-25
  Administered 2024-03-25: 17:00:00 100 via INTRAVENOUS

## 2024-03-25 MED ORDER — SODIUM CHLORIDE 0.9 % INTRAVENOUS PIGGYBACK
Freq: Once | INTRAVENOUS | Status: DC
Start: 2024-03-25 — End: 2024-03-25

## 2024-03-25 MED ORDER — ONDANSETRON HCL (PF) 4 MG/2 ML INJECTION SOLUTION
4 | Freq: Three times a day (TID) | INTRAMUSCULAR | Status: DC | PRN
Start: 2024-03-25 — End: 2024-03-27

## 2024-03-25 MED ORDER — OXYCODONE 5 MG TABLET
5 | ORAL | Status: DC | PRN
Start: 2024-03-25 — End: 2024-03-27
  Administered 2024-03-25 – 2024-03-27 (×8): 10 mg via ORAL

## 2024-03-25 MED ORDER — MORPHINE 2 MG/ML INJECTION WRAPPER
2 | INTRAMUSCULAR | Status: AC | PRN
Start: 2024-03-25 — End: 2024-03-26

## 2024-03-25 MED ORDER — FENTANYL (PF) 50 MCG/ML INJECTION SOLUTION
50 | INTRAMUSCULAR | Status: DC | PRN
Start: 2024-03-25 — End: 2024-03-25
  Administered 2024-03-25: 16:00:00 50 via INTRAVENOUS
  Administered 2024-03-25 (×2): 25 via INTRAVENOUS

## 2024-03-25 MED ORDER — SODIUM CHLORIDE 0.9 % (FLUSH) INJECTION SYRINGE
Freq: Three times a day (TID) | INTRAMUSCULAR | Status: DC | PRN
Start: 2024-03-25 — End: 2024-03-25

## 2024-03-25 MED ORDER — ONDANSETRON 4 MG DISINTEGRATING TABLET
4 | Freq: Three times a day (TID) | ORAL | Status: DC | PRN
Start: 2024-03-25 — End: 2024-03-27

## 2024-03-25 MED ORDER — BUPIVACAINE-EPINEPHRINE 0.25 %-1:200,000 INJECTION SOLUTION
0.25 | INTRAMUSCULAR | Status: DC | PRN
Start: 2024-03-25 — End: 2024-03-25
  Administered 2024-03-25: 17:00:00 15

## 2024-03-25 MED ORDER — BUPIVACAINE (PF) 0.75 % (7.5 MG/ML) IN 8.25 % DEXTROSE INJECTION
0.75 | Freq: Once | INTRAMUSCULAR | Status: AC | PRN
Start: 2024-03-25 — End: 2024-03-25
  Administered 2024-03-25: 16:00:00 1.4 via INTRATHECAL

## 2024-03-25 MED ORDER — GABAPENTIN 300 MG CAPSULE
300 | Freq: Three times a day (TID) | ORAL | Status: DC
Start: 2024-03-25 — End: 2024-03-27
  Administered 2024-03-26 – 2024-03-27 (×6): 300 mg via ORAL

## 2024-03-25 MED ORDER — TRANEXAMIC ACID 1,000 MG/10 ML (100 MG/ML) INTRAVENOUS SOLUTION
1000 | Freq: Once | INTRAVENOUS | Status: DC
Start: 2024-03-25 — End: 2024-03-25

## 2024-03-25 MED ORDER — GENTAMICIN 80 MG/100 ML IN SODIUM CHLORIDE(ISO) INTRAVENOUS PIGGYBACK
80 | INTRAVENOUS | Status: AC
Start: 2024-03-25 — End: 2024-03-25

## 2024-03-25 MED ORDER — BACLOFEN 10 MG TABLET
10 | Freq: Two times a day (BID) | ORAL | Status: DC
Start: 2024-03-25 — End: 2024-03-27
  Administered 2024-03-26 – 2024-03-27 (×4): 10 mg via ORAL

## 2024-03-25 MED ORDER — OXYCODONE 5 MG TABLET
5 | ORAL | Status: DC | PRN
Start: 2024-03-25 — End: 2024-03-27

## 2024-03-25 MED ORDER — METOCLOPRAMIDE 5 MG/ML INJECTION SOLUTION
5 | Freq: Four times a day (QID) | INTRAMUSCULAR | Status: DC | PRN
Start: 2024-03-25 — End: 2024-03-27

## 2024-03-25 MED ORDER — MIDAZOLAM 1 MG/ML INJECTION SOLUTION WRAPPER
1 | INTRAMUSCULAR | Status: AC
Start: 2024-03-25 — End: 2024-03-25

## 2024-03-25 MED ORDER — ROPIVACAINE (PF) 5 MG/ML (0.5 %) INJECTION SOLUTION
5 | INTRAMUSCULAR | Status: AC
Start: 2024-03-25 — End: 2024-03-25

## 2024-03-25 MED ORDER — METOCLOPRAMIDE 10 MG TABLET
10 | Freq: Four times a day (QID) | ORAL | Status: DC | PRN
Start: 2024-03-25 — End: 2024-03-27

## 2024-03-25 MED ORDER — TRAZODONE 50 MG TABLET
50 | Freq: Every evening | ORAL | Status: DC
Start: 2024-03-25 — End: 2024-03-25
  Administered 2024-03-26: 02:00:00 50 mg via ORAL

## 2024-03-25 MED ORDER — DEXAMETHASONE SODIUM PHOSPHATE 4 MG/ML INJECTION SOLUTION
4 | INTRAMUSCULAR | Status: AC
Start: 2024-03-25 — End: 2024-03-25

## 2024-03-25 MED ORDER — PHENYLEPHRINE 10 MG/ML INJECTION SOLUTION
10 | INTRAMUSCULAR | Status: DC | PRN
Start: 2024-03-25 — End: 2024-03-25
  Administered 2024-03-25: 17:00:00 .3 via INTRAVENOUS

## 2024-03-25 MED ORDER — DIPHENHYDRAMINE 25 MG CAPSULE
25 | Freq: Four times a day (QID) | ORAL | Status: DC | PRN
Start: 2024-03-25 — End: 2024-03-27

## 2024-03-25 MED ORDER — APIXABAN 2.5 MG TABLET
2.5 | Freq: Two times a day (BID) | ORAL | Status: DC
Start: 2024-03-25 — End: 2024-03-27
  Administered 2024-03-26 – 2024-03-27 (×3): 2.5 mg via ORAL

## 2024-03-25 MED ORDER — PHENYLEPHRINE 0.4 MG/10 ML (40 MCG/ML) IN 0.9 %SOD.CHLORIDE IV SYRINGE
0.4 | INTRAVENOUS | Status: DC | PRN
Start: 2024-03-25 — End: 2024-03-25
  Administered 2024-03-25: 17:00:00 80 via INTRAVENOUS

## 2024-03-25 MED ORDER — SODIUM CHLORIDE 0.9 % INTRAVENOUS PIGGYBACK
Freq: Once | INTRAVENOUS | Status: AC
Start: 2024-03-25 — End: 2024-03-25
  Administered 2024-03-25: 21:00:00 1000 mg via INTRAVENOUS

## 2024-03-25 MED ORDER — BUPIVACAINE LIPOSOME(PF) 1.3 %(13.3 MG/ML) SUSPENSION FOR INFILTRATION
1.3 | Status: AC
Start: 2024-03-25 — End: 2024-03-25

## 2024-03-25 MED ORDER — DOCUSATE SODIUM 100 MG CAPSULE
100 | Freq: Two times a day (BID) | ORAL | Status: DC
Start: 2024-03-25 — End: 2024-03-27
  Administered 2024-03-26 – 2024-03-27 (×4): 100 mg via ORAL

## 2024-03-25 MED ORDER — ZOLPIDEM 5 MG TABLET
5 | Freq: Every evening | ORAL | Status: DC
Start: 2024-03-25 — End: 2024-03-27
  Administered 2024-03-27: 03:00:00 10 mg via ORAL

## 2024-03-25 MED ORDER — FENTANYL (PF) 50 MCG/ML INJECTION SOLUTION
50 | INTRAMUSCULAR | Status: AC
Start: 2024-03-25 — End: 2024-03-25

## 2024-03-25 MED ORDER — ONDANSETRON HCL (PF) 4 MG/2 ML INJECTION SOLUTION
4 | INTRAMUSCULAR | Status: DC | PRN
Start: 2024-03-25 — End: 2024-03-25
  Administered 2024-03-25: 17:00:00 4 via INTRAVENOUS

## 2024-03-25 MED ORDER — EPINEPHRINE HCL (PF) 1 MG/ML (1 ML) INJECTION SOLUTION
1 | INTRAMUSCULAR | Status: AC
Start: 2024-03-25 — End: 2024-03-25

## 2024-03-25 MED ORDER — OXYCODONE 5 MG TABLET
5 | ORAL | Status: AC
Start: 2024-03-25 — End: 2024-03-25

## 2024-03-25 MED ORDER — SODIUM CHLORIDE 0.9 % INTRAVENOUS PIGGYBACK
2 | Freq: Three times a day (TID) | INTRAVENOUS | Status: AC
Start: 2024-03-25 — End: 2024-03-26
  Administered 2024-03-26 (×3): 2 g via INTRAVENOUS

## 2024-03-25 MED ORDER — CEFAZOLIN 1 GRAM SOLUTION FOR INJECTION
1 | INTRAMUSCULAR | Status: DC | PRN
Start: 2024-03-25 — End: 2024-03-25
  Administered 2024-03-25: 17:00:00 2 via INTRAVENOUS

## 2024-03-25 MED ORDER — METOCLOPRAMIDE 5 MG/ML INJECTION SOLUTION
5 | Freq: Once | INTRAMUSCULAR | Status: DC | PRN
Start: 2024-03-25 — End: 2024-03-25

## 2024-03-25 MED ORDER — VENLAFAXINE ER 75 MG CAPSULE,EXTENDED RELEASE 24 HR
75 | Freq: Every day | ORAL | Status: DC
Start: 2024-03-25 — End: 2024-03-27
  Administered 2024-03-26 – 2024-03-27 (×2): 75 mg via ORAL

## 2024-03-25 MED ORDER — OXYCODONE ER 10 MG TABLET,CRUSH RESISTANT,EXTENDED RELEASE 12 HR
10 | ORAL | Status: AC
Start: 2024-03-25 — End: 2024-03-25

## 2024-03-25 MED ORDER — DEXMEDETOMIDINE 100 MCG/ML INTRAVENOUS SOLUTION
100 | INTRAVENOUS | Status: AC
Start: 2024-03-25 — End: 2024-03-25

## 2024-03-25 MED ORDER — HYDROMORPHONE 0.5 MG/0.5 ML INJECTION WRAPPER
0.5 | INTRAMUSCULAR | Status: DC | PRN
Start: 2024-03-25 — End: 2024-03-25
  Administered 2024-03-25 (×2): 0.2 mg via INTRAVENOUS

## 2024-03-25 MED ORDER — LACTATED RINGERS INTRAVENOUS SOLUTION
INTRAVENOUS | Status: DC | PRN
Start: 2024-03-25 — End: 2024-03-25
  Administered 2024-03-25: 16:00:00 via INTRAVENOUS

## 2024-03-25 MED ORDER — ONDANSETRON HCL (PF) 4 MG/2 ML INJECTION SOLUTION
4 | INTRAMUSCULAR | Status: AC
Start: 2024-03-25 — End: 2024-03-25

## 2024-03-25 MED ORDER — VANCOMYCIN 1,000 MG INTRAVENOUS INJECTION
1000 | INTRAVENOUS | Status: AC
Start: 2024-03-25 — End: 2024-03-25

## 2024-03-25 MED ORDER — CELECOXIB 200 MG CAPSULE
200 | ORAL | Status: AC
Start: 2024-03-25 — End: 2024-03-25

## 2024-03-25 MED ORDER — MIDAZOLAM 1 MG/ML INJECTION SOLUTION WRAPPER
1 | INTRAMUSCULAR | Status: DC | PRN
Start: 2024-03-25 — End: 2024-03-25
  Administered 2024-03-25: 16:00:00 1 via INTRAVENOUS
  Administered 2024-03-25: 15:00:00 2 via INTRAVENOUS
  Administered 2024-03-25: 16:00:00 1 via INTRAVENOUS

## 2024-03-25 MED ORDER — OXYCODONE 5 MG/5 ML ORAL SOLUTION
5 | ORAL | Status: DC | PRN
Start: 2024-03-25 — End: 2024-03-27

## 2024-03-25 MED ORDER — LACTATED RINGERS IRRIGATION SOLUTION
Status: DC | PRN
Start: 2024-03-25 — End: 2024-03-25
  Administered 2024-03-25: 17:00:00 3000

## 2024-03-25 MED ORDER — LIDOCAINE (PF) 20 MG/ML (2 %) INJECTION WRAPPER
20 | INTRAMUSCULAR | Status: AC
Start: 2024-03-25 — End: 2024-03-25

## 2024-03-25 MED ORDER — NALOXONE 0.4 MG/ML INJECTION SOLUTION
0.4 | INTRAMUSCULAR | Status: DC | PRN
Start: 2024-03-25 — End: 2024-03-27

## 2024-03-25 MED ORDER — CELECOXIB 200 MG CAPSULE
200 | Freq: Once | ORAL | Status: AC
Start: 2024-03-25 — End: 2024-03-25
  Administered 2024-03-25: 14:00:00 400 mg via ORAL

## 2024-03-25 MED ORDER — OXYCODONE 5 MG/5 ML ORAL SOLUTION
5 | Freq: Three times a day (TID) | ORAL | Status: DC | PRN
Start: 2024-03-25 — End: 2024-03-27

## 2024-03-25 MED ORDER — CELECOXIB 200 MG CAPSULE
200 | Freq: Two times a day (BID) | ORAL | Status: DC
Start: 2024-03-25 — End: 2024-03-27
  Administered 2024-03-26 – 2024-03-27 (×4): 200 mg via ORAL

## 2024-03-25 MED ORDER — TRANEXAMIC ACID 1,000 MG/10 ML (100 MG/ML) INTRAVENOUS SOLUTION
1000 | INTRAVENOUS | Status: DC | PRN
Start: 2024-03-25 — End: 2024-03-25
  Administered 2024-03-25: 16:00:00 1000 via INTRAVENOUS

## 2024-03-25 MED ORDER — GENTAMICIN 40 MG/ML INJECTION SOLUTION
40 | INTRAMUSCULAR | Status: DC | PRN
Start: 2024-03-25 — End: 2024-03-25
  Administered 2024-03-25: 16:00:00 80 via INTRAVENOUS

## 2024-03-25 MED ORDER — HYDROMORPHONE 1 MG/ML INJECTION WRAPPER
1 | INTRAMUSCULAR | Status: DC | PRN
Start: 2024-03-25 — End: 2024-03-25
  Administered 2024-03-25: 17:00:00 1 via INTRAVENOUS

## 2024-03-25 MED ORDER — OXYCODONE ER 10 MG TABLET,CRUSH RESISTANT,EXTENDED RELEASE 12 HR
10 | Freq: Once | ORAL | Status: AC
Start: 2024-03-25 — End: 2024-03-25
  Administered 2024-03-25: 14:00:00 10 mg via ORAL

## 2024-03-25 MED ORDER — ACETAMINOPHEN 325 MG TABLET
325 | Freq: Once | ORAL | Status: AC
Start: 2024-03-25 — End: 2024-03-25
  Administered 2024-03-25: 14:00:00 975 mg via ORAL

## 2024-03-25 MED FILL — PROPOFOL 10 MG/ML INTRAVENOUS EMULSION: 10 10 mg/mL | INTRAVENOUS | Qty: 50 | Fill #0

## 2024-03-25 MED FILL — MIDAZOLAM 1 MG/ML INJECTION SOLUTION WRAPPER: 1 1 mg/mL | INTRAMUSCULAR | Qty: 2 | Fill #0

## 2024-03-25 MED FILL — DEXMEDETOMIDINE 100 MCG/ML INTRAVENOUS SOLUTION: 100 100 mcg/mL | INTRAVENOUS | Qty: 2 | Fill #0

## 2024-03-25 MED FILL — GENTAMICIN 80 MG/100 ML IN SODIUM CHLORIDE(ISO) INTRAVENOUS PIGGYBACK: 80 80 mg/100 mL | INTRAVENOUS | Qty: 100 | Fill #0

## 2024-03-25 MED FILL — VANCOMYCIN 1,000 MG INTRAVENOUS INJECTION: 1000 1,000 mg | INTRAVENOUS | Qty: 2 | Fill #0

## 2024-03-25 MED FILL — PROPOFOL 10 MG/ML INTRAVENOUS EMULSION: 10 10 mg/mL | INTRAVENOUS | Qty: 20 | Fill #0

## 2024-03-25 MED FILL — BUPIVACAINE (PF) 0.25 % (2.5 MG/ML) INJECTION SOLUTION: 0.25 0.25 % (2.5 mg/mL) | INTRAMUSCULAR | Qty: 30 | Fill #0

## 2024-03-25 MED FILL — ROPIVACAINE (PF) 5 MG/ML (0.5 %) INJECTION SOLUTION: 5 5 mg/mL (0.5 %) | INTRAMUSCULAR | Qty: 30 | Fill #0

## 2024-03-25 MED FILL — OXYCODONE ER 10 MG TABLET,CRUSH RESISTANT,EXTENDED RELEASE 12 HR: 10 10 mg | ORAL | Qty: 1 | Fill #0

## 2024-03-25 MED FILL — ONDANSETRON HCL (PF) 4 MG/2 ML INJECTION SOLUTION: 4 4 mg/2 mL | INTRAMUSCULAR | Qty: 2 | Fill #0

## 2024-03-25 MED FILL — LURASIDONE 20 MG TABLET: 20 20 mg | ORAL | Qty: 4 | Fill #0

## 2024-03-25 MED FILL — DEXAMETHASONE SODIUM PHOSPHATE 4 MG/ML INJECTION SOLUTION: 4 4 mg/mL | INTRAMUSCULAR | Qty: 1 | Fill #0

## 2024-03-25 MED FILL — APIXABAN 2.5 MG TABLET: 2.5 2.5 mg | ORAL | Qty: 1 | Fill #0

## 2024-03-25 MED FILL — CELECOXIB 200 MG CAPSULE: 200 200 mg | ORAL | Qty: 2 | Fill #0

## 2024-03-25 MED FILL — PHENYLEPHRINE 10 MG/ML INJECTION SOLUTION: 10 10 mg/mL | INTRAMUSCULAR | Qty: 1 | Fill #0

## 2024-03-25 MED FILL — FENTANYL (PF) 50 MCG/ML INJECTION SOLUTION: 50 50 mcg/mL | INTRAMUSCULAR | Qty: 2 | Fill #0

## 2024-03-25 MED FILL — HYDROMORPHONE 1 MG/ML INJECTION WRAPPER: 1 1 mg/mL | INTRAMUSCULAR | Qty: 1 | Fill #0

## 2024-03-25 MED FILL — HYDROXYZINE HCL 25 MG TABLET: 25 25 mg | ORAL | Qty: 1 | Fill #0

## 2024-03-25 MED FILL — TRANEXAMIC ACID 1,000 MG/10 ML (100 MG/ML) INTRAVENOUS SOLUTION: 1000 1,000 mg/10 mL (100 mg/mL) | INTRAVENOUS | Qty: 10 | Fill #0

## 2024-03-25 MED FILL — VENLAFAXINE ER 75 MG CAPSULE,EXTENDED RELEASE 24 HR: 75 75 mg | ORAL | Qty: 1 | Fill #0

## 2024-03-25 MED FILL — LIDOCAINE (PF) 20 MG/ML (2 %) INJECTION WRAPPER: 20 20 mg/mL (2 %) | INTRAMUSCULAR | Qty: 5 | Fill #0

## 2024-03-25 MED FILL — EPINEPHRINE HCL (PF) 1 MG/ML (1 ML) INJECTION SOLUTION: 1 1 mg/mL (1 mL) | INTRAMUSCULAR | Qty: 1 | Fill #0

## 2024-03-25 MED FILL — BUPIVACAINE LIPOSOME(PF) 1.3 %(13.3 MG/ML) SUSPENSION FOR INFILTRATION: 1.3 1.3 % (13.3 mg/mL) | Qty: 10 | Fill #0

## 2024-03-25 MED FILL — OXYCODONE 5 MG TABLET: 5 5 mg | ORAL | Qty: 2 | Fill #0

## 2024-03-25 MED FILL — BACLOFEN 10 MG TABLET: 10 10 mg | ORAL | Qty: 1 | Fill #0

## 2024-03-25 MED FILL — ACETAMINOPHEN 325 MG TABLET: 325 325 mg | ORAL | Qty: 3 | Fill #0

## 2024-03-25 NOTE — Interval H&P Note (Signed)
 H&P reviewed. The patient was examined and there are no changes to the H&P.

## 2024-03-25 NOTE — Progress Notes (Signed)
 [facility] REFERRAL FORM  Demographics       Address   [address] [place] [address]    Phone Numbers   Cell: [phone]    Marital Status   Single    Insurance Information   [employer] MEDICARE REPLACEMENT    Religion   Christian             Allergies (Reviewed on: 03/25/24)       Agent Severity Comments    Doxycycline High     Lithium High Exacerbates eczema and psoriasis       Risperidone High I'm not in my right mind, causes pacing, anxiety, insomnia    Shellfish Containing Products High     Strawberry High Tingling In Throat    Wellbutrin [Bupropion Hcl] High Senses overwhelmed          Health Care Agents    There are no Health Care Agents on file.       Preferred Language       Preferred Language  English Preferred Written Language  English             Medication List        ASK your doctor about these medications      acetaminophen  500 mg capsule  Take 1,000 mg by mouth every 6 (six) hours if needed.  Last time this was given: Ask your nurse or doctor     Ambien  CR 12.5 mg ER tablet  Take 12.5 mg by mouth at bedtime.  Generic drug: zolpidem  CR     ascorbic acid 1,000 mg tablet  Commonly known as: Vitamin C  Take 1,000 mg by mouth in the morning.     baclofen  10 mg tablet  Commonly known as: Lioresal   Take 1 tablet (10 mg) by mouth twice daily.     betamethasone dipropionate 0.05 % ointment  Commonly known as: Diprosone  Apply topically if needed each day.     bisacodyl  5 mg EC tablet  Commonly known as: Dulcolax  Take 5 mg by mouth twice daily. Do not crush, chew, or split.     BRIUMVI  IV  Infuse into a venous catheter every 6 (six) months.     cholecalciferol  (vitamin D3) 50 mcg (2,000 unit) capsule  Commonly known as: Vitamin D3  Take 1 capsule (50 mcg) by mouth once daily.     clindamycin 1 % lotion  Commonly known as: Cleocin T  Apply topically twice daily.     clobetasol 0.05 % ointment  Commonly known as: Temovate  Apply topically if needed in the morning and at  bedtime.     diclofenac 75 mg EC tablet  Commonly known as: Voltaren  Take 75 mg by mouth if needed in the morning and at bedtime.     ferrous gluconate 324 (37.5 Fe) MG tablet  Commonly known as: Fergon  Take 1 tablet by mouth three times daily.     FLONASE NASL  Administer 2 sprays into affected nostril(s) in the morning.     folic acid 1 mg tablet  Commonly known as: Folvite  Take 1 mg by mouth three times daily.     gabapentin  100 mg capsule  Commonly known as: Neurontin   Take 1 capsule (100 mg) by mouth twice daily AND 3 capsules (300 mg) at bedtime.     hydrOXYzine  HCL 25 mg tablet  Commonly known as: Atarax   Take 25 mg by mouth if needed for itching.  hydrOXYzine  pamoate 25 mg capsule  Commonly known as: Vistaril   Take 25 mg by mouth.     ketoconazole 2 % cream  Commonly known as: NIZOral  Apply topically once daily.     lamoTRIgine  100 mg tablet  Commonly known as: LaMICtal   Take 0.5 tablets by mouth once daily. Take with food, with big meal at lunch; patient eats 1 full meal     lurasidone  80 mg tablet  Commonly known as: Latuda   Take 80 mg by mouth in the evening.     multivit-min-FA-lycopen-lutein 0.4 mg-300 mcg- 250 mcg tablet  Commonly known as: Centrum Silver  Take 1 tablet by mouth once daily.     thiamine  50 mg tablet  Commonly known as: Vitamin B-1  Take 1 tablet (50 mg) by mouth once daily.     traZODone  50 mg tablet  Commonly known as: Desyrel   Take 25-50 mg by mouth at bedtime.     * venlafaxine  XR 75 mg 24 hr capsule  Commonly known as: Effexor -XR  Take 75 mg by mouth in the morning. Do not crush or chew.     * venlafaxine  XR 150 mg 24 hr capsule  Commonly known as: Effexor -XR  Take 1 capsule by mouth in the morning.           * This list has 2 medication(s) that are the same as other medications prescribed for you. Read the directions carefully, and ask your doctor or other care provider to review them with you.                        Discharge Referral Orders (From admission, onward)        Start     Ordered    03/25/24 0000  Ambulatory referral to Home Health/VNA        Comments: Special Instructions:       I attest that I or another qualified licensed provider saw AYELET GRUENEWALD 90 days prior to or 30 days post admission and this face to face encounter meets the necessary Home Health requirements. The face to face encounter occurred on (date) 03/25/2024  .    The encounter with the patient was in whole, or in part, for the following medical condition, which is the primary reason for home health care. (List medical condition) Unilateral primary osteoarthritis, right knee  (primary encounter diagnosis)        I certify that, based on my findings, the following services are medically necessary skilled home health services: Evaluate and Treat and Strengthening Exercises.    Further, I certify that my clinical findings support this patient's homebound status (i.e. absences from home require considerable and taxing effort, are for health treatment, or for attendance at religious events; absences from home for nonmedical reasons are infrequent or are of relatively short duration).    The clinical findings that support the need for home care and homebound status are due to needs special transportation and the patient has a condition such that leaving his/her home is medically contraindicated.  There exists a normal inability to leave home and leaving home requires a considerable and taxing effort including worsening clinical course   Authorizing Provider: Jayson JONETTA Police, MD      Question Answer Comment   Disciplines Requested Physical Therapy    Is patient a part of Maternal Child Health program? No        03/25/24 1345  Nursing Post-Acute/Referral Information (Page 2)      Flowsheet Row Most Recent Value   Height and Weight    Height 1.651 m   Height Method Stated   Weight 108.9 kg   Weight Method Stated     Patient Lines/Drains/Airways Status       Active Lines, Drains, Airways     Peripheral IV 03/25/24 Left;Posterior Hand     Properties       Placement date 03/25/24  03/25/24 0952 Site Hand  03/25/24 0952    Placement time 0945  03/25/24 0952 Days less than 1    Hand Hygiene Completed: Yes  03/25/24 0952 Size (Gauge): 20 G  03/25/24 0952    Orientation: Left;Posterior  03/25/24 0952 Site Prep: Chlorhexidine  03/25/24 0952    Local Anesthetic: None  03/25/24 0952 Technique: Anatomical landmarks  03/25/24 0952    Placed by: Penne Clonts RN  03/25/24 0952 Insertion attempts: 1  03/25/24 0952    Patient Tolerance: Tolerated well  03/25/24 0952 Difficult Venous Access: No  03/25/24 0952    Unsuccessful Attempt Locations: Left hand  03/25/24 0952                Assessments            Wound 03/25/24 Surgical Closed Surgical Incision Knee Anterior;Right     Properties       Date First Assessed 03/25/24  03/25/24 1231 Site Knee  03/25/24 1231    Time First Assessed 1231  03/25/24 1231 Days less than 1    Primary Wound Type: Surgical  03/25/24 1231 Secondary Wound Type - Surgical: Closed Surgi  03/25/24 1231    Wound Location Orientation: Anterior;Right  03/25/24 1231                Assessments       Row Name 03/25/24 1321 03/25/24 1231    Dressing --  Steri's, PICO, ACE, TED, Immobilizer  03/25/24 1323 --  Steri's, PICO, Ace, TED  03/25/24 1232                          After Discharge Information       Visiting Nurse Agency: [facility], an [employer] [phone])    Visiting Nurse Agency Address: [address] [place], [place] [address]    Visiting Nurse Agency Phone #: [phone]            Discharge Summary    No notes of this type exist for this encounter.

## 2024-03-25 NOTE — Anesthesia Postprocedure Evaluation (Signed)
 Patient: Sandra Villegas    Procedure Summary       Date: 03/25/24 Room / Location: [facility] OR 11 / [facility] Operating Room    Anesthesia Start: 1107 Anesthesia Stop: 1404    Procedure: Arthroplasty, Knee, Total (Right: Knee) Diagnosis:       Unilateral primary osteoarthritis, right knee      (Unilateral primary osteoarthritis, right knee [M17.11])    Surgeons: Jayson JONETTA Police, MD Responsible Provider: Raynold Pearl, MD    Anesthesia Type: MAC, spinal ASA Status: 3            Anesthesia Type: MAC, spinal    Vitals Value Taken Time   BP 122/99 03/25/24 15:02   Temp 36.1 C (97 F) 03/25/24 14:00   Pulse 78 03/25/24 15:36   Resp 13 03/25/24 15:36   SpO2 99 % 03/25/24 15:36   Vitals shown include unfiled device data.    Anesthesia Post Evaluation Note:    Patient location during evaluation: PACU  Patient participation: able to participate  Level of consciousness: arousable  Cardiovascular and Hydration status: stable  Respiratory Status Stable and Airway Patent: yes  Nausea and Vomiting Control Satisfactory: yes  Pain management: adequate     Aldrete score reviewed: yes  Vitals reviewed: yes  Unplanned ICU Admission: noPatient has recovered from anesthesia and has returned to baseline mental status, cardiovascular and respiratory function. Pain, nausea, and vomiting are adequately controlled and the patient is adequately hydrated and appropriate for discharge from PACU?: yes      There were no known notable events for this encounter.

## 2024-03-25 NOTE — Op Note (Signed)
 Arthroplasty, Knee, Total (R) Operative Note     Date: 03/25/2024  Location: [facility] OR    Name: Sandra Villegas, DOB: 10/01/1960, MRN: [id number]    Diagnosis  Pre-op Diagnosis      * Unilateral primary osteoarthritis, right knee [M17.11] Post-op Diagnosis     * Unilateral primary osteoarthritis, right knee [M17.11]     Procedures  Arthroplasty, Knee, Total  72552 - PR TOTAL KNEE ARTHROPLASTY      Surgeons      * Jayson JONETTA Police - Primary    Procedure Summary  Anesthesia: Spinal  ASA: III  Estimated Blood Loss: 150 mL    Drains: no  Implants       Type Name Action Serial No.      Bone Cement CEMENT REGULAR 6191-1-001 - [id number] Implanted      Orthopedic Implant PATELLA RESURF GMK SPHERE 02.12.E003RP - [id number] Implanted      Orthopedic Implant IMPLANT FEMORAL COMP S4R CMN 02.12.KA04R - [id number] Implanted      Orthopedic Implant COMPONENT GMK TIBIAL CMNT 02.12.T3I4R - [id number] Implanted      Orthopedic Implant INSERT TIBIAL GMK E-CROSS FLX 97.87.Z9589QM - [id number] Implanted            Staff:   Circulator: Monica Mediate, RN  Relief Circulator: Devere Area, RN  Relief Scrub: Avel Booze  Scrub Person: Renda Nap  First Assist: Cordella Coria, PA; Rankin Rase, PA-C    Indications: [name] is an 63 y.o. female who is having surgery for Unilateral primary osteoarthritis, right knee [M17.11].             Jayson JONETTA Police  Phone Number: [phone]

## 2024-03-25 NOTE — Op Note (Signed)
 Operative Report   Patient Name:     Sandra Villegas      Date of Service:     March 25, 2024        Patient ID:     [id number]     Date of Birth:     Jan 22, 1961        Clinician:     Jayson Police, MD, FACS       Referring Clinician:     GUSTAV COUNCILMAN, M.D.                       PREOPERATIVE DIAGNOSIS:  Right knee arthritis.     POSTOPERATIVE DIAGNOSIS:  Right knee arthritis.     PROCEDURE:  Right total knee replacement.     SURGEON:  Jayson Police, M.D.     ASSISTANTS:  1.  Cordella Coria, P.A.-C.  2.  Rankin HERO. Reed, P.A.-C.     ANESTHESIA:  Spinal with adductor block.     INDICATIONS:  The patient is a 63 year old woman with severe knee arthritis unrelieved by conservative management and now enters for treatment.     DESCRIPTION OF PROCEDURE:  The patient was placed in the supine position.  The patient was administered Kefzol , gentamicin , tranexamic acid , adductor block, and spinal anesthetic.  She was prepped and draped in the usual sterile fashion.  An Esmarch bandage was used to exsanguinate the limb and the tourniquet was raised to 275 mmHg.  A longitudinal incision was made.  Because of bleeding, the tourniquet was released.  The knee was flexed.  Good hemostasis was achieved with Bovie electrocautery.  An Esmarch bandage again was used and the tourniquet was raised to 300 mmHg.  The incision was completed with a median parapatellar approach.  The patient was noted to have severe tricompartmental arthritis with eburnated bone at the medial femoral condyle and unworn laterally, but there was wear both at the medial and the lateral femoral condyle posteriorly.  Using the Medacta GMK SpheriKA total knee replacement in a kinematic fashion, a drill hole was made for the intramedullary femoral guide.  A worn medially, unworn laterally, guide was used.  A distal femoral facing cut was made measuring 6 mm medially and 8 mm laterally.  The patient sized for a size 4 right femur.  The pins were placed  and this was placed in 3 degrees of external rotation and the 4 block was used with threaded-headed pins and screws.  Anterior cut was made.  Anterior chamfer was made.  Posterior condyle cuts were made and measured 5 mm medially and 3 mm laterally, having used a 3-degree external rotation jig with a worn medially and worn laterally guide.  The pins were then removed and posterior chamfers were done.  The block was removed and the size 4 right femur fit nicely and the femur was removed.  A bone block was placed in front of the PCL.  A tibial guide was placed in proper rotation and proper slope measuring off the tibial spines and Bovie measuring the medial and lateral marks and then with the guide in the proper rotation and proper slope with a slope of around 4 degrees, the pins were placed and a tibial cut was made preserving the posterior and collateral structures and the tibial wafer measured 9 mm medially and laterally.  The patient was sized for a t3-i4 tibia and laminar spreader was placed.  Osteophytes were removed off the posterior femoral  condyle.  Injection of Duramorph  and Sensorcaine  after aspiration with a 20-gauge needle.  Injection into the fascia and the subcutaneous tissue was performed.  A 10 block fit nicely in extension.  In flexion, there was slight tightness.  I thought that we might need to do a release of the PCL as a result.  With the t3-i4 right tibia in place, reaming was performed and impaction.  A 10 poly was used and a 4 right femur and with this, there was some slight tightness in flexion.  For that reason, recession of the PCL was performed and with this, the patient had excellent flexion to greater than 130 degrees in full extension and a stable knee.  Patella thickness was measured and 9 mm was cut off the patella leaving a residual of 12 mm.  A drill hole was made in the proper rotation.  Size 3 patellar button had excellent patellar tracking using the rule of no thumb.  Trial  components were removed.  Pulse lavage was performed.  Surfaces were dried.  Two packs of Simplex cement were mixed with 1 gram of vancomycin .  The size t3-i4 right tibia was cemented in place and the 10 poly was placed and the size 4 right femur was cemented in place.  Leg lift was performed.  Cement was pressurized into the lug holes and a size 3 patella button was cemented in place and held in place with a clamp.  When the cement had hardened, excess cement was removed.  The tourniquet was released.  Good hemostasis was achieved with Bovie electrocautery.  Pulse lavage and Betadine irrigation was performed.  The vancomycin  powder was placed and the fascia was closed in two layers with #1 Vicryl in interrupted figure-of-eight fashion.  The subcutaneous tissue was closed using 0, 2-0 and 3-0 Monocryl after placing the vancomycin  powder.  Skin was closed with subcuticular 4-0 Monocryl.  Steri-Strips were applied.  A PICO dressing was applied.  He was placed in a knee immobilizer and taken to the PACU.     Cordella Coria, P.A.-C. was the principal assistant and was essential to performing the surgery.                                                                                Jayson Police, MD, FACS           Date Dictated:     03/25/2024 14:01:32        Date Transcribed:     03/25/2024 14:29:00        SG/wmt   Job #: [id number]            cc:     GUSTAV COUNCILMAN, M.D.

## 2024-03-25 NOTE — Anesthesia Procedure Notes (Signed)
 Peripheral Block    Date/Time: 03/25/2024 10:29 AM    Patient Location:  Holding area  Reason for Block: post-op pain management    Staff:     Anesthesiologist:  Raynold Pearl, MD    Performed by:  Anesthesiologist  patient identified, anesthesia consent, monitors and equipment checked, pre-op evaluation and timeout performed    Peripheral Nerve Block:     Patient Position:  Semi-sitting    Prep: ChloraPrep      Monitoring:  Continuous pulse ox  Common: Adductor Canal    Lower Extremity: Adductor Canal    Laterality:  Right  Injection Technique:  Single-shot  Needle:     Needle Localization:  Ultrasound guidance    Image is saved in permenant record: No    Medications:     1% lidocaine  used to infiltrate the skin and subcutaneous tissue       ropivacaine  (Naropin ) 5 mg/mL (0.5 %) injection - perineural injection   25 mL - 03/25/2024 10:30:00 AM  dexAMETHasone  (Decadron ) injection - perineural injection   4 mg - 03/25/2024 10:30:00 AM  Assessment:     Injection Assessment:  Negative aspiration    Paresthesia Pain:  None

## 2024-03-25 NOTE — Anesthesia Procedure Notes (Addendum)
 Neuraxial Block     Final Procedure: Spinal  Planned Procedure: Spinal  Patient location during procedure: OR  Reason for block: primary anesthetic  Start time: 03/25/2024 11:15 AM  End time: 03/25/2024 11:24 AM    Staffing  Performed: resident/CRNA   Authorized by: Raynold Pearl, MD    Performed by: Dorrene Dolphin, CRNA      Patient position: sitting  Prep: Betadine  Sterility prep: cap, drape, gloves, hand hygiene and mask  Sedation level: light sedation  Patient monitoring: blood pressure, continuous pulse oximetry and ETCO2  Approach: midline  Guidance: landmark technique      Needle type: Pencan   Needle gauge: 25 G  Needle length: 8.9 cm  Injection technique: single shot  Lumbar location: L3-4    Medications Administered:  bupivacaine -dextrose -water (PF) (Marcaine  Spinal) 0.75 % (7.5 mg/mL) injection - intrathecal   1.4 mL - 03/25/2024 11:23:00 AM    1% lidocaine  used to infiltrate the skin and subcutaneous tissue  blood pressure, continuous pulse oximetry and ETCO2  Assessment:     Procedure Assessment: patient tolerated procedure well with no complications      Assessment  Left Sensory Level: T9  Right Sensory Level: T9  Block outcome: pain improved  Number of attempts: 1  Procedure assessment: patient tolerated procedure well with no immediate complications  Staff:     Performed by:  Resident/CRNA    Anesthesiologist:  Raynold Pearl, MD    Resident/CRNA:  Dorrene Dolphin, CRNA

## 2024-03-25 NOTE — H&P (Signed)
 Preoperative History and Physical   Patient Name:     Sandra Villegas, Sandra Villegas      Date of Service:     March 25, 2024        Patient ID:     [id number]     Date of Birth:     Apr 13, 1961        Clinician:     Jayson Police, MD, FACS           CHIEF COMPLAINT:  This 63 year old woman enters for a right total knee replacement.     HISTORY OF PRESENT ILLNESS:  This 63 year old woman presents with increasing pain in her right knee despite conservative treatment.  The patient has had increasing pain and difficulty with ambulation.  She can only walk a short distance before she has to stop.  She has a genu valgum deformity and x-rays with severe lateral compartment arthritis.  She has had extensive conservative treatment without improvement.  Risks and benefits of a right total knee replacement were discussed at length.  The patient understands this and consents for surgery.     PAST MEDICAL HISTORY:  1.  Multiple sclerosis.  2.  PTSD.  3.  BMI of 36.     ALLERGIES:  Erythromycin, lithium, shellfish, strawberries and Wellbutrin.     MEDICATIONS:  1.  Baclofen .  2.  Vitamin D.  3.  [place]vigo.  4.  Hydroxyzine.  5.  Lamotrigine.  6.  Multivitamin.  7.  Thiamine .  8.  Zolpidem.  9.  Briumvi .  10.  Gabapentin .     FAMILY HISTORY:  Noncontributory.     SOCIAL HISTORY:  Unremarkable.     REVIEW OF SYSTEMS:  Negative in detail.     PHYSICAL EXAMINATION:  SKIN:  No rashes or ulcers.  HEENT:  Benign.  NECK:  Supple.  CHEST:  Clear.     HEART:  Cardiac examination within normal limits.  ABDOMEN:  Benign.  EXTREMITIES:  With genu valgus deformity.  Neurologic intact.     ASSESSMENT:  Right knee arthritis.     PLAN:  Right total knee replacement.  Risks and benefits of surgery were discussed at length with the patient.  The patient understands this and consents to surgery.                                                                                Jayson Police, MD, FACS           Date Dictated:     03/17/2024 12:29:23        Date  Transcribed:     03/17/2024 13:19:00        SG/wmt   Job #: [id number]

## 2024-03-25 NOTE — Nursing Note (Signed)
 Patient arrived from the PACU @18 :30. Axox4 pleasant. Educated room call light.  OOB to commode stand pivot 1 assist. Lungs clear no cough.Reviewed meds and verified with pharmacy. See admission assessment. Pain is 4/10 feeling sleep had good effect from 10mg  po oxy in PACU. Ate well. Ice to knee immobilized with brace.  Patient keeps asking to undo straps. Reviewed the importance of when OOB needs it on. Patient asking to sleep. Bed in low position call light in reach light dimmed. Will continue to monitor.

## 2024-03-26 LAB — CBC WITH DIFFERENTIAL
Basophils %: 0.1 %
Basophils Absolute: 0.01 K/uL (ref 0.00–0.22)
Eosinophils %: 0 %
Eosinophils Absolute: 0 K/uL (ref 0.00–0.50)
Hematocrit: 29.4 % — ABNORMAL LOW (ref 32.0–47.0)
Hemoglobin: 9.8 g/dL — ABNORMAL LOW (ref 11.0–16.0)
Immature Granulocytes %: 0.5 %
Immature Granulocytes Absolute: 0.05 K/uL (ref 0.00–0.10)
Lymphocyte %: 7.5 %
Lymphocytes Absolute: 0.71 K/uL (ref 0.70–4.00)
MCH: 27.5 pg (ref 26.0–34.0)
MCHC: 33.3 g/dL (ref 31.0–37.0)
MCV: 82.6 fL (ref 80.0–100.0)
MPV: 9.3 fL (ref 9.1–12.4)
Monocytes %: 14.2 %
Monocytes Absolute: 1.35 K/uL — ABNORMAL HIGH (ref 0.36–0.77)
NRBC %: 0 % (ref 0.0–0.0)
NRBC Absolute: 0 K/uL (ref 0.00–2.00)
Neutrophil %: 77.7 %
Neutrophils Absolute: 7.41 K/uL (ref 1.50–7.95)
Platelets: 222 K/uL (ref 150–400)
RBC: 3.56 M/uL — ABNORMAL LOW (ref 3.70–5.20)
RDW-CV: 13.7 % (ref 11.5–14.5)
RDW-SD: 41.1 fL (ref 35.0–51.0)
WBC: 9.5 K/uL (ref 4.0–11.0)

## 2024-03-26 LAB — PROTIME-INR
INR: 1.01
INR: 1.03
Protime: 10.8 s (ref 9.3–11.6)
Protime: 11 s (ref 9.3–11.6)

## 2024-03-26 LAB — RAINBOW DRAW LT. GREEN PST TOP

## 2024-03-26 MED ORDER — TRAZODONE 100 MG TABLET
100 | Freq: Every evening | ORAL | Status: DC
Start: 2024-03-26 — End: 2024-03-27
  Administered 2024-03-26 – 2024-03-27 (×2): 200 mg via ORAL

## 2024-03-26 MED FILL — LURASIDONE 20 MG TABLET: 20 20 mg | ORAL | Qty: 4 | Fill #0

## 2024-03-26 MED FILL — OXYCODONE 5 MG TABLET: 5 5 mg | ORAL | Qty: 2 | Fill #0

## 2024-03-26 MED FILL — GABAPENTIN 300 MG CAPSULE: 300 300 mg | ORAL | Qty: 1 | Fill #0

## 2024-03-26 MED FILL — LAMOTRIGINE 25 MG TABLET: 25 25 mg | ORAL | Qty: 2 | Fill #0

## 2024-03-26 MED FILL — CEFAZOLIN IVPB 2 G IN 100 ML NS (MINI-BAG PLUS): 2.0000 2.0000 g | Qty: 2000 | Fill #0

## 2024-03-26 MED FILL — ACETAMINOPHEN 325 MG TABLET: 325 325 mg | ORAL | Qty: 3 | Fill #0

## 2024-03-26 MED FILL — BACLOFEN 10 MG TABLET: 10 10 mg | ORAL | Qty: 1 | Fill #0

## 2024-03-26 MED FILL — DOCUSATE SODIUM 100 MG CAPSULE: 100 100 mg | ORAL | Qty: 1 | Fill #0

## 2024-03-26 MED FILL — APIXABAN 2.5 MG TABLET: 2.5 2.5 mg | ORAL | Qty: 1 | Fill #0

## 2024-03-26 MED FILL — ZOLPIDEM 5 MG TABLET: 5 5 mg | ORAL | Qty: 1 | Fill #0

## 2024-03-26 MED FILL — OXYCODONE 5 MG TABLET: 5 5 mg | ORAL | Qty: 1 | Fill #0

## 2024-03-26 MED FILL — CELECOXIB 200 MG CAPSULE: 200 200 mg | ORAL | Qty: 1 | Fill #0

## 2024-03-26 MED FILL — HYDROXYZINE HCL 25 MG TABLET: 25 25 mg | ORAL | Qty: 1 | Fill #0

## 2024-03-26 MED FILL — TRAZODONE 100 MG TABLET: 100 100 mg | ORAL | Qty: 2 | Fill #0

## 2024-03-26 MED FILL — VENLAFAXINE ER 75 MG CAPSULE,EXTENDED RELEASE 24 HR: 75 75 mg | ORAL | Qty: 1 | Fill #0

## 2024-03-26 MED FILL — TRAZODONE 50 MG TABLET: 50 50 mg | ORAL | Qty: 1 | Fill #0

## 2024-03-26 NOTE — Progress Notes (Addendum)
 03/26/24 1048   Case Management Initial Assessment   Source of Information Patient   Type of Residence Single level Home   Lives With Alone   Patient Information   Accompanied by Family   Current Patient Responsibilities Housekeeping;Personal Care;Shopping;Meal Prep   Current Functional Status   Bathing Minimal assist   Toileting Minimal Assist   Walking in Home Minimal Assist   Community Mobility Minimal Assist   Financial Information   Insurance Confirmed with Patient Yes   Discharge Planning   Patient/Family/Caregiver Discharge Preference Home w/ Services   Anticipated Discharge Disposition Home w/ VNA   Next Actions   Assessment/High Risk Screening Complete       Procedure: Arthroplasty, Knee, Total (Right: Knee)      Pt lives indep at home alone. Single level home. Will need RW. SABRA  Home services has been recommended. Patient/family given list of VNA agency choices. Patient/family agreeable for referral to be placed per patient preference of agency. Made pt aware of [facility] affiliation with [facility]. Patient/family agreeable to plan and agreebale to any agency that can accommodate patients needs.        Per SW, pt requsting MoW, pt states she has [employer], Has homemaker who clenas a  Corean Hailey RN, Case Manager 03/26/2024 10:49 AM       12/10 PT has recommended SNF. Patient/family given list of choices of providers. Patient/family agreeable to referrals of choice.  Agreeable to [facility]on and [facility].  Corean Hailey RN, Case Manager 03/26/2024 2:49 PM

## 2024-03-26 NOTE — Nursing Note (Signed)
 1500-1900: Assumed care at this time. Patient resting in bed with eyes open. RLE surgical site with PICO dressing in place - blinking green. There is small amount of old drainage stain to dressing. Ice placed to surgical site. Knee immobilizer noted. Repositioned. IS education provided - tolerated well. CMS intact. Compression device and stockings on. 6/10 pain - medicated appropriately. Please see MAR. +BS - LBM 12/9.   All questions encouraged and answered  Call light within reach  Bed in lowest position.

## 2024-03-26 NOTE — Initial Assessments (Signed)
 Initial   SW Assessment    Name: Sandra Villegas  MRN: [id number]        Referral Information  Referral Name: Tereso Hilt, RN  Referral Reason: Homelessness, Other (food support when recovering from surgery)  Date Referral Received: 03/26/24  Source of Information: Conversation with patient, Medical record review  Visit Status: Inpatient/Admitted  Admitted from: ED      Patient Information  Primary Caregiver: Self  Support System: Family  Primary Support Contact Name: Abuk Selleck (son)  Primary Support Contact Number: [phone]  Is the Patient fully Communicative?: Yes      Interpreter Services  Is an interpreter used? : N      Healthcare Proxy  Medical record reviewed for Healthcare Proxy?: Yes  Healthcare Proxy located in the MEDICAL RECORD [id number]  Healthcare Proxy Name: Rily Nickey  Healthcare Proxy Phone Number: : [phone]  Healthcare Proxy's relationship to patient: Son  Does Patient Have a Legal Guardian?: No      Living Situation/Support System   Current Living Situation: Room, apartment or house that you rent  Lives with: alone  Support System/Community Providers with Contact Information: family, PCP Gustav Councilman, MD [phone]      Employment/Financial Status   Employment Status: retired  Catering Manager of Income: SSI 2000 amonth and SNAP 24 a month.  Insurance on file verified with patient?: Yes  Prescription Coverage: Yes  Need for Financial Assistance: No  Does the patient have Medicare only?: No  Does the patient need to apply for Medicaid?: No  Financial Coordination Notified?: No      Mental Health/Substance Use Assessment  History or Current Mental Health Care Needs?: Yes  Provide Symptoms: Bipolar, Other psychiatric disorder (specify in comments)  Additional comments, including mental health treatment, providers and psychiatric hospitalizations: pt reports [facility] and will talk with provider about going to therapy  there.  History of Substance Use: No  Patient currently utilizing MAT?: No  Additional Comments, inc. substance use treatment history: reports only drinks alcohol socially but rarely.  Performed brief intervention including: expression of concern for unhealthy substance use; feedback linking use with associated health risks; advice and resources to support reduced use or abstinence: No  Reason brief intervention was not performed?:  (no reported substance use)    Assessment and Plan    Pt is a 63 year old woman presents with increasing pain in her right knee despite conservative treatment. The patient has had increasing pain and difficulty with ambulation. She can only walk a short distance before she has to stop. She has a genu valgum deformity and x-rays with severe lateral compartment arthritis. She has had extensive conservative treatment without improvement. Risks and benefits of a right total knee replacement were discussed at length. The patient understands this and consents for surgery. Pt got a right knee replacement and is recovering.     SW was consulted for homelessness and food insecurity. Pt is not known to SW.     SW met with pt at bedside with CM introducing self, role and reason for consult. Pt was AOx4 and when asked about the homelessness pt stated she was not homeless and lives in a house in [place] alone. Pt did ask that SW puts a MOW referral in as pt needs assistance getting food while recovering for surgery. SW let her know she would put that in and come back to confirm.     SW placed a referral  for MOWs through [facility]. SW went back in pt room to let her know and review basic demographic info. Pt confirmed all basic demographic info. Pt is retired receiving SSI and SNAP. Pt utilized PT-1 for transportation and reported her sister as a big support. Pt sister will be staying with her while she recovers from surgery. Pt also reports having a CM Jillian through [employer] as well as receives homecare service. Pt reported she s/w homecare service who stated they will help with cooking while she recovers from surgery as well.Pt reported no substance use stating she only drinks alcohol socially/rarely. Pt has dx of PTSD and bipolar. She goes to [facility] and is going to request therapy through there. Pt reported she has concerns about ambulated at home while recovering. SW explained PT is consulted and will see what services she may need upon d/c.     SW relayed this information to CM.     Plan and Interventions     Medical plan ongoing   PT/OT consulted   SW placed MOWs referral   Pt has service through [facility]; CM Jillian and receives home care services.   No further SW needs     Gurney Manila, LCSW

## 2024-03-26 NOTE — Progress Notes (Signed)
 Physical Therapy Treatment  Progress Note    Patient Name: Sandra Villegas  MRN: [id number]  DOB: 1960/09/27  Today's Date: 03/26/2024    Subjective      Pt in bed, agreeable to attempt step in/out of bed.    Objective     General Information  General Info  PT Received On: 03/26/24  Following Therapy Session:: Call bell within reach, Patient in Bed, Bed Alarm Activated, Nursing Staff Aware of Patient Location  Plan of Care Reviewed With:: Patient, RN/Charge Nurse, Case Manager, MD/PA/NP, OT  Recommended mobility with nursing staff: 1A amb to bathroom with RW, hands on assist for ADLs    Precautions  Precautions  Weight Bearing Status-1: Weight Bearing as Tolerated, RLE  Other Precautions: Fall Risk, Brace/Orthotics  Activity Restrictions: KI when amb    Pain  Pain Assessment  Pain Assessment:  (minimal pain at rest, moderate pain when mobilizing/WB)  Pain Type: Acute pain, Surgical pain  Pain Location: Knee  Pain Orientation: Right  Pain Management Interventions: care clustered, position adjusted, cold applied, premedicated for activity        Cognition  Overall Cognitive Status: Impaired  Following Commands: Follows one step commands  Safety Judgment: Decreased Safety awareness  Awareness of Errors/Deficits: Impaired  Attention Span: Intact  Memory: Impaired  Problem Solving: Impaired  Communication: Not Impaired  Behavioral/Affect Comments: Calm, cooperative    Integumentary  Integumentary  Integumentary: dressing R knee noted to have multiple areas of drainage - RN aware  LDA Integrity:  (PIV, dressing (PICO))       Mobility Assessment  Bed Position: HOB elevated, Use of bedrail  Supine to Sit: Supervision  Sit to Supine: Minimal Assist  Bed Mobility Comments: Using brace for supine>sit    Sit to Stand: Minimal Assist  Stand to Sit: Minimal Assist  Assistive Device: Rolling Walker  Transfers Comments: VCs for hand placement with transfers  Slid off high bed to get feet to floor, not using step with hand  rail.  Attempted to use step to return to bed, pt unable to lift LLE high enough to get on step.  Transferred to partial sit/lean against bed, lifted L foot onto step and assisted lifting/scooting back onto bed.      Distance (Feet): 2' forward/backward  Assistance Level: Minimal Assist  Assistive Device: Rolling Walker  Gait Characteristics: multiple LOBs with very short bout of amb.  Ambulation Comments: Increased pain and fatigued with limited mobility.    Number of Stairs: unable         [place]  [employer] Balance Scale: Yes  [employer] Sitting Balance Scale: 4+ - Independently moves and returns to center of gravity 1-2 inches in multiple planes.  [employer]U Standing Balance Scale: 2 - Independently supports self with both upper extremities.    AMPAC - (Basic Mobility Inpatient Short Form) How much help from another person do you currently need?  Turning from your back to your side while in a flat bed without using bedrails?: 3 - A little  Moving from lying on your back to sitting on the side of a flat bed without using bedrails?: 3 - A little  Moving to and from a bed to a chair (including a wheelchair)?: 3 - A little  Standing up from a chair using your arms (e.g., wheelchair, or bedside chair)?: 3 - A little  To walk in hospital room?: 3 - A little  Climbing 3-5 steps with a railing?: 2 - A Lot  Raw Score: 17  Therapeutic Exercise  Seated APs, quad sets, glute sets, minimal heel slide RLE.  X10 reps              Education  Education  Education Provided: Role of PT/Plan of Care, Discharge Planning, Adaptive Equipment/Assistive Device, Development Worker, Community, Safety Awareness/Fall Risk, Activities Guidelines  Education Provided to: Patient  Teaching Method: Demonstration, Discussion  Barriers to learning: Acuity of illness  Learning Evaluation: Verbalizes understanding, Demonstrates/applies knowledge, Needs reinforcement/further teaching, Needs practice    Assessment   Pt very limited mobility with significant difficulty  getting into high bed/using step.  Does not appear safe for d/c home this date, discussed with pt.  Recommend rehab, however not specifically discussed with pt per surgeon/PA protocol.  Discussed over TT with PA, RN, CM, OT.    Goals  Short Term Goals  Date Established/Amended: 03/26/24  Goal timeframe: 2 weeks  Bed mobility goal: Supine<>sit independent  Transfers goal: Sit<>stand independent  Ambulation goal: Amb 300' mod I w/ RW  Stairs goal: Complete 15 steps mod I  Problem specific goal 1: ROM R knee 0-90  Problem specific goal 2: don/doff KI no assist         Plan   Plan: Continued Skilled Inpatient PT Services   Treatment Interventions: Therapeutic exercise, Assistive device training, Functional mobility training, Gait training, Stair training, Neuromuscular Re-education/balance, Patient/family education, Energy conservation  PT Frequency and Duration: 5-7 x/week until Discharge    Recommendations  Anticipate Discharge to: Rehab  Recommended mobility with nursing staff: 1A amb to bathroom with RW, hands on assist for ADLs    Devaughn Sick, PT   lic # [id number]

## 2024-03-26 NOTE — Consults (Signed)
 Occupational Therapy Evaluation     Patient Name: Sandra Villegas  MRN: [id number]  DOB: April 16, 1961  Today's Date: 03/26/2024    Subjective   HPI/Hospital Course: Pt is 63yo female with PMH significant for MS, CVA, fibromyalgia, memory loss, anxiety, cognitive impairment, PTSD, prediabetes, HTN, Bipolar disorder.     Pt presents s/p R TKA on 03/25/2024 by Dr. Brinda. RLE WBAT and Knee Immobilizer. Pt cleared to work with therapy by RN.    Dx: R TKA      Problem List[1]    Medical History[2]    Surgical History[3]    Objective   General Information  General Info  OT Received On: 03/26/24  Following Therapy Session:: Call bell within reach, Patient in Chair, Nursing Staff Aware of Patient Location  Plan of Care Reviewed With:: Patient, RN/Charge Nurse, Case Manager, PT  Recommended mobility with nursing staff: 1A amb to bathroom with RW, hands on assist for ADLs  Eval Information  Type of Evaluation: Initial Evaluation    Precautions  Precautions  Weight Bearing Status-1: Weight Bearing as Tolerated, RLE  Other Precautions: Fall Risk, Brace/Orthotics  Activity Restrictions: KI when amb    Home Living  Was Patient Admitted from STR?: No  Lives With: Alone  Type of Home: House  Home Layout: One Level  Number of Stairs to Enter Home: steep grade to the stairs, 15 steps to front door  Home Equipment: Single Point [place], Other (Comment) (acquired a rollator for recovery)    Prior Level of Function  Information Provided By: Patient  Independent at Baseline: All Household Mobility, All Community Mobility, All ADL's/IADL's (using cane intermittently)  Comments: Pt will have sister staying with her, but due to sister's own orthopedic issues will not be able to provide any assist with mobility, ADLs, meal prep.  Has a high level bed and requires a foot stool to get in and out.    Pain  Pain Assessment  Pain Assessment: No/denies pain (at rest, 7-8/10 when mobilizing, perseverates on pain)      Cognition  Cognition  Overall  Cognitive Status: Impaired  Safety Judgment: Decreased Safety awareness  Awareness of Errors/Deficits: Impaired  Attention Span: Intact  Memory: Impaired  Problem Solving: Impaired  Communication: Not Impaired  Behavioral/Affect Comments: Repetitive with comments, questions                        Integumentary  Integumentary  Integumentary: dressing R knee noted to have multiple areas of drainage - RN made aware to assess  LDA Integrity: Intact Pre and Post Therapy (PIV, KI, PICO dressing over R knee)           Functional Mobility Assessment  Bed Position: HOB elevated, Use of bedrail  Supine to Sit: Supervision  Bed Mobility Comments: VCs for holding brace to support leg with transfer    Sit to Stand: Minimal Assist  Stand to Sit: Minimal Assist  Toilet Transfer: Minimal Assist, Cues for Hand Placement, Cues for Sequencing (commoder over toliet)  Transfers Comments: VCs for hand placement with transfers    Distance: To bathroom, From bathroom  Assistance Level: Minimal Assist  Assistive Device: Rolling Walker  Comments: Pt unsteady req mod reliance on RW and limited by pain. Req incr time/effort to mobilize short dist in bathroom and back.    Sitting Balance - Static: good  Standing Balance - Static: fair with RW  Standing Balance - Dynamic: fair with RW    ADL's  Grooming: Contact guard (hand hygiene stand sinkside)  Lower Body Dressing: Moderate Assist (KI & hike underware pre/post)  Toileting: Moderate Assist (balance during hygiene, sup while seated)  ADL Comments: Req incr time/effort with max VCs for tech & educ t/o all ADLs, limited carryover.  AMPAC - (Daily Activity Inpatient Short Form) How much help from another person do you currently need?  Putting on and taking off regular lower body clothing?: 2- A Lot  Bathing (including washing, rinsing, drying)?: 2- A Lot  Toileting, which includes using toilet,bedpan or urinal?: 2- A Lot  Putting on and taking off regular upper body clothing?: 3 - A  Little  Taking care of personal grooming such as brushing teeth?: 3 - A Little  Eating meals?: 4 - None  Raw Score: 16      Education  Education  Education Provided: Role of OT/Plan of Care, Discharge Planning, ADL Training, Adaptive Equipment/Assistive Device, Development Worker, Community, Safety Awareness/Fall Risk, Weight Bearing Status, Splinting/Bracing Orthotics  Education Provided to: Patient  Teaching Method: Discussion, Demonstration  Learning Evaluation: Verbalizes understanding, Demonstrates/applies knowledge, Needs reinforcement/further teaching, Needs practice    Assessment   Pt seen for initial OT eval this date and presenting to be functioning below her functional baseline. Pt limited by pain, decreased activity tolerance, decreased safety awareness, difficulty following commands, decreased ADL independence, impaired functional mobility, and post op pain this session. Pt requiring increased time/effort and assistance to complete functional ADLS and functional mobility. Pt presenting with deficits listed below, impacting functional independence. Pt educ on adaptive ADL techs, knee immobilizer management, one handed ADL techniques, safe tech t/o all functional tasks, and weight bearing status during session and demonstrate fair carryover of all education provided. Pt would benefit from ongoing OT at this time to address deficits listed below. Based on clinical judgement, recommend rehab to maximize pt's functional independence upon discharge. Will continue to follow throughout hospital admission.       Patient presents with ADL/Self care deficit, IADL deficit, Functional Mobility deficit, Impaired activity tolerance, Impaired safety awareness, Balance deficit, Pain       Prognosis: Good       Goals   Short Term Goals  Date Established/Amended: 03/26/24  Goal timeframe: 1 week  Goal 1: Pt will complete all fx mob with Sup and RW in prep for ADLs  Goal 2: Pt will complete LB dressing with Min A  Goal 3: Pt will  complete tolieting routine with Min A             Plan   Plan: Continued skilled Inpatient OT services   Treatment Interventions: ADLs/Self Care Training, Patient/Caregiver Education, Bed Mobility, Functional Mobility, Transfer Training, Paediatric Nurse, Engineer, Civil (consulting)  OT Frequency and Duration: 2-3 x/week until Discharge  Treatment:      Recommendations  Anticipate Discharge to: Rehab  Recommended mobility with nursing staff: 1A amb to bathroom with RW, hands on assist for ADLs    Wells Paterson, OT  [place] lic # [id number]       [8]   Patient Active Problem List  Diagnosis    Fibrocystic breast    Depression    Relapsing remitting multiple sclerosis    Stroke    Dementia    Memory loss    Fibromyalgia, primary    Anxiety    Brain concussion    Arthritis    Furuncle    Rash and other nonspecific skin eruption    S/P arthroscopic surgery of left knee  Screening for malignant neoplasm of cervix    Seasonal allergic rhinitis due to pollen    Snoring    Enlarged thyroid    Thyroid nodule    Traumatic injury    Unspecified asthma, uncomplicated    Eczema    Dermatitis    Other atopic dermatitis    Psoriasis    Vesicular palmoplantar eczema    White matter abnormality on MRI of brain    Impaired cognition    Memory changes    Chronic pain    Fibromyalgia    Mixed anxiety and depressive disorder    Pelvic floor dysfunction in female    Plantar fasciitis of right foot    Plantar wart    Postmenopausal vaginal bleeding    Posttraumatic stress disorder    Prediabetes    Primary insomnia    Psoriatic arthritis    Dysuria    Incontinence of feces    Urinary incontinence    Panic attack    Other fatigue    Lateral knee pain    Pain in right knee    Arthralgia of knee    Left knee pain    Lichen simplex chronicus    Menopausal state    Mild recurrent major depression    Non-toxic multinodular goiter    Mixed hyperlipidemia    Hyperlipidemia    Hx of myomectomy    Homeless    Hidradenitis suppurativa    Herpes  labialis without complication    Herpes genitalis    Hand pain    Facial asymmetry    Essential hypertension    Dyslipidemia    Derangement of knee    COVID    Chondromalacia of patella    Obesity    Obesity due to excess calories    Morbid obesity    BMI 39.0-39.9,adult    Manic bipolar I disorder    Bipolar I disorder    Bipolar disorder, in full remission, most recent episode mixed    Bipolar 1 disorder, mixed, moderate    Attention-deficit hyperactivity disorder, predominantly inattentive type    At risk for long QT syndrome    Anemia    Acquired pes planus    Abnormal gait    Abdominal bloating    Seasonal allergies    Abnormal magnetic resonance imaging study    Burn of skin    Primary osteoarthritis of both knees    Vitamin D deficiency    Anemia, unspecified    Bipolar disorder    Multiple sclerosis    MS (multiple sclerosis)    Unilateral primary osteoarthritis, right knee    Unilateral primary osteoarthritis, left knee    Total knee replacement status, right   [2]   Past Medical History:  Diagnosis Date    Anxiety     Balance problem     Brain concussion     Depression     Fibrocystic breast     Fibromyalgia, primary     Lichen simplex chronicus     Manic bipolar I disorder     Memory loss     MS (multiple sclerosis)     Follows With [facility]    Numbness and tingling in both hands     Psoriatic arthritis     PTSD (post-traumatic stress disorder)     Snoring     Awaiting Results Of Sleep Study    SOB (shortness of breath) on exertion     Unilateral primary osteoarthritis, right  knee    [3]   Past Surgical History:  Procedure Laterality Date    BREAST BIOPSY      COLONOSCOPY      KYPHOSIS SURGERY  1987    MENISCECTOMY      MYOMECTOMY      PR TOTAL KNEE ARTHROPLASTY Right 03/25/2024    Procedure: Arthroplasty, Knee, Total;  Surgeon: Jayson JONETTA Police, MD;  Location: [facility];  Service: Orthopedic Surgery

## 2024-03-26 NOTE — Consults (Signed)
 Physical Therapy Evaluation    Patient Name: Sandra Villegas  MRN: [id number]  DOB: 1960-10-07  Evaluation Date: 03/26/2024    Subjective   HPI/Hospital Course:   NADIYA PIERATT is a 63 y.o. female with relevant PMH of MS, CVA, fibromyalgia, memory loss, anxiety, cognitive impairment, PTSD, prediabetes, HTN, Bipolar disorder who was admitted on 12/9 for elective TKA.     Problem List[1]    Medical History[2]    Surgical History[3]    Objective     General Information  General Info  PT Received On: 03/26/24  Following Therapy Session:: Call bell within reach, Patient in Chair, Nursing Staff Aware of Patient Location  Plan of Care Reviewed With:: Patient, OT, RN/Charge Nurse, Case Manager  Recommended mobility with nursing staff: 1A amb to bathroom with RW, hands on assist for ADLs  Eval Information  Type of Evaluation: Initial Evaluation    Precautions  Weight Bearing Status-1: Weight Bearing as Tolerated, RLE  Other Precautions: Fall Risk, Brace/Orthotics  Activity Restrictions: KI when amb    Home Living  Was Patient Admitted from STR?: No  Lives With: Alone  Type of Home: House  Home Layout: One Level  Number of Stairs to Enter Home: steep grade to the stairs, 15 steps to front door  Home Equipment: Single [place], Other (Comment) (acquired a rollator for recovery)    Prior Level of Function  Information Provided By: Patient  Independent at Baseline: All Household Mobility, All Community Mobility, All ADL's/IADL's (using cane intermittently)  Comments: Pt will have sister staying with her, but due to sister's own orthopedic issues will not be able to provide any assist with mobility, ADLs, meal prep.  Has a high level bed and requires a foot stool to get in and out.    Pain  Pain Assessment: No/denies pain (at rest, 7-8/10 when mobilizing, perseverates on pain)  Pain Type: Acute pain, Surgical pain  Pain Location: Knee  Pain Orientation: Right  Pain Management Interventions: care clustered, position  adjusted, premedicated for activity     Cognition  Overall Cognitive Status: Impaired  Safety Judgment: Decreased Safety awareness  Awareness of Errors/Deficits: Impaired  Attention Span: Intact  Memory: Impaired  Problem Solving: Impaired  Communication: Not Impaired  Behavioral/Affect Comments: Repetitive with comments, questions    Integumentary  Integumentary: dressing R knee noted to have multiple areas of drainage - RN made aware to assess  LDA Integrity: Intact Pre and Post Therapy (PIV, KI, PICO dressing over R knee)    ROM/Strength  PROM Right Lower Extremity  Overall RLE PROM: Impaired  R Knee Flexion 0-140: 68  R Knee Extension 0-130: lacking 12 degrees from extension  Strength Right Lower Extremity  RLE Overall Strength: Other (Comment) (grossly 2/5)  Strength Left Lower Extremity  LLE Overall Strength: WFL    Mobility Assessment  Bed Position: HOB elevated, Use of bedrail  Supine to Sit: Supervision  Bed Mobility Comments: VCs for holding brace to support leg with transfer    Sit to Stand: Minimal Assist  Stand to Sit: Minimal Assist  Assistive Device: Rolling Walker  Transfers Comments: VCs for hand placement with transfers    Distance (Feet): 15' x2  Assistance Level: Minimal Assist  Assistive Device: Rolling Walker  Gait Characteristics: step-to pattern with LLE, decreased cadence, increased pain, flexed posture and corrects with VCs.  Ambulation Comments: Increased pain and fatigued with limited mobility.              [place]   University Balance Scale: Yes  KU Sitting Balance Scale: 4+ - Independently moves and returns to center of gravity 1-2 inches in multiple planes.  KU Standing Balance Scale: 2+ -  Independently supports self with one upper extremity.    AMPAC - (Basic Mobility Inpatient Short Form) How much help from another person do you currently need?  Turning from your back to your side while in a flat bed without using bedrails?: 3 - A little  Moving from lying on your back to sitting on  the side of a flat bed without using bedrails?: 3 - A little  Moving to and from a bed to a chair (including a wheelchair)?: 3 - A little  Standing up from a chair using your arms (e.g., wheelchair, or bedside chair)?: 3 - A little  To walk in hospital room?: 3 - A little  Climbing 3-5 steps with a railing?: 2 - A Lot  Raw Score: 17         Education  Education Provided: Role of PT/Plan of Care, Discharge Planning, Adaptive Equipment/Assistive Device, Development Worker, Community, Safety Awareness/Fall Risk, Activities Guidelines, Splinting/Bracing/Orthotics (icing)  Education Provided to: Patient  Teaching Method: Discussion, Demonstration  Barriers to learning: Acuity of illness  Learning Evaluation: Verbalizes understanding, Demonstrates/applies knowledge, Needs reinforcement/further teaching, Needs practice    Assessment   Pt agreeable to participate.  Educated on use of KI, pt required mod assist to don/doff brace.  Completed limited amb to bathroom, assist from OT for hygiene care, then amb to chair.  Fatigued with increased pain with activity.  Pt concerned about home set up including steep hill to stairs, then 15 steps to get in, high bed at home, and sister will be around, but cannot provide physical assist.  Will cont to follow for d/c plan.    Patient presents with Impaired gait, Functional Mobility deficit, Impaired activity tolerance, Impaired safety awareness, Pain, Balance deficit, Strength deficit, Range of motion deficit.  she presents to PT functioning below her baseline independence with the above noted impairments. she  would benefit from skilled PT services to maximize safety and independence in mobilization.       Prognosis: Fair       Goals   Short Term Goals  Date Established/Amended: 03/26/24  Goal timeframe: 2 weeks  Bed mobility goal: Supine<>sit independent  Transfers goal: Sit<>stand independent  Ambulation goal: Amb 300' mod I w/ RW  Stairs goal: Complete 15 steps mod I  Problem specific goal 1:  ROM R knee 0-90  Problem specific goal 2: don/doff KI no assist         Plan   Plan: Continued Skilled Inpatient PT Services   Treatment Interventions: Therapeutic exercise, Assistive device training, Functional mobility training, Gait training, Stair training, Neuromuscular Re-education/balance, Patient/family education, Energy conservation  PT Frequency and Duration: 5-7 x/week until Discharge  Treatment:      Recommendations  Anticipate Discharge to: TBD depending on patient progress  Recommended mobility with nursing staff: 1A amb to bathroom with RW, hands on assist for ADLs  Devaughn Sick, PT  [place] lic # [id number]           [8]   Patient Active Problem List  Diagnosis    Fibrocystic breast    Depression    Relapsing remitting multiple sclerosis    Stroke    Dementia    Memory loss    Fibromyalgia, primary    Anxiety    Brain concussion    Arthritis  Furuncle    Rash and other nonspecific skin eruption    S/P arthroscopic surgery of left knee    Screening for malignant neoplasm of cervix    Seasonal allergic rhinitis due to pollen    Snoring    Enlarged thyroid    Thyroid nodule    Traumatic injury    Unspecified asthma, uncomplicated    Eczema    Dermatitis    Other atopic dermatitis    Psoriasis    Vesicular palmoplantar eczema    White matter abnormality on MRI of brain    Impaired cognition    Memory changes    Chronic pain    Fibromyalgia    Mixed anxiety and depressive disorder    Pelvic floor dysfunction in female    Plantar fasciitis of right foot    Plantar wart    Postmenopausal vaginal bleeding    Posttraumatic stress disorder    Prediabetes    Primary insomnia    Psoriatic arthritis    Dysuria    Incontinence of feces    Urinary incontinence    Panic attack    Other fatigue    Lateral knee pain    Pain in right knee    Arthralgia of knee    Left knee pain    Lichen simplex chronicus    Menopausal state    Mild recurrent major depression    Non-toxic multinodular goiter    Mixed hyperlipidemia     Hyperlipidemia    Hx of myomectomy    Homeless    Hidradenitis suppurativa    Herpes labialis without complication    Herpes genitalis    Hand pain    Facial asymmetry    Essential hypertension    Dyslipidemia    Derangement of knee    COVID    Chondromalacia of patella    Obesity    Obesity due to excess calories    Morbid obesity    BMI 39.0-39.9,adult    Manic bipolar I disorder    Bipolar I disorder    Bipolar disorder, in full remission, most recent episode mixed    Bipolar 1 disorder, mixed, moderate    Attention-deficit hyperactivity disorder, predominantly inattentive type    At risk for long QT syndrome    Anemia    Acquired pes planus    Abnormal gait    Abdominal bloating    Seasonal allergies    Abnormal magnetic resonance imaging study    Burn of skin    Primary osteoarthritis of both knees    Vitamin D deficiency    Anemia, unspecified    Bipolar disorder    Multiple sclerosis    MS (multiple sclerosis)    Unilateral primary osteoarthritis, right knee    Unilateral primary osteoarthritis, left knee    Total knee replacement status, right   [2]   Past Medical History:  Diagnosis Date    Anxiety     Balance problem     Brain concussion     Depression     Fibrocystic breast     Fibromyalgia, primary     Lichen simplex chronicus     Manic bipolar I disorder     Memory loss     MS (multiple sclerosis)     Follows With [facility]    Numbness and tingling in both hands     Psoriatic arthritis     PTSD (post-traumatic stress disorder)     Snoring  Awaiting Results Of Sleep Study    SOB (shortness of breath) on exertion     Unilateral primary osteoarthritis, right knee    [3]   Past Surgical History:  Procedure Laterality Date    BREAST BIOPSY      COLONOSCOPY      KYPHOSIS SURGERY  1987    MENISCECTOMY      MYOMECTOMY      PR TOTAL KNEE ARTHROPLASTY Right 03/25/2024    Procedure: Arthroplasty, Knee, Total;  Surgeon: Jayson JONETTA Police, MD;  Location: [facility] OR;  Service: Orthopedic Surgery

## 2024-03-26 NOTE — Progress Notes (Addendum)
 03/26/24 1048   Case Management Initial Assessment   Source of Information Patient   Type of Residence Single level Home   Lives With Alone   Patient Information   Accompanied by Family   Current Patient Responsibilities Housekeeping;Personal Care;Shopping;Meal Prep   Current Functional Status   Bathing Minimal assist   Toileting Minimal Assist   Walking in Home Minimal Assist   Community Mobility Minimal Assist   Financial Information   Insurance Confirmed with Patient Yes   Discharge Planning   Patient/Family/Caregiver Discharge Preference Home w/ Services   Anticipated Discharge Disposition Home w/ VNA   Next Actions   Assessment/High Risk Screening Complete       Procedure: Arthroplasty, Knee, Total (Right: Knee)      Pt lives indep at home alone. Single level home. Will need RW. SABRA  Home services has been recommended. Patient/family given list of VNA agency choices. Patient/family agreeable for referral to be placed per patient preference of agency. Made pt aware of [facility] affiliation with [facility]. Patient/family agreeable to plan and agreebale to any agency that can accommodate patients needs.        Per SW, pt requsting MoW, pt states she has [employer] CM Jillian, Has homemaker who clenas a  Corean Hailey RN, Case Manager 03/26/2024 10:49 AM

## 2024-03-26 NOTE — Nursing Note (Signed)
 As this RN was assisting patient to the bathroom, patient reached behind herself and reported losing her balance, falling back and catching herself on the edge of bed. This RN helped lower patient to the ground. No head strike. No visible injuries. Vitals stable. Multiple staff in to assist patient back to bed. Call light made within reach. Bed in lowest position. Bed alarm on.     On call provider June Stott made aware, no new orders at this time.

## 2024-03-26 NOTE — Progress Notes (Signed)
 Patient seen this morning, reports pain under good control. VS stable ovenight no overnight events. Nursing reported Pt ambulated to bathroom without issue ovenight.    Physical exam    General: Well appearing female in no acute distress  Respiratory: non labored respirations  Cardiac: pulses equal in all extremities  Musculoskeletal: Appropriate swelling and ecchymosis of the right knee. PICO dressing has minor sanguineous drainage at the distal aspect of the dressing, otherwise clean dry and intact. No signs of infection. Patient unable to straight leg raise with visible quad contraction. No calf tenderness in the bilateral extremities. Sensation intact in the distal lower extremities bilaterally. Patient able to plantar and  dorsiflex ankles bilaterally. Feet warm by laterally.     A&P    Pt POD #1 Right TKR doing well  -continue pain control  --continue DVT prophylaxis  -Continue physical therapy WBAT with walker and use KI for ambulation only. May D/C KI when able to perform a straight leg raise  -Discharge home when cleared by physical therapy

## 2024-03-27 LAB — CBC WITH DIFFERENTIAL
Basophils %: 0.6 %
Basophils Absolute: 0.03 K/uL (ref 0.00–0.22)
Eosinophils %: 1.1 %
Eosinophils Absolute: 0.06 K/uL (ref 0.00–0.50)
Hematocrit: 27 % — ABNORMAL LOW (ref 32.0–47.0)
Hemoglobin: 9 g/dL — ABNORMAL LOW (ref 11.0–16.0)
Immature Granulocytes %: 0.7 %
Immature Granulocytes Absolute: 0.04 K/uL (ref 0.00–0.10)
Lymphocyte %: 10.7 %
Lymphocytes Absolute: 0.58 K/uL — ABNORMAL LOW (ref 0.70–4.00)
MCH: 27.7 pg (ref 26.0–34.0)
MCHC: 33.3 g/dL (ref 31.0–37.0)
MCV: 83.1 fL (ref 80.0–100.0)
MPV: 9.6 fL (ref 9.1–12.4)
Monocytes %: 17 %
Monocytes Absolute: 0.92 K/uL — ABNORMAL HIGH (ref 0.36–0.77)
NRBC %: 0 % (ref 0.0–0.0)
NRBC Absolute: 0 K/uL (ref 0.00–2.00)
Neutrophil %: 69.9 %
Neutrophils Absolute: 3.79 K/uL (ref 1.50–7.95)
Platelets: 179 K/uL (ref 150–400)
RBC: 3.25 M/uL — ABNORMAL LOW (ref 3.70–5.20)
RDW-CV: 13.8 % (ref 11.5–14.5)
RDW-SD: 41.4 fL (ref 35.0–51.0)
WBC: 5.4 K/uL (ref 4.0–11.0)

## 2024-03-27 LAB — PROTIME-INR
INR: 1.04
Protime: 11.1 s (ref 9.3–11.6)

## 2024-03-27 MED ORDER — ZOLPIDEM 5 MG TABLET
5 | Freq: Every evening | ORAL | Status: DC
Start: 2024-03-27 — End: 2024-03-27

## 2024-03-27 MED FILL — GABAPENTIN 300 MG CAPSULE: 300 300 mg | ORAL | Qty: 1 | Fill #0

## 2024-03-27 MED FILL — OXYCODONE 5 MG TABLET: 5 5 mg | ORAL | Qty: 2 | Fill #0

## 2024-03-27 MED FILL — ACETAMINOPHEN 325 MG TABLET: 325 325 mg | ORAL | Qty: 3 | Fill #0

## 2024-03-27 MED FILL — BACLOFEN 10 MG TABLET: 10 10 mg | ORAL | Qty: 1 | Fill #0

## 2024-03-27 MED FILL — OXYCODONE 5 MG TABLET: 5 5 mg | ORAL | Qty: 1 | Fill #0

## 2024-03-27 MED FILL — VENLAFAXINE ER 75 MG CAPSULE,EXTENDED RELEASE 24 HR: 75 75 mg | ORAL | Qty: 1 | Fill #0

## 2024-03-27 MED FILL — CELECOXIB 200 MG CAPSULE: 200 200 mg | ORAL | Qty: 1 | Fill #0

## 2024-03-27 MED FILL — BISACODYL 5 MG TABLET,DELAYED RELEASE: 5 5 mg | ORAL | Qty: 2 | Fill #0

## 2024-03-27 MED FILL — DOCUSATE SODIUM 100 MG CAPSULE: 100 100 mg | ORAL | Qty: 1 | Fill #0

## 2024-03-27 MED FILL — LAMOTRIGINE 25 MG TABLET: 25 25 mg | ORAL | Qty: 2 | Fill #0

## 2024-03-27 MED FILL — TRAZODONE 100 MG TABLET: 100 100 mg | ORAL | Qty: 2 | Fill #0

## 2024-03-27 MED FILL — ZOLPIDEM 5 MG TABLET: 5 5 mg | ORAL | Qty: 2 | Fill #0

## 2024-03-27 MED FILL — APIXABAN 2.5 MG TABLET: 2.5 2.5 mg | ORAL | Qty: 1 | Fill #0

## 2024-03-27 MED FILL — HYDROXYZINE HCL 25 MG TABLET: 25 25 mg | ORAL | Qty: 1 | Fill #0

## 2024-03-27 NOTE — Care Plan (Addendum)
 Goal Outcome Evaluation:         Upon checking vitals early in the morning, the patient was noted to have an O2 sat around 85-87%. This RN woke the patient up and encouraged use of the incentive spirometer. When fully awake, patients O2 sat rose to 96-99%. Continued to encourage IS use throughout night and educated patient on benefits. This RN reached out to the on call provider, see orders. Patient is now on 2L of oxygen via NC, O2 sat 96%. PRNs utilized with good effect. Knee immobilizer in place.         Problem: Adult Inpatient Plan of Care  Goal: Absence of Hospital-Acquired Illness or Injury  Outcome: Ongoing, Not Progressing

## 2024-03-27 NOTE — Discharge Instructions (Signed)
 TOTAL KNEE POST OP ORDERS     You will be sent home with a knee immobilizer and a bulky dressing (silverlon,gauze wrap and ace bandage). You may take the ACE and gauze wrapping off the second day but leave the silverlondressing.The Silverlon dressing in place currently will stay on for 5 days after it was placed. On POD#5 or POD#6, it will be changed daily to a dry dressing until seen at follow up in 2 weeks in the office. You may change the dressing or PT. If by tomorrow when you take off the ACE wrap and bulky dressing, the Silverlon is soiled, you may change it to the one you got sent home with. To shower, use Press 'n' Seal Saran Wrap to keep the dressing and incision dry.     Weight-bearing as tolerated with a walker or crutches. Once you can straight leg raise without assistance, you can get rid of the brace and continue with a walker/crutches/cane.     Home Health Services will come to the house and assist with dressing changes and PT. The goal of PT is to have the knee bent to 90 degrees and straightened all the way by the 2 week follow up.     DVT (blood clot) prophylaxis: continue using TED Stockings and foot pumps. Continue taking Aspirin 81mg  twice a day for 3 weeks post op.     Medications: We have sent you home with Celebrex, Gabapentin, Tylenol and a narcotic medication for pain control. If you become dizzy or excessively drowsy after taking gabapentin, please call the office and let us know. We will wean you off this medication. You will run out of pain medicine before you see [place] back at 2 weeks post op, since we can only write for 1 weeks worth of medicine at a time. Please call the office during the day, during office hours so we can send you a refill if you require one.      Follow up with Dr. Rush Barer in the office in 2 weeks. Please call the office with any questions or concerns! If you need a new prescription for pain medication, please call the office during business hours so we can complete  the refill request.

## 2024-03-27 NOTE — Progress Notes (Signed)
 Case Management Progress Note:       +auth for [facility] acton. Physician agreed transfer is medically appropriate.  Pt agreeable.    Booked for 4p.   MD, Rn, liaison , patient and family aware.   Corean Hailey RN, Case Manager 03/27/2024 2:53 PM

## 2024-03-27 NOTE — Progress Notes (Signed)
 Physical Therapy Treatment  Progress Note    Patient Name: Sandra Villegas  MRN: [id number]  DOB: Apr 30, 1960  Today's Date: 03/27/2024    Subjective      Pt agreeable to PT session, I don't have as much pain when I'm not moving    Objective     General Information  General Info  PT Received On: 03/27/24  Following Therapy Session:: Call bell within reach, Patient in Bed, Bed Alarm Activated, Nursing Staff Aware of Patient Location  Plan of Care Reviewed With:: Patient, RN/Charge Nurse  Recommended mobility with nursing staff: 1A amb as tol w/ RW    Precautions  Precautions  Weight Bearing Status-1: Weight Bearing as Tolerated, RLE  Other Precautions: Fall Risk, Brace/Orthotics  Activity Restrictions: KI when amb    Pain  Pain Assessment  Pain Assessment: 0-10  Pain Score: 3  Pain Type: Acute pain, Surgical pain  Pain Location: Knee  Pain Orientation: Right  Pain Descriptors: Aching  Pain Management Interventions: care clustered, position adjusted          Vital Signs  Oxygen Therapy  Oxygen Therapy: Supplemental oxygen  O2 Delivery Method: Nasal cannula  O2 Flow Rate (L/min): 2 L/min    Cognition  Following Commands: Follows one step commands  Safety Judgment: Decreased Safety awareness  Awareness of Errors/Deficits: Impaired  Attention Span: Intact  Memory: Intact  Problem Solving: Impaired  Communication: Not Impaired  Behavioral/Affect Comments: Calm, cooperative    Integumentary  Integumentary  Integumentary: R knee ace wrap, pico, NC, PIV  LDA Integrity: Intact Pre and Post Therapy    ROM/Strength  ROM/Strength Progress Update:: R knee ROM 0-8-70 degrees           Mobility Assessment  Bed Position: HOB elevated  Supine to Sit: Minimal Assist  Sit to Supine: Minimal Assist  Scooting: Modified Independent  Bed Mobility Comments: supports leg transfer with brace IND, requires assist at RLE to lower leg to sit EOB d/t pain, assist at LE to return to supine, denies symptoms with bed mobility    Sit to  Stand: Contact Guard  Stand to Sit: Engineer, Manufacturing Systems Device: Agricultural Consultant  Transfers Comments: VC for HP, reports L knee pain with transfers, increased time to complete, no LOB    Distance (Feet): 8x2  Assistance Level: Engineer, Manufacturing Systems Device: Tree Surgeon Characteristics: decreased cadence and gait speed, step to pattern  Ambulation Comments: increased pain and fatigue limiting mobility, no overt LOB              [place]  University Balance Scale: Yes  KU Sitting Balance Scale: 4+ - Independently moves and returns to center of gravity 1-2 inches in multiple planes.  KU Standing Balance Scale: 2 - Independently supports self with both upper extremities.    AMPAC - (Basic Mobility Inpatient Short Form) How much help from another person do you currently need?  Turning from your back to your side while in a flat bed without using bedrails?: 4 - None  Moving from lying on your back to sitting on the side of a flat bed without using bedrails?: 3 - A little  Moving to and from a bed to a chair (including a wheelchair)?: 3 - A little  Standing up from a chair using your arms (e.g., wheelchair, or bedside chair)?: 3 - A little  To walk in hospital room?: 3 - A little  Climbing 3-5 steps with a railing?: 2 - A Lot  Raw Score: 18         Therapeutic Exercise  Supine Therapeutic Exercises  Ankle Pumps: Bilateral, AROM  Quad Set: Bilateral, AROM  Glute Set: Bilateral, AROM  Heel Slide: Bilateral, AAROM  Hip Abduction/Adduction: Bilateral, AAROM  Straight Leg Raise:  (attempted, pt unable to complete)              Education  Education  Education Provided: Role of PT/Plan of Care, Discharge Planning, Adaptive Equipment/Assistive Device, Development Worker, Community, Safety Awareness/Fall Risk, Activities Guidelines  Education Provided to: Patient  Teaching Method: Demonstration, Discussion  Barriers to learning: Acuity of illness  Learning Evaluation: Verbalizes understanding, Demonstrates/applies knowledge, Needs  reinforcement/further teaching, Needs practice    Assessment   Pt seen for progression of functional mobility training. Pt is making slow progress towards goals, however is still presenting below reported baseline level of function. This date, pt completes transfers and short distance ambulation with CGA level with RW. Pt with improved stability on feet however requires increased time and effort to ambulate short distances, continues to be limited by pain and limited ROM. Assist required for bed mobility at lowest bed position.     Patient will continue to benefit from skilled acute PT services to improve functional mobility, maximize safety at discharge, and work towards physical therapy goals. Continue to recommend discharge to rehab when medically cleared.       Goals  Short Term Goals  Date Established/Amended: 03/26/24  Goal timeframe: 2 weeks  Bed mobility goal: Supine<>sit independent  Transfers goal: Sit<>stand independent  Ambulation goal: Amb 300' mod I w/ RW  Stairs goal: Complete 15 steps mod I  Problem specific goal 1: ROM R knee 0-90  Problem specific goal 2: don/doff KI no assist         Plan   Plan: Continued Skilled Inpatient PT Services   Treatment Interventions: Therapeutic exercise, Assistive device training, Functional mobility training, Gait training, Stair training, Neuromuscular Re-education/balance, Patient/family education, Energy conservation  PT Frequency and Duration: 5-7 x/week until Discharge    Recommendations  Anticipate Discharge to: Rehab  Recommended mobility with nursing staff: 1A amb as tol w/ RW    Sarah Jordan, PT  [place] lic # [id number]

## 2024-03-27 NOTE — Discharge Summary (Signed)
 Discharge Summary    Discharge Diagnosis  Unilateral primary osteoarthritis, right knee    Hospital Course    Admission Date: 03/25/24  Discharge Date:03/27/24  Admitting Physician: Dr. Jayson Police    Sandra Villegas is a 63 year old female who previously presented outpatient to [facility] in office for  right knee pain. patient had failed conservative treatments and after discussing the risks and benefits of surgery, the patient elected to undergo surgical management. X-ray imaging obtained in office revealed severe lateral compartment arthritis and genu valgum deformity         On 03/25/24 the patient presented to the preop holding area was prepped for surgery. Anesthesia administered preoperative adductor canal block for postoperative pain control period following the operation the patient was admitted to the PACU and then M3 for postoperative pain control and to monitor her vitals. The patient was not seen by physical therapy on this day    On 03/26/24 the patient was seen in the morning  and reported pain under good control. Vital signs stable overnight, no overnight events.  The patient was seen with physical therapy and was recommended for rehab the patient did not get authorization for rehab and the patient stayed overnight     On 03/27/2024 the patient was seen in the morning reported pain under good control. The patient had oxygen saturation dip to 80% in the evening and at night she had slid out of bed had fallen onto her buttocks did not strike her knee or head.  The patient's home dose of Ambien  was decreased and the patient was placed on nasal cannula. The patient can you work with physical therapy and rehab still recommended.  The patient obtained authorization for rehab and was discharged later in the day        Pertinent Physical Exam At Time of Discharge  Physical Exam  General: Well appearing  Female in no acute distress  Respiratory: non labored respirations on nasal cannula  Cardiac: pulses equal  in all extremities  Musculoskeletal: Appropriate swelling and ecchymosis of the  right knee.  Pico dressing unchanged in appearance. No signs of infection. Patient  unable to straight leg raise with visible quad contraction. No calf tenderness in the bilateral extremities. Sensation intact in the distal lower extremities bilaterally. Patient able to plantar and  dorsiflex ankles bilaterally. Feet warm bilaterally.  Issues Requiring Follow-Up   Routine follow up 2 weeks postop in Dr. Ottie office      Patient was screened for Social Drivers of Health during this encounter and found to have concerns in the following domains:   financial insecurity  transportation insecurity    The After Visit Summary provided to the patient includes information for Find Help, a zip code based platform that assists users with finding resources near them. The following interventions were also done:     Patient was screened for Social Drivers of Health during this encounter and found to have concerns in the following domains:   financial insecurity  transportation insecurity    The After Visit Summary provided to the patient includes information on how to find resources related to these needs.                            Outpatient Follow-Up  Future Appointments   Date Time Provider Department Center   04/01/2024  4:15 PM [facility] [place]   04/01/2024  4:55 PM [facility]  [facility] [place]   04/01/2024  5:15 PM [facility] [place]   04/25/2024  1:00 PM [facility] [place]   05/20/2024 11:00 AM Mustafa CHRISTELLA Daria Spears, MD [facility]

## 2024-03-27 NOTE — Progress Notes (Signed)
 Patient is seen this morning she reports pain under good control. Patient's oxygen dipped to 80 last night.  The patient has a baseline dose of trazodone  200 mg and Ambien  10 mg.  The patient was placed on nasal cannula overnight. The patient had a fall where she slid out of the bed and landed on her buttocks she did not strike her knee or her head overnight. Patient was seen by Physical therapy yesterday and they recommended rehab    Physical exam    General: Well appearing  Female in no acute distress  Respiratory: non labored respirations on nasal cannula  Cardiac: pulses equal in all extremities  Musculoskeletal: Appropriate swelling and ecchymosis of the  right knee.  Pico dressing unchanged in appearance. No signs of infection. Patient  unable to straight leg raise with visible quad contraction. No calf tenderness in the bilateral extremities. Sensation intact in the distal lower extremities bilaterally. Patient able to plantar and  dorsiflex ankles bilaterally. Feet warm by laterally.     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

## 2024-03-27 NOTE — Progress Notes (Signed)
 03/26/24 1048   Case Management Initial Assessment   Source of Information Patient   Type of Residence Single level Home   Lives With Alone   Patient Information   Accompanied by Family   Current Patient Responsibilities Housekeeping;Personal Care;Shopping;Meal Prep   Current Functional Status   Bathing Minimal assist   Toileting Minimal Assist   Walking in Home Minimal Assist   Community Mobility Minimal Assist   Financial Information   Insurance Confirmed with Patient Yes   Discharge Planning   Patient/Family/Caregiver Discharge Preference Home w/ Services   Anticipated Discharge Disposition Home w/ VNA   Next Actions   Assessment/High Risk Screening Complete       Procedure: Arthroplasty, Knee, Total (Right: Knee)      Pt lives indep at home alone. Single level home. Will need RW. SABRA  Home services has been recommended. Patient/family given list of VNA agency choices. Patient/family agreeable for referral to be placed per patient preference of agency. Made pt aware of [facility] affiliation with [facility]. Patient/family agreeable to plan and agreebale to any agency that can accommodate patients needs.        Per SW, pt requsting MoW, pt states she has [employer] [facility], Has homemaker who clenas a  Corean Hailey RN, Case Manager 03/26/2024 10:49 AM       12/10 PT has recommended SNF. Patient/family given list of choices of providers. Patient/family agreeable to referrals of choice.  Agreeable to [facility] and [facility].  Corean Hailey RN, Case Manager 03/26/2024 2:49 PM       Case Management Progress Note:   12/11 [facility] can accept pending auth. Corean Hailey RN, Case Manager 03/27/2024 9:42 AM

## 2024-04-01 ENCOUNTER — Inpatient Hospital Stay: Admit: 2024-04-01 | Payer: MEDICARE

## 2024-04-01 ENCOUNTER — Inpatient Hospital Stay: Payer: MEDICARE

## 2024-04-01 DIAGNOSIS — G35A Relapsing-remitting multiple sclerosis: Principal | ICD-10-CM

## 2024-04-21 ENCOUNTER — Inpatient Hospital Stay: Payer: MEDICARE

## 2024-04-23 ENCOUNTER — Ambulatory Visit
Admit: 2024-04-23 | Discharge: 2024-04-23 | Payer: MEDICARE | Attending: Student in an Organized Health Care Education/Training Program

## 2024-04-23 MED ORDER — BENZTROPINE 0.5 MG TABLET
0.5 | ORAL_TABLET | Freq: Two times a day (BID) | ORAL | 2 refills | Status: DC
Start: 2024-04-23 — End: 2024-05-14

## 2024-04-23 MED ORDER — BACLOFEN 10 MG TABLET
10 | ORAL_TABLET | Freq: Two times a day (BID) | ORAL | 0 refills | 30.00000 days | Status: DC
Start: 2024-04-23 — End: 2024-05-14

## 2024-04-23 NOTE — Progress Notes (Signed)
 [facility]  General Neurology Clinic    Reason for the visit: Sandra Villegas is 64 y.o. right handed female  who is here for follow up of abnormal MRI brain.    Clinical summary:  Diagnosis: Relapsing Remitting Multiple Sclerosis (RRMS)  Diagnosis code: 1 - RRMS  Disease activity in prior year: No    Date of symptom onset: August 2024  Date of diagnosis: June 2025    Clinical flare history:  Symptom Date (MM/YYYY) Notes   Right facial weakness January 2025 Brief and lasted for few hours, followed by weird feeling on the right side of the face     Treatment history:  Medication Start date (MM/YYYY) Stop date (MM/YYYY) Notes   Ublituximab  (Briumvi ) 11/09/2023 - 11/27/2023       History of present illness:  Initial history:  Patient has a history of bipolar 1 disorder, PTSD, anxiety and depression, she is followed by psychiatry and was treated with atypical antipsychotics for many years.  She was previously treated with aripiprazole and lurasidone .  She also has a history of psoriatic arthritis and was followed by rheumatology with negative autoimmune labs.    Around January 2024, patient started having plant based diet and then lost 60 lbs.    During August 2024, she started having pain in left wrist and left fingers, described as sharp and achy, intermittent and last for hours, tends to happens only when stressed, no burning sensation, numbness or tingling. She has urinary and fecal incontinence with no warning so colonoscopy was done and reportedly normal.     Around same time in August 2024, she started having memory issues with forgetfulness of names of her grandchildren, not being able to recognize her church when driving by it. She also noticed poor energy and not being able to hand every day activities.     Around January 2025, she was hospitalized with COVID, she was having palpitations during hospitalization, she then developed brief right facial weakness for few  hours and she was concerned abut stroke.  No head imaging was done during hospitalization.  Her medications for depression were adjusted and duloxetine was switched to citalopram & started on venlafaxine  and lurasidone . Afterwards, she continued to have weird feeling in right side of face with muscle twisting and biting lips but no weakness, numbness or tingling.  MRI of the brain was obtained in April 2025 which showed demyelinating lesions.    Patient does not recall having any acute episodes of vision loss, dysphagia or dysarthria, focal weakness in the past.    MRI cervical and thoracic spine in May 2025 showed 2 lesions at C2-C3 and T3-T4 that are classical for multiple sclerosis with no active enhancement.    Interval history:  History was provided by the patient, who is a fair historian. She was unaccompanied to the visit.   Vaccinations: s/p 5 COVID vaccines & 2 shingles vaccines. Not up date on Flu vaccine.  Infections: COVID URTI in January 2025. No dental infection or UTI over past 12 months.  Recent hospitalization: Right knee OA s/p knee replacement in December 2025.    Patient is using walker temporarily for support after knee surgery, she can walk without it and use back brace when standing up for a long time.   DMT: Briumvi , no side effects or infusion reaction.   - Patient was started on lurasidone  in April 2025 by psychiatry, dose of lamotrigine  is 50 mg BID & venlafaxine  XR  75 mg QD.   - MRI brain and cervical spine were stable, she couldn't tolerate MRI thoracic spine due to akathisia and restlessness.       Comprehensive review of other neurological symptoms:  Symptom Yes No Description   Cognitive impairment [x]  []  Patient forget recent and upcoming events and appointment, unable to remember without reminders and calendar.  She forget names of her grandchildren but not immediate family members.   She drives a car and has got lost while driving before, she always lose her stuff around the house  and find them eventually.   She can cook and follow a recipe, she can use laptop and cellphone with no issues, she is going through financial hardship and filling for bankruptcy.    Sleep impairment  [x]  []  She has difficulty falling asleep and staying asleep.   If sleep is interrupted, with difficulty going back to sleep.  Sleeping aids: zolpidem  & hydroxyzine     Depression/Anxiety  [x]  []  Anxiety, depression, Bipolar I, PTSD  Patient is followed by psychiatry Dr. Ladd, she was started on lurasidone  in April 2025.   Headache []  [x]     Autonomic symptoms []  [x]     Gait issues and falls [x]  []  She feel unsteady when walking on uneven surfaces, and sometimes has difficulty standing up when she fall on the floor.    Visual symptoms, including hallucinations [x]  []  Blurred vision in both eyes   Brainstem symptoms [x]  []  No diplopia. She has globus sensation with choking but no dysphagia or dysarthria  No facial weakness or numbness or tingling  No tinnitus, hearing impairment or vertigo   Motor symptoms (weakness, spasticity, cramps) [x]  []  She feel akathisia and restless when sitting and sleeping. She feel discomfort in both legs with premonitory symptoms and anxiety if not moving around.    Bowel/bladder issues []  [x]  No urinary or fecal urgency or incontinence    Sensory symptoms (Numbness/paraesthesia) [x]  []  Intermittent numbness and tinging in bilateral fingers    Tremor []  [x]        Medical Review of Systems:  As per above in HPI. A 10-point ROS is otherwise negative.     Past history:  Medical History[1]   Surgical History[2]    Social history:  Social History     Socioeconomic History    Marital status: Single     Spouse name: Not on file    Number of children: Not on file    Years of education: Not on file    Highest education level: Not on file   Occupational History    Not on file   Tobacco Use    Smoking status: Never    Smokeless tobacco: Never   Vaping Use    Vaping status: Never Used   Substance and  Sexual Activity    Alcohol use: Yes     Alcohol/week: 1.0 standard drink of alcohol     Types: 1 Glasses of wine per week     Comment: Occasional    Drug use: Never    Sexual activity: Defer   Other Topics Concern    Not on file   Social History Narrative    Not on file     Social Determinants of Health     Financial Resource Strain: Medium Risk (03/25/2024)    Overall Financial Resource Strain (CARDIA)     Difficulty of Paying Living Expenses: Somewhat hard   Food Insecurity: Unknown (03/25/2024)    Hunger Vital Sign  Worried About Programme Researcher, Broadcasting/film/video in the Last Year: Never true     [employer] of Food in the Last Year: Not on file   Transportation Needs: Unmet Transportation Needs (03/25/2024)    PRAPARE - Therapist, Art (Medical): Not on file     Lack of Transportation (Non-Medical): Yes   Physical Activity: Inactive (11/18/2018)    Received from [facility]    Exercise Vital Sign     On average, how many days per week do you engage in moderate to strenuous exercise (like a brisk walk)?: 0 days     On average, how many minutes do you engage in exercise at this level?: 0 min   Stress: Stress Concern Present (11/18/2018)    Received from [facility] - Occupational Stress Questionnaire     Feeling of Stress : To some extent   Social Connections: Moderately Integrated (11/18/2018)    Received from [facility]    Social Connection and Isolation Panel     In a typical week, how many times do you talk on the phone with family, friends, or neighbors?: More than three times a week     How often do you get together with friends or relatives?: More than three times a week     How often do you attend church or religious services?: More than 4 times per year     Do you belong to any clubs or organizations such as church groups, unions, fraternal or athletic groups, or school groups?: Yes     How often do you attend meetings of the clubs or organizations you belong  to?: More than 4 times per year     Are you married, widowed, divorced, separated, never married, or living with a partner?: Divorced   Intimate Partner Violence: Not At Risk (11/18/2018)    Received from [facility]    Humiliation, Afraid, Rape, and Kick questionnaire     Within the last year, have you been afraid of your partner or ex-partner?: No     Within the last year, have you been humiliated or emotionally abused in other ways by your partner or ex-partner?: No     Within the last year, have you been kicked, hit, slapped, or otherwise physically hurt by your partner or ex-partner?: No     Within the last year, have you been raped or forced to have any kind of sexual activity by your partner or ex-partner?: No   Housing Stability: Unknown (03/25/2024)    Housing Stability Vital Sign     Unable to Pay for Housing in the Last Year: Not on file     Number of Times Moved in the Last Year: Not on file     Homeless in the Last Year: No      Occupation: On disability for Bipolar  Highest level of education: Associates degree    Family history:   Family history of neurological diseases: Yes - Comment: sister with MS    Allergies:  Doxycycline, Lithium, Risperidone, Shellfish containing products, Strawberry, and Wellbutrin [bupropion hcl]    Current Medications[3]    Physical examination:  Vital signs: BP (!) 143/78 (BP Location: Left arm, Patient Position: Sitting, BP Cuff Size: Adult)   Pulse 109   Ht 1.651 m   Wt 108.4 kg   BMI 39.77 kg/m    General: No acute distress, well-groomed, appears stated age  Head:  Normocephalic, atraumatic.  Neck: Supple  Respiratory: Non labored breathing    Neurological exam:   Mental Status: Awake, oriented to person, place, time. Normal fund of knowledge. Fluent speech and comprehension. Able to name current and last [place]  president. Able to follow complex digiti specific commands that cross midline. Able to perform complex calculations to $0.4.     Cranial Nerves: Fundoscopy showed  no papilledema or optic disc atrophy. VA (corrected): 20/20 OU. Visual fields full to confrontation. PERRL without evidence of APD. EOMI without signs of intranuclear ophthalmoplegia or nystagmus. V1-V3 intact to light touch bilaterally. Face and nasolabial folds symmetric. Hearing intact to finger rub, palate and tongue midline. Shoulder shrug is full strength bilaterally.  Rare nonpurposeful oral buccal facial movements that can be suppressed.    Motor Exam: Bulk is normal. Tone is normal in bilateral upper & lower extremities. There is no evidence of tremor. Akathisia in all limbs. Pronator drift negative.  Strength (R/L), MRC grading 0-5:   Upper extremities: Shoulder abduction 5/5, elbow flexion 5/5, elbow extension 5/5, wrist flexion 5/5, wrist extension 5/5, finger extension 5/5, interossei 5/5  Lower extremities: Hip flexion 5/5, hip abduction 5/5, hip adduction 5/5, knee flexion 5/5, knee extension 5/5, ankle dorsiflexion 5/5, ankle plantar flexion 5/5.    Sensation: Sensation to light touch and pinprick is intact in all extremities. Vibration is mildly impaired in both feet. Proprioception intact in both both feet. Romberg is negative    Coordination: Finger-to-nose shows no signs of dysmetria. Finger-tapping is fast, fluid and synchronized.     Deep tendon reflexes: (R/L): Brachioradialis 3+/3+, Biceps 3+/3+, Triceps 3+/3+, Patellar 3+/3+ with crossed adduction, negative Hoffman sign bilaterally.     Gait: Normal based gait, normal step and stride length, no spasticity, normal cadence, and arm swing. Able to tandem walk.    EDSS: 2.5 - Minimal disability in two FS (two FS grade 2,others 0 or 1).  Pyramidal Functions: 2 - Minimal disability: patient complains of motor-fatigability or reduced performance in strenuous motor tasks (BMRC grade 4 in one or two muscle groups)  Cerebellar Functions: 0 - Normal  Sensory Functions: 0 - Normal  Brainstem Functions: 0 - Normal  Bowel & Bladder Functions: 0 -  Normal  Visual Functions: 0 - Normal  Cerebral (Cognitive) Functions: 2 - Mild decrease in mentation; moderate or severe fatigue     Test 08/20/2023 01/02/2024 04/23/2024 Date (MM/YYYY)   EDSS 2.5 2.5 2.5    SDMT 41 48 45    9-hole peg test Rt 22s, Lt 23s Rt 25.5s, Lt 24.3s Rt 23s, Lt 23.8s    25 feet walk test 4.5s 5.17s 9.5s        Relevant laboratory and imaging data (my direct review):  CSF studies (09/28/2023):  CSF protein: 37  CSF Glucose: 67  CSF WBCs (tube 4): 1  Oligoclonal bands: 5  IgG index: 1.3  Myelin basic protein: 2.2  Others: Normal CSF ACE, negative CSF VDRL, negative CSF Lyme antibodies.  Negative CSF cytology.  Insufficient sample for flow cytometry.  Negative autoimmune/paraneoplastic panel, negative MOG antibody.    Serum studies:  Test Positive Negative Not done Date (MM/YYYY) Notes   NMO antibody []  [x]  []  May 2025    MOG antibody []  [x]  []  May 2025    Autoimmune/paraneoplastic []  [x]  []  May 2025    Serum IL2R []  [x]  []  May 2025 Low level (222)   Sjogren antibodies []  [x]  []  May 2025    ANCA []  [x]  []   May 2025    ANA []  [x]  []  May 2025      Test Positive Negative Indeterminate Date (MM/YYYY) Notes   JCV antibody []  []  []      TB Quantiferon []  [x]  []  June 2025    Hepatitis panel []  [x]  []  June 2025 Negative HBs antigen, HBc antibody, HCV antibody   HSV antibodies []  []  []      VZV antibodies [x]  []  []  June 2025 Positive IgG and negative IgM   EBV panel [x]  []  []  May 2025 Prior infection   Syphilis screen []  [x]  []  May 2025    Lyme screen []  [x]  []  May 2025      Test Level Date (MM/YYYY) Notes   Vitamin D level 09 Sep 2023    Vitamin B12 level 554 May 2025 Normal MMA and homocystine     Plasma NFL 1.20 September 2023     1.52 September 2025      GFAP 36.24 September 2023     30.19 December 2023      IgG 908 May 2025 Baseline    907 September 2025        Imaging:  MRI brain WO 08/08/2023:  Several periventricular round/ovoid FLAIR hyperintensity in perivenular distribution that are consistent with CNS  demyelinating disease, there are also scattered deep white matter FLAIR hyperintensity but no juxtacortical or infratentorial lesions.    MRI cervical spine & thoracic spine W/WO 09/14/2023:  Small nonenhancing cord lesions at the C2-C3 and T3-T4 level. There is subtle heterogeneity of the remainder of the cord, though it is unclear whether these reflect tiny lesions or motion artifact.     MRI brain ICOMETRIX WO 04/01/2024:  There are mild bilateral matter lesions, a few of which are in a periventricular location which are nonspecific but are compatible with demyelinating disease given the history of multiple sclerosis.   ICOMETRIX: WBV 1499 mL (35 percentile), gray matter volume 854 mL (8 percentile), T2 lesions 4 mL    MRI cervical spine WO 04/01/2024:  1. Technically limited examination due to motion artifact.  2. One definite focal area of abnormal hyperintense T2 signal in the ventral right lateral aspect of the cord at the C2-3 disc level with a possible second abnormality seen at the C3 vertebral body level. The finding is suggestive of demyelinating disease  3. Mild degenerative disc disease at multiple levels lobes of significant central canal narrowing.    Other studies:  None    Assessment:  Sandra Villegas is 64 y.o.  right handed female who is being followed for CNS demyelinating disease.    Diagnosis discussion:  - Review of MRI brain showed several periventricular FLAIR hyperintensity that are round/ovoid with perivenular distribution that are characteristics of CNS demyelinating disease.  There are also deep white matter FLAIR hyperintensity that are round-ovoid with no corpus callosum lesions or infratentorial lesions.   - Review of MRI cervical and thoracic spine showed peripheral eccentric demyelinating lesion that are typical for multiple sclerosis.  - Patient meets 2017 McDonald criteria for relapsing remitting multiple sclerosis.  She had typical clinical presentation (right facial  weakness), symptoms were brief and no objective evidence of demyelination. Patient meets dissemination in space (periventricular, spinal cord lesions), and dissemination in time (5 oligoclonal bands). Although patient has behavioral health issues, no evidence of disability progression over the years.  - Prior workup for mimics showed negative Sjogren antibodies, NMO and MOG antibodies.  Onset of multiple sclerosis after the age of 27 is  a red flag about the diagnosis.    Management discussion:  - Patient will benefit from continuing high efficacy disease modifying therapy given spinal cord lesions, B-cell depletion therapy will be preferred treatment options since they can help prevent further relapses as well as decrease risk of progression.  - Patient meet NEDA 4 criteria after starting Briumvi  given no new symptoms or changes in neurological exam, no new or expanding lesions on MRI brain, WBV >10 percentile, with stable plasma NFL & GFAP.     Other issues:  - Low back pain and spasticity: Patient was noted to have spasticity of lumbar paraspinal muscles which is likely related to multiple sclerosis, she will be treated with baclofen  and she will benefit from Botox in the future.  - Patient has multiple psychiatric comorbidities including anxiety, depression, PTSD, bipolar 1 disorder, she was treated with atypical antipsychotics for many years, currently treated with lamotrigine  and SNRI.  - Tardive akathisia and tardive dyskinesia: These are side effects from chronic antipsychotic use, patient will benefit from benztropine  and possibly amantadine in the future.    Plan:  - DMT: Continue Briumvi  450 mg every 6 months.   - Imaging:   - Follow up MRI thoracic spine WO in January 2026.   - Repeat MRI brain ICOMETRIX WO, MRI cervical and thoracic spine WO 6 months after starting DMT (January 2027).  - Labs:  - Multiple sclerosis monitoring profile (plasma level, GFAP) every 6 months.   - Immunoglobulin panel, CBC, CMP  every 6 months  - other medications:   - Continue baclofen  10 mg twice daily for spasticity of lower back, consider Botox as next step  - Continue lamotrigine  50 mg BID, lurasidone  80 mg QD, & venlafaxine  150 mg QD (prescribed by psychiatry)  - Start benztropine  for tardive dyskinsia and akathisia.   - Continue gabapentin  to 100 mg BID & 300 mg QHS (prescribed for pain)  - Continue physical therapy to help with balance training and strengthening exercises including aquatic therapy  - Follow-up with neurology in 4-6 months    Supportive treatment:  - Vitamin D supplementation 2000 units daily and vitamin B12 supplementation 1000 mcg daily.   - Patient can use CoQ10 400 mg daily and alpha lipoic acid 600 mg twice daily, they have some benefit in metabolic supplementation to decrease risk of progression   - Patient should be aware about Uhthoff phenomenon during summer months.  - Patients with multiple sclerosis can use medical marijuana for pain and anxiety, it is recommended to use products that are third party tested.  - It is recommended for patients with multiple sclerosis to have regular exercise and eating well balanced mediterranean diet     Thank you for allowing me to participate in Reena FORBES Gander care. Please reach out to me or my office if any questions or concerns.    Sincerely,  Toya Daria Spears, MD  Neurologist, Neuroimmunologist, Neuroinfectious Diseases Specialist  [facility] [place] [facility]  [facility], [facility]   Work Phone: [phone]  Work Fax: [phone]    Dictation Software was used to dictate this note and, despite all efforts to proofread, some dictation related unintentional errors or typos may occur. If you have any questions as the referring provider colleague, please do not hesitate to contact our office. If you are the patient reading this chart and reviewing the medical notes, please note that medical documentation is often  written with abbreviations in  medical terminology. ?Physician documentation is typically written by physicians for other healthcare providers colleagues to review in order to efficiently and briefly summarize the clinical encounters, tests that were ordered and interpreted; and the diagnosis in the most efficient and effective way possible.     A total of 35 min were spent to for medical decision making and counseling. Medical Decision Making involved reviewing relevant Medical records (as available), relevant Lab results (if accessible), relevant Radiology results & images (if accessible), relevant Procedures and diagnostic tests (as applicable), independent visualization of relevant neurologic testing(as needed), all questions answered to my best up-to-date Knowledge and understanding, test discussed with the performing provider (if applicable), relevant Medical records ordered (if needed). Counseling involved counseled regarding diagnostic results, instructions for management, risk factor reductions, prognosis, patient education, impressions, importance of compliance with treatment and risks and benefits of treatment options, as indicated. Counseled regarding Tobacco/EtoH cessation and proper use and storage of opioid medications (if indicated and relevant), counseled regarding importance of compliance with treatment and risks and benefits of treatment options. Additional time was spent in additional diagnostic and management research, and relevant up-to-date info, and medical record review, as needed. Prescription Drug Monitoring Program Report: MD or Assigned Delegate Reviewed patient's report as needed prior to prescribing Schedule II, III and IV medications that required review by law. Additional time was spent in care coordination including consultation with peers, staff, schedulers, nursing, and phone collaboration; as needed         [1]   Past Medical History:  Diagnosis Date    Anxiety     Balance  problem     Brain concussion     Dementia     Depression     Fibrocystic breast     Fibromyalgia, primary     Lichen simplex chronicus     Manic bipolar I disorder     Memory loss     MS (multiple sclerosis)     Follows With [facility]    Numbness and tingling in both hands     Psoriatic arthritis     PTSD (post-traumatic stress disorder)     Snoring     Awaiting Results Of Sleep Study    SOB (shortness of breath) on exertion     Stroke     Unilateral primary osteoarthritis, right knee    [2]   Past Surgical History:  Procedure Laterality Date    BREAST BIOPSY      COLONOSCOPY      KYPHOSIS SURGERY  1987    MENISCECTOMY      MYOMECTOMY      PR TOTAL KNEE ARTHROPLASTY Right 03/25/2024    Procedure: Arthroplasty, Knee, Total;  Surgeon: Jayson JONETTA Police, MD;  Location: [facility] OR;  Service: Orthopedic Surgery   [3]   Current Outpatient Medications   Medication Sig Dispense Refill    acetaminophen  (Tylenol  Extra Strength) 500 mg tablet Take 500 mg by mouth if needed.      baclofen  (Lioresal ) 10 mg tablet Take 1 tablet (10 mg) by mouth twice daily. 180 tablet 0    bisacodyl  (Dulcolax) 5 mg EC tablet Take 5 mg by mouth twice daily. Do not crush, chew, or split.      gabapentin  (Neurontin ) 100 mg capsule Take 500 mg by mouth once daily.      hydrOXYzine  pamoate (Vistaril ) 25 mg capsule Take 25 mg by mouth.      lamoTRIgine  (LaMICtal ) 100 mg tablet Take 0.5 tablets by mouth once  daily. Take with food, with big meal at lunch; patient eats 1 full meal      lurasidone  (Latuda ) 80 mg tablet Take 80 mg by mouth in the evening.      traZODone  (Desyrel ) 50 mg tablet Take 25-50 mg by mouth at bedtime.      venlafaxine  XR (Effexor -XR) 75 mg 24 hr capsule Take 75 mg by mouth in the morning. Do not crush or chew.      zolpidem  CR (Ambien  CR) 12.5 mg ER tablet Take 12.5 mg by mouth at bedtime.       No current facility-administered medications for this visit.

## 2024-04-24 NOTE — Progress Notes (Signed)
 Sandra Villegas is a 64 y.o. female here today for sleep study f/up      + snoring hx  And weight gain  Hx  prediabetes, in the past was able to get ozempic through insurance but was discontinued.   Meds that can make her drowsy include, trazodone , baclofen , ambien , hydroxyzine , latuda .    03/25/24- had left knee arthroplasty, Dr. Brinda  In rehab after, d/c from rehab 12/22, now home  Just started with home PT.     Hx relapsing remitting Multiple sclerosis 07/2023 with MRI brain, cervical and thoracic spine showing lesions, diagnosed in 09/2023, LP testing positive, neuro is at [facility]/Dr. Toya Cherry Gburi  Tx with Bruimvi, every 6 mths  Has had shaking symptoms, new diagnosis with tardive dyskinesia  Currently also takes several psyhchiatric meds, including latuda , lamotrigine , effexor   She will be following up with psychiatry, to consider d/c of latuda  and will be starting with cogentin .      Past Surgical History:   Procedure Laterality Date    UNCODED SURGICAL HISTORY      Rectal Guaiac; documentation date: 03/11/2003; NEG X3    UTERINE FIBROID SURGERY          Family History   Problem Relation Age of Onset    COPD Mother         age at death: 28    Diabetes Mother     Substance use disorder Mother     Thyroid disease Father     Diabetes Sister     Breast cancer Paternal Grandmother     Breast cancer Unspecified     Diabetes Unspecified         Patient Active Problem List   Diagnosis    Pain in left knee    Pain in right knee    Bipolar 1 disorder    Dermatitis    Other atopic dermatitis    Psoriasis    Herpes labialis without complication    Hidradenitis suppurativa    Lichen simplex chronicus    Menopausal state    Mixed hyperlipidemia    Other fatigue    Postmenopausal vaginal bleeding    Prediabetes    PTSD (post-traumatic stress disorder)    Dysuria    Obesity due to excess calories    Enlarged thyroid    Snoring    Rash and other nonspecific skin eruption    Seasonal allergic  rhinitis due to pollen    Food allergy    Encounter for general adult medical examination with abnormal findings    Plantar wart    Screening examination for STI    Pelvic floor dysfunction in female    Incontinence of feces    COVID    Memory changes    Multiple sclerosis    Preop examination    Mild obstructive sleep apnea        Current Outpatient Medications   Medication Sig Dispense Refill Last Dispense    baclofen  (LIORESAL ) 10 MG tablet Take 10 mg by mouth daily as needed for spasm.   Unknown (patient-reported)    betamethasone dipropionate 0.05 % ointment Apply topically as needed. 45 g 2 Unknown (outside pharmacy)    cholecalciferol  (VITAMIN D3) 2,000 unit capsule Take 2,000 Units by mouth daily.   Unknown (patient-reported)    diclofenac sodium (VOLTAREN) 75 MG EC tablet Take 75 mg by mouth 2 (two) times a day.   Unknown (patient-reported)    EPINEPHrine  0.3 mg/0.3 mL auto-injector Inject 0.3 mL (  0.3 mg total) into the muscle as needed for anaphylaxis (use as directed for allergic reaction). 4 each 1 Unknown (outside pharmacy)    gabapentin  (NEURONTIN ) 100 MG capsule Take 1 capsule (100 mg total) by mouth 4 (four) times a day.   Unknown (patient-reported)    hydrOXYzine  (ATARAX ) 25 MG tablet Take 25 mg by mouth once as needed for anxiety.   Unknown (patient-reported)    ketoconazole 2 % cream QD prn 60 g 0 Unknown (outside pharmacy)    lamoTRIgine  (LAMICTAL ) 150 MG IMMEDIATE release tablet Take 150 mg by mouth daily.   Unknown (patient-reported)    lurasidone  (LATUDA ) 80 mg tablet Take 80 mg by mouth every evening.   Unknown (patient-reported)    thiamine  50 MG tablet Take 1 tablet by mouth every morning.   Unknown (patient-reported)    traZODone  (DESYREL ) 50 MG tablet Take 100 mg by mouth nightly at bedtime.   Unknown (patient-reported)    ublituximab -xiiy (BRIUMVI ) 25 mg/mL injection    Unknown (patient-reported)    venlafaxine  (EFFEXOR -XR) 150 MG 24 hr capsule Take 1 capsule by  mouth every morning.   Unknown (patient-reported)    zolpidem  (AMBIEN  CR) 12.5 MG CR tablet Take 12.5 mg by mouth nightly at bedtime as needed. (Patient taking differently: Take 12.5 mg by mouth nightly at bedtime as needed. Ins issue, status pending)   Unknown (patient-reported)     No current facility-administered medications for this visit.        Allergies   Allergen Reactions    Erythromycin Nausea and/or Vomiting, Nausea And Vomiting, Nausea Only, Unknown and Other (See Comments)     Upset stomach    Lithium Rash     Other reaction(s): worsening psoriasis    Exacerbates eczema and psoriasis    Other Reaction(s): worsening psoriasis      Other reaction(s): worsening psoriasis    Shellfish Containing Products      Other reaction(s): Rash, swelling around mouth    Bupropion      Other reaction(s): Other (See Comments)    Doxycycline Other (See Comments)     diarrhea nause vomiting     Macrolide Antibiotics Other (See Comments)     sever stomach upset per patient     Risperidone Other (See Comments)    Strawberry Unknown            Review of Systems negative unless otherwise noted.    PHYSICAL EXAM:  General: Well-appearing, in no acute distress  Speaking in full sentences, normal respirations.   Seated/ laying in bed      Lab Results   Component Value Date    GHBA1C 5.7 (H) 03/11/2024    GHBA1C 5.3 11/29/2022    GHBA1C 5.4 05/03/2022      Lab Results   Component Value Date    NA 143 03/11/2024    K 4.2 03/11/2024    CL 104 03/11/2024    CO2 31 03/11/2024    BUN 12 03/11/2024    CRE 0.92 03/11/2024    GLU 94 03/11/2024    ALB 4.4 03/11/2024    TP 6.6 03/11/2024    CA 10.4 03/11/2024    ALKP 83 03/11/2024    TBILI 0.4 03/11/2024    GLOB 2.2 03/11/2024    GFR 70 03/11/2024    ANION 8 03/11/2024     Lab Results   Component Value Date    WBC 4.6 03/11/2024    RBC 4.50 03/11/2024    HGB 12.5 03/11/2024  HCT 37.9 03/11/2024    PLT 290 03/11/2024    MCV 84.2 03/11/2024    MCH 27.8 03/11/2024    MCHC 33.0  03/11/2024    RDW 14.3 03/11/2024    MVP 9.0 (L) 03/11/2024    NRBC 0.0 03/11/2024    NEUT 60.5 03/11/2024    LYMP 19.1 (L) 03/11/2024    MON 14.9 (H) 03/11/2024    EOSP 3.7 03/11/2024    IMMGRAN 0.0 03/11/2024     Lab Results   Component Value Date    TSH3 0.90 05/03/2022        Assessment and Plan:    Mild obstructive sleep apnea (Primary)  Overview:  Sleep study done at [facility] with mild OSA, lowest O2 sats to 84%, sleep apnea index 10.5/hr  No A fib     Assessment & Plan:  Reviewed results  Maintain sleep hygiene  She will also f/up with psychiatry about medications, as takes several that increase of drowsiness  Avoid alcohol before bedtime  Sleep on side, avoid supine position  Weight mgmt, current GLP1 meds are an option, however on medicare at this time. Only option would be out of pocket.   Recent knee surgery and recovering, hence activity has been limited  Discussed referral for CPAP, mandible device mgmt/dental device.  She will f/up with her dentist regarding dental device. She feels cpap will not work as well for her as she tosses and turns a lot in her sleep        Class 2 obesity due to excess calories without serious comorbidity with body mass index (BMI) of 39.0 to 39.9 in adult  Assessment & Plan:  Reviewed options with GLP1 meds, not currently covered by medicare.   She has done sleep study testing which indicates mild OSA  Enc nutrition changes, also to start improving activity, following recent knee replacement.   Can consider metformin       Multiple sclerosis  Overview:  New diagnosis - 07/2023 with MRI brain, cervical and thoracic spine showing lesions  LP testing positive, neuro is at [facility]    Assessment & Plan:  Currently on q Bruimvi therapy  Recent note of tardive dyskinesia, likely med related as also takes many psychiatry meds, including latuda . She will seeing psychiatry for med changes, and also starting with cogentin              I have maintained a long-term, longitudinal relationship  with this patient, overseeing care of chronic conditions, including mild OSA, weight mgmt. MS. This care relationship has significantly influenced my decision-making and treatment plans during today's encounter.

## 2024-04-24 NOTE — Telephone Encounter (Signed)
 CB- pl add her for as virtual to review this for today.   We have received her report

## 2024-05-04 LAB — IGG, IGA, IGM
IgA: 151 mg/dL (ref 87–352)
IgG: 711 mg/dL (ref 586–1602)
IgM: 42 mg/dL (ref 26–217)

## 2024-05-07 ENCOUNTER — Inpatient Hospital Stay: Admit: 2024-05-07 | Payer: MEDICARE

## 2024-05-07 DIAGNOSIS — G35D Multiple sclerosis, unspecified: Principal | ICD-10-CM

## 2024-05-07 LAB — LABCORP MISCELLANEOUS

## 2024-05-08 ENCOUNTER — Ambulatory Visit: Admit: 2024-05-08 | Payer: MEDICARE

## 2024-05-14 MED ORDER — BACLOFEN 10 MG TABLET
10 | ORAL_TABLET | Freq: Two times a day (BID) | ORAL | 1 refills | 30.00000 days | Status: AC
Start: 2024-05-14 — End: 2024-11-10

## 2024-05-14 MED ORDER — BENZTROPINE 0.5 MG TABLET
0.5 | ORAL_TABLET | Freq: Two times a day (BID) | ORAL | 1 refills | Status: AC
Start: 2024-05-14 — End: ?

## 2024-05-14 NOTE — Telephone Encounter (Signed)
 I didn't know you prescribed her Gabapentin  as well. She needs a 90 day supply

## 2024-05-15 MED ORDER — CHOLECALCIFEROL (VITAMIN D3) 50 MCG (2,000 UNIT) TABLET
50 | ORAL_TABLET | Freq: Every day | ORAL | 0 refills | 30.00000 days | Status: AC
Start: 2024-05-15 — End: ?

## 2024-05-15 MED ORDER — GABAPENTIN 100 MG CAPSULE
100 | ORAL_CAPSULE | Freq: Every day | ORAL | 0 refills | 30.00000 days | Status: AC
Start: 2024-05-15 — End: 2024-08-13

## 2024-05-15 NOTE — Telephone Encounter (Signed)
 ALREADY SENT

## 2024-05-20 ENCOUNTER — Encounter: Payer: MEDICARE | Attending: Student in an Organized Health Care Education/Training Program
